# Patient Record
Sex: Male | Born: 1970 | Race: Black or African American | Hispanic: No | Smoking: Never smoker
Health system: Southern US, Community
[De-identification: ages and names within clinical notes are randomized; demographics above are authoritative.]

## PROBLEM LIST (undated history)

## (undated) DIAGNOSIS — R519 Headache, unspecified: Secondary | ICD-10-CM

## (undated) DIAGNOSIS — R569 Unspecified convulsions: Secondary | ICD-10-CM

## (undated) DIAGNOSIS — R51 Headache: Secondary | ICD-10-CM

## (undated) DIAGNOSIS — N529 Male erectile dysfunction, unspecified: Secondary | ICD-10-CM

## (undated) DIAGNOSIS — I10 Essential (primary) hypertension: Secondary | ICD-10-CM

## (undated) HISTORY — PX: NO PAST SURGERIES: SHX2092

---

## 1998-02-08 ENCOUNTER — Emergency Department (HOSPITAL_COMMUNITY): Admission: EM | Admit: 1998-02-08 | Discharge: 1998-02-08 | Payer: Self-pay | Admitting: Emergency Medicine

## 1998-03-31 ENCOUNTER — Emergency Department (HOSPITAL_COMMUNITY): Admission: EM | Admit: 1998-03-31 | Discharge: 1998-03-31 | Payer: Self-pay

## 1998-04-19 ENCOUNTER — Inpatient Hospital Stay (HOSPITAL_COMMUNITY): Admission: EM | Admit: 1998-04-19 | Discharge: 1998-04-20 | Payer: Self-pay | Admitting: Internal Medicine

## 1998-04-24 ENCOUNTER — Encounter: Admission: RE | Admit: 1998-04-24 | Discharge: 1998-07-23 | Payer: Self-pay | Admitting: Internal Medicine

## 1998-06-17 ENCOUNTER — Emergency Department (HOSPITAL_COMMUNITY): Admission: EM | Admit: 1998-06-17 | Discharge: 1998-06-17 | Payer: Self-pay | Admitting: Emergency Medicine

## 1999-11-01 ENCOUNTER — Encounter: Payer: Self-pay | Admitting: Emergency Medicine

## 1999-11-01 ENCOUNTER — Inpatient Hospital Stay (HOSPITAL_COMMUNITY): Admission: EM | Admit: 1999-11-01 | Discharge: 1999-11-05 | Payer: Self-pay | Admitting: Emergency Medicine

## 1999-11-02 ENCOUNTER — Encounter: Payer: Self-pay | Admitting: Family Medicine

## 2000-02-08 ENCOUNTER — Emergency Department (HOSPITAL_COMMUNITY): Admission: EM | Admit: 2000-02-08 | Discharge: 2000-02-08 | Payer: Self-pay | Admitting: Emergency Medicine

## 2000-03-23 ENCOUNTER — Encounter: Admission: RE | Admit: 2000-03-23 | Discharge: 2000-05-13 | Payer: Self-pay | Admitting: Orthopedic Surgery

## 2001-01-05 ENCOUNTER — Encounter: Admission: RE | Admit: 2001-01-05 | Discharge: 2001-04-05 | Payer: Self-pay | Admitting: Family Medicine

## 2001-04-23 ENCOUNTER — Emergency Department (HOSPITAL_COMMUNITY): Admission: EM | Admit: 2001-04-23 | Discharge: 2001-04-23 | Payer: Self-pay | Admitting: Emergency Medicine

## 2001-04-24 ENCOUNTER — Encounter: Payer: Self-pay | Admitting: Emergency Medicine

## 2001-04-24 ENCOUNTER — Emergency Department (HOSPITAL_COMMUNITY): Admission: EM | Admit: 2001-04-24 | Discharge: 2001-04-24 | Payer: Self-pay | Admitting: Emergency Medicine

## 2001-04-29 ENCOUNTER — Emergency Department (HOSPITAL_COMMUNITY): Admission: EM | Admit: 2001-04-29 | Discharge: 2001-04-30 | Payer: Self-pay | Admitting: Emergency Medicine

## 2001-09-04 ENCOUNTER — Emergency Department (HOSPITAL_COMMUNITY): Admission: EM | Admit: 2001-09-04 | Discharge: 2001-09-04 | Payer: Self-pay | Admitting: Emergency Medicine

## 2001-11-24 ENCOUNTER — Emergency Department (HOSPITAL_COMMUNITY): Admission: EM | Admit: 2001-11-24 | Discharge: 2001-11-24 | Payer: Self-pay | Admitting: Emergency Medicine

## 2002-11-23 ENCOUNTER — Emergency Department (HOSPITAL_COMMUNITY): Admission: EM | Admit: 2002-11-23 | Discharge: 2002-11-23 | Payer: Self-pay | Admitting: Emergency Medicine

## 2002-11-23 ENCOUNTER — Encounter: Payer: Self-pay | Admitting: Emergency Medicine

## 2004-12-11 ENCOUNTER — Encounter: Admission: RE | Admit: 2004-12-11 | Discharge: 2004-12-11 | Payer: Self-pay | Admitting: Occupational Medicine

## 2004-12-13 ENCOUNTER — Ambulatory Visit: Payer: Self-pay | Admitting: Family Medicine

## 2004-12-17 ENCOUNTER — Ambulatory Visit: Payer: Self-pay | Admitting: Family Medicine

## 2004-12-20 ENCOUNTER — Ambulatory Visit: Payer: Self-pay | Admitting: Family Medicine

## 2004-12-27 ENCOUNTER — Ambulatory Visit: Payer: Self-pay | Admitting: Family Medicine

## 2005-01-24 ENCOUNTER — Ambulatory Visit: Payer: Self-pay | Admitting: *Deleted

## 2005-01-31 ENCOUNTER — Ambulatory Visit: Payer: Self-pay | Admitting: Internal Medicine

## 2005-02-28 ENCOUNTER — Ambulatory Visit: Payer: Self-pay | Admitting: Family Medicine

## 2005-04-03 ENCOUNTER — Emergency Department (HOSPITAL_COMMUNITY): Admission: EM | Admit: 2005-04-03 | Discharge: 2005-04-03 | Payer: Self-pay | Admitting: Emergency Medicine

## 2005-07-15 ENCOUNTER — Ambulatory Visit: Payer: Self-pay | Admitting: Family Medicine

## 2005-07-17 ENCOUNTER — Encounter: Admission: RE | Admit: 2005-07-17 | Discharge: 2005-07-17 | Payer: Self-pay | Admitting: Occupational Medicine

## 2005-09-08 ENCOUNTER — Emergency Department (HOSPITAL_COMMUNITY): Admission: EM | Admit: 2005-09-08 | Discharge: 2005-09-08 | Payer: Self-pay | Admitting: *Deleted

## 2005-12-14 ENCOUNTER — Emergency Department (HOSPITAL_COMMUNITY): Admission: EM | Admit: 2005-12-14 | Discharge: 2005-12-14 | Payer: Self-pay | Admitting: Emergency Medicine

## 2006-03-30 ENCOUNTER — Ambulatory Visit: Payer: Self-pay | Admitting: Family Medicine

## 2006-08-19 ENCOUNTER — Ambulatory Visit: Payer: Self-pay | Admitting: Family Medicine

## 2006-09-10 ENCOUNTER — Ambulatory Visit: Payer: Self-pay | Admitting: Family Medicine

## 2007-03-01 ENCOUNTER — Ambulatory Visit: Payer: Self-pay | Admitting: Family Medicine

## 2007-04-08 ENCOUNTER — Ambulatory Visit: Payer: Self-pay | Admitting: Family Medicine

## 2007-07-14 ENCOUNTER — Encounter (INDEPENDENT_AMBULATORY_CARE_PROVIDER_SITE_OTHER): Payer: Self-pay | Admitting: *Deleted

## 2007-09-06 DIAGNOSIS — E1165 Type 2 diabetes mellitus with hyperglycemia: Secondary | ICD-10-CM

## 2007-09-06 DIAGNOSIS — E669 Obesity, unspecified: Secondary | ICD-10-CM | POA: Insufficient documentation

## 2007-09-06 DIAGNOSIS — I10 Essential (primary) hypertension: Secondary | ICD-10-CM

## 2007-09-06 DIAGNOSIS — G40909 Epilepsy, unspecified, not intractable, without status epilepticus: Secondary | ICD-10-CM

## 2007-10-04 ENCOUNTER — Encounter (INDEPENDENT_AMBULATORY_CARE_PROVIDER_SITE_OTHER): Payer: Self-pay | Admitting: Family Medicine

## 2007-10-04 ENCOUNTER — Ambulatory Visit: Payer: Self-pay | Admitting: Internal Medicine

## 2007-10-04 LAB — CONVERTED CEMR LAB
ALT: 37 units/L (ref 0–53)
Albumin: 4.3 g/dL (ref 3.5–5.2)
Alkaline Phosphatase: 74 units/L (ref 39–117)
Basophils Relative: 0 % (ref 0–1)
CO2: 25 meq/L (ref 19–32)
Calcium: 9.4 mg/dL (ref 8.4–10.5)
Cholesterol: 199 mg/dL (ref 0–200)
Eosinophils Absolute: 0 10*3/uL — ABNORMAL LOW (ref 0.2–0.7)
Glucose, Bld: 235 mg/dL — ABNORMAL HIGH (ref 70–99)
Lymphocytes Relative: 50 % — ABNORMAL HIGH (ref 12–46)
Monocytes Absolute: 0.3 10*3/uL (ref 0.1–1.0)
Monocytes Relative: 7 % (ref 3–12)
Potassium: 4.3 meq/L (ref 3.5–5.3)
RBC: 4.74 M/uL (ref 4.22–5.81)
RDW: 12.6 % (ref 11.5–15.5)
Sodium: 141 meq/L (ref 135–145)
Total CHOL/HDL Ratio: 5.2
Triglycerides: 181 mg/dL — ABNORMAL HIGH (ref <150)
VLDL: 36 mg/dL (ref 0–40)

## 2007-11-19 ENCOUNTER — Ambulatory Visit: Payer: Self-pay | Admitting: Internal Medicine

## 2008-02-02 ENCOUNTER — Ambulatory Visit: Payer: Self-pay | Admitting: Family Medicine

## 2008-02-02 ENCOUNTER — Encounter (INDEPENDENT_AMBULATORY_CARE_PROVIDER_SITE_OTHER): Payer: Self-pay | Admitting: Family Medicine

## 2008-02-02 LAB — CONVERTED CEMR LAB
AST: 22 units/L (ref 0–37)
Alkaline Phosphatase: 53 units/L (ref 39–117)
BUN: 12 mg/dL (ref 6–23)
Basophils Absolute: 0 10*3/uL (ref 0.0–0.1)
Basophils Relative: 0 % (ref 0–1)
Creatinine, Ser: 0.78 mg/dL (ref 0.40–1.50)
Eosinophils Absolute: 0.1 10*3/uL (ref 0.0–0.7)
Eosinophils Relative: 2 % (ref 0–5)
Lymphocytes Relative: 48 % — ABNORMAL HIGH (ref 12–46)
Lymphs Abs: 1.7 10*3/uL (ref 0.7–4.0)
MCV: 93.2 fL (ref 78.0–100.0)
Potassium: 4.6 meq/L (ref 3.5–5.3)
RDW: 13.1 % (ref 11.5–15.5)
Sodium: 141 meq/L (ref 135–145)
Total Bilirubin: 0.8 mg/dL (ref 0.3–1.2)
Total Protein: 7.8 g/dL (ref 6.0–8.3)

## 2008-11-26 ENCOUNTER — Emergency Department (HOSPITAL_COMMUNITY): Admission: EM | Admit: 2008-11-26 | Discharge: 2008-11-26 | Payer: Self-pay | Admitting: Emergency Medicine

## 2008-12-13 ENCOUNTER — Encounter (INDEPENDENT_AMBULATORY_CARE_PROVIDER_SITE_OTHER): Payer: Self-pay | Admitting: Adult Health

## 2008-12-13 ENCOUNTER — Ambulatory Visit: Payer: Self-pay | Admitting: Internal Medicine

## 2008-12-13 LAB — CONVERTED CEMR LAB
AST: 29 units/L (ref 0–37)
Albumin: 4.5 g/dL (ref 3.5–5.2)
Alkaline Phosphatase: 54 units/L (ref 39–117)
BUN: 10 mg/dL (ref 6–23)
Basophils Relative: 0 % (ref 0–1)
Chloride: 103 meq/L (ref 96–112)
Cholesterol: 146 mg/dL (ref 0–200)
Creatinine, Ser: 0.75 mg/dL (ref 0.40–1.50)
HCT: 41.9 % (ref 39.0–52.0)
Hemoglobin: 14.3 g/dL (ref 13.0–17.0)
LDL Cholesterol: 80 mg/dL (ref 0–99)
Lymphocytes Relative: 43 % (ref 12–46)
Monocytes Absolute: 0.4 10*3/uL (ref 0.1–1.0)
Neutro Abs: 1.5 10*3/uL — ABNORMAL LOW (ref 1.7–7.7)
Neutrophils Relative %: 43 % (ref 43–77)
Platelets: 271 10*3/uL (ref 150–400)
Potassium: 3.8 meq/L (ref 3.5–5.3)
RDW: 13.9 % (ref 11.5–15.5)
Total Bilirubin: 0.7 mg/dL (ref 0.3–1.2)
Total CHOL/HDL Ratio: 3.2
Triglycerides: 99 mg/dL (ref <150)
VLDL: 20 mg/dL (ref 0–40)
WBC: 3.3 10*3/uL — ABNORMAL LOW (ref 4.0–10.5)

## 2008-12-22 ENCOUNTER — Emergency Department (HOSPITAL_COMMUNITY): Admission: EM | Admit: 2008-12-22 | Discharge: 2008-12-22 | Payer: Self-pay | Admitting: *Deleted

## 2008-12-26 ENCOUNTER — Ambulatory Visit: Payer: Self-pay | Admitting: Internal Medicine

## 2009-06-14 ENCOUNTER — Encounter (INDEPENDENT_AMBULATORY_CARE_PROVIDER_SITE_OTHER): Payer: Self-pay | Admitting: Adult Health

## 2009-06-14 ENCOUNTER — Ambulatory Visit: Payer: Self-pay | Admitting: Family Medicine

## 2009-06-14 LAB — CONVERTED CEMR LAB
AST: 17 units/L (ref 0–37)
Albumin: 4.2 g/dL (ref 3.5–5.2)
Alkaline Phosphatase: 50 units/L (ref 39–117)
BUN: 13 mg/dL (ref 6–23)
Chloride: 100 meq/L (ref 96–112)
Cholesterol: 131 mg/dL (ref 0–200)
Creatinine, Ser: 0.68 mg/dL (ref 0.40–1.50)
HCT: 39 % (ref 39.0–52.0)
LDL Cholesterol: 61 mg/dL (ref 0–99)
Lymphs Abs: 1.8 10*3/uL (ref 0.7–4.0)
MCHC: 34.4 g/dL (ref 30.0–36.0)
MCV: 91.5 fL (ref 78.0–100.0)
Microalb, Ur: 1.6 mg/dL (ref 0.00–1.89)
Neutro Abs: 1.7 10*3/uL (ref 1.7–7.7)
Neutrophils Relative %: 44 % (ref 43–77)
Platelets: 240 10*3/uL (ref 150–400)
Potassium: 3.6 meq/L (ref 3.5–5.3)
Total CHOL/HDL Ratio: 3.2
VLDL: 29 mg/dL (ref 0–40)
WBC: 3.9 10*3/uL — ABNORMAL LOW (ref 4.0–10.5)

## 2010-01-07 ENCOUNTER — Emergency Department (HOSPITAL_COMMUNITY): Admission: EM | Admit: 2010-01-07 | Discharge: 2010-01-07 | Payer: Self-pay | Admitting: Emergency Medicine

## 2010-01-11 ENCOUNTER — Emergency Department (HOSPITAL_COMMUNITY): Admission: EM | Admit: 2010-01-11 | Discharge: 2010-01-12 | Payer: Self-pay | Admitting: Emergency Medicine

## 2010-03-22 ENCOUNTER — Encounter (INDEPENDENT_AMBULATORY_CARE_PROVIDER_SITE_OTHER): Payer: Self-pay | Admitting: Adult Health

## 2010-03-22 ENCOUNTER — Ambulatory Visit: Payer: Self-pay | Admitting: Internal Medicine

## 2010-03-22 LAB — CONVERTED CEMR LAB
Albumin: 4.2 g/dL (ref 3.5–5.2)
Alkaline Phosphatase: 63 units/L (ref 39–117)
BUN: 9 mg/dL (ref 6–23)
Calcium: 9.8 mg/dL (ref 8.4–10.5)
Creatinine, Ser: 0.67 mg/dL (ref 0.40–1.50)
HDL: 33 mg/dL — ABNORMAL LOW (ref >39)
LDL Cholesterol: 44 mg/dL (ref 0–99)
Microalb, Ur: 0.5 mg/dL (ref 0.00–1.89)
Phenytoin Lvl: 3.6 ug/mL — ABNORMAL LOW (ref 10.0–20.0)
Total CHOL/HDL Ratio: 3.9
Total Protein: 7.1 g/dL (ref 6.0–8.3)
Triglycerides: 266 mg/dL — ABNORMAL HIGH (ref <150)
Vit D, 25-Hydroxy: 20 ng/mL — ABNORMAL LOW (ref 30–89)

## 2010-04-09 ENCOUNTER — Ambulatory Visit: Payer: Self-pay | Admitting: Internal Medicine

## 2010-05-22 ENCOUNTER — Ambulatory Visit: Payer: Self-pay | Admitting: Internal Medicine

## 2010-10-16 ENCOUNTER — Emergency Department (HOSPITAL_COMMUNITY)
Admission: EM | Admit: 2010-10-16 | Discharge: 2010-10-16 | Payer: Self-pay | Source: Home / Self Care | Admitting: Emergency Medicine

## 2011-01-20 LAB — CBC
MCV: 93.9 fL (ref 78.0–100.0)
Platelets: 253 10*3/uL (ref 150–400)
RBC: 4.36 MIL/uL (ref 4.22–5.81)
RDW: 12.4 % (ref 11.5–15.5)
WBC: 4 10*3/uL (ref 4.0–10.5)

## 2011-01-20 LAB — DIFFERENTIAL
Lymphs Abs: 1.8 10*3/uL (ref 0.7–4.0)
Monocytes Absolute: 0.3 10*3/uL (ref 0.1–1.0)
Monocytes Relative: 8 % (ref 3–12)
Neutro Abs: 1.8 10*3/uL (ref 1.7–7.7)
Neutrophils Relative %: 45 % (ref 43–77)

## 2011-01-20 LAB — BASIC METABOLIC PANEL
BUN: 14 mg/dL (ref 6–23)
Chloride: 96 mEq/L (ref 96–112)
Creatinine, Ser: 0.79 mg/dL (ref 0.4–1.5)
GFR calc Af Amer: >60 mL/min (ref >60)
GFR calc non Af Amer: >60 mL/min (ref >60)
Potassium: 4.6 mEq/L (ref 3.5–5.1)

## 2011-01-20 LAB — BLOOD GAS, VENOUS
Acid-Base Excess: 2.6 mmol/L — ABNORMAL HIGH (ref 0.0–2.0)
O2 Saturation: 7.4 %
Patient temperature: 98.6
TCO2: 27.4 mmol/L (ref 0–100)
pCO2, Ven: 59.8 mmHg — ABNORMAL HIGH (ref 45.0–50.0)
pH, Ven: 7.321 — ABNORMAL HIGH (ref 7.250–7.300)
pO2, Ven: 11.9 mmHg — CL (ref 30.0–45.0)

## 2011-01-20 LAB — URINE CULTURE: Colony Count: NO GROWTH

## 2011-01-20 LAB — GLUCOSE, CAPILLARY
Glucose-Capillary: 247 mg/dL — ABNORMAL HIGH (ref 70–99)
Glucose-Capillary: 292 mg/dL — ABNORMAL HIGH (ref 70–99)
Glucose-Capillary: 392 mg/dL — ABNORMAL HIGH (ref 70–99)
Glucose-Capillary: 559 mg/dL (ref 70–99)

## 2011-01-20 LAB — URINALYSIS, ROUTINE W REFLEX MICROSCOPIC
Bilirubin Urine: NEGATIVE
Glucose, UA: >1000 mg/dL — AB
Glucose, UA: >1000 mg/dL — AB
Hgb urine dipstick: NEGATIVE
Hgb urine dipstick: NEGATIVE
Ketones, ur: 40 mg/dL — AB
Nitrite: NEGATIVE
Protein, ur: NEGATIVE mg/dL
Protein, ur: NEGATIVE mg/dL
Specific Gravity, Urine: 1.041 — ABNORMAL HIGH (ref 1.005–1.030)
Specific Gravity, Urine: >1.046 — ABNORMAL HIGH (ref 1.005–1.030)
Urobilinogen, UA: 0.2 mg/dL (ref 0.0–1.0)
Urobilinogen, UA: 0.2 mg/dL (ref 0.0–1.0)
pH: 5 (ref 5.0–8.0)
pH: 5.5 (ref 5.0–8.0)

## 2011-01-20 LAB — URINE MICROSCOPIC-ADD ON: Urine-Other: NONE SEEN

## 2011-01-20 LAB — POCT I-STAT, CHEM 8: Hemoglobin: 15.3 g/dL (ref 13.0–17.0)

## 2011-02-10 LAB — GLUCOSE, CAPILLARY

## 2011-02-10 LAB — KOH PREP: KOH Prep: NONE SEEN

## 2011-02-11 LAB — POCT I-STAT, CHEM 8
Calcium, Ion: 1.08 mmol/L — ABNORMAL LOW (ref 1.12–1.32)
Creatinine, Ser: 0.8 mg/dL (ref 0.4–1.5)
Glucose, Bld: 380 mg/dL — ABNORMAL HIGH (ref 70–99)
HCT: 49 % (ref 39.0–52.0)
Hemoglobin: 16.7 g/dL (ref 13.0–17.0)
TCO2: 28 mmol/L (ref 0–100)

## 2011-02-11 LAB — CBC
HCT: 46 % (ref 39.0–52.0)
MCHC: 34.6 g/dL (ref 30.0–36.0)
Platelets: 258 10*3/uL (ref 150–400)
RDW: 12.3 % (ref 11.5–15.5)

## 2011-02-11 LAB — DIFFERENTIAL
Basophils Absolute: 0 10*3/uL (ref 0.0–0.1)
Basophils Relative: 1 % (ref 0–1)
Eosinophils Relative: 1 % (ref 0–5)
Lymphocytes Relative: 53 % — ABNORMAL HIGH (ref 12–46)

## 2011-02-11 LAB — URINALYSIS, ROUTINE W REFLEX MICROSCOPIC
Bilirubin Urine: NEGATIVE
Ketones, ur: NEGATIVE mg/dL
Leukocytes, UA: NEGATIVE
Nitrite: NEGATIVE
Protein, ur: NEGATIVE mg/dL
Urobilinogen, UA: 1 mg/dL (ref 0.0–1.0)
pH: 6.5 (ref 5.0–8.0)

## 2011-02-11 LAB — KETONES, QUALITATIVE: Acetone, Bld: NEGATIVE

## 2011-03-14 NOTE — Consult Note (Signed)
NAME:  Albert Garcia, Albert Garcia NO.:  [id number]   MEDICAL RECORD NO.:  [id number]          PATIENT TYPE:  EMS   LOCATION:  ED                           FACILITY:  [facility]   PHYSICIAN:  Lindaann Slough, M.D.  DATE OF BIRTH:  1971-09-16   DATE OF CONSULTATION:  04/03/2005  DATE OF DISCHARGE:                                   CONSULTATION   REASON FOR CONSULTATION:  Left testicular pain.   The patient is a 40 year old male who has been complaining of left  testicular pain on and off for the past 4 days. The pain went away and then  it recurred 2 days ago and it was painful this morning and he went to the  emergency room for evaluation. A testicular ultrasound showed no evidence of  torsion and normal testes and epididymides. He denies any voiding symptoms.  He has no frequency, urgency, hesitancy, or straining with urination. He has  no urethral discharge.   PAST MEDICAL HISTORY:  Positive for diabetes.   FAMILY HISTORY:  Positive for heart disease, diabetes.   SOCIAL HISTORY:  He does not smoke nor drink.   MEDICATIONS:  1.  Glucotrol 1000 mg twice a day.  2.  Carbamazepine 200 mg twice a day.   ALLERGIES:  No known drug allergies.   REVIEW OF SYSTEMS:  He complains of left testicular pain and everything else  is negative.   PHYSICAL EXAMINATION:  GENERAL:  This is a well-built 40 year old male who  is complaining of left testicular pain but he is in no acute distress.  VITAL SIGNS:  Blood pressure is 126/88, pulse 82, respirations 20,  temperature 97.4.  ABDOMEN:  Soft and nondistended, nontender. He has no CVA tenderness. The  kidneys are not palpable. He has no hepatosplenomegaly, no splenomegaly. The  bladder is not distended. He has no inguinal hernia.  GENITOURINARY:  The penis is circumcised, meatus is normal. Scrotum is  normal. He has no hydrocele, no testicular mass. There is no testicular  tenderness at this time. The epididymides both right and left are  normal and  nontender. Cords are normal. There is no evidence of inguinal hernia and  anal and perineal skin is normal.   Urinalysis shows no rbc's or wbc's, 0.2 mg of urobilinogen.   Testicular ultrasound showed no evidence of torsion and normal epididymis.   IMPRESSION:  Possible intermittent torsion.   PLAN:  Ibuprofen 800 mg three times a day and follow up in the office in 1  week.       MN/MEDQ  D:  04/03/2005  T:  04/03/2005  Job:  [id number]

## 2011-08-18 ENCOUNTER — Emergency Department (HOSPITAL_COMMUNITY)
Admission: EM | Admit: 2011-08-18 | Discharge: 2011-08-18 | Disposition: A | Discharge status: Home / Self Care | Payer: Worker's Compensation | Attending: Emergency Medicine | Admitting: Emergency Medicine

## 2011-08-18 ENCOUNTER — Emergency Department (HOSPITAL_COMMUNITY): Payer: Worker's Compensation

## 2011-08-18 DIAGNOSIS — E119 Type 2 diabetes mellitus without complications: Secondary | ICD-10-CM | POA: Insufficient documentation

## 2011-08-18 DIAGNOSIS — I10 Essential (primary) hypertension: Secondary | ICD-10-CM | POA: Insufficient documentation

## 2011-08-18 DIAGNOSIS — S20219A Contusion of unspecified front wall of thorax, initial encounter: Secondary | ICD-10-CM | POA: Insufficient documentation

## 2011-08-18 DIAGNOSIS — G40909 Epilepsy, unspecified, not intractable, without status epilepticus: Secondary | ICD-10-CM | POA: Insufficient documentation

## 2011-08-18 DIAGNOSIS — Y990 Civilian activity done for income or pay: Secondary | ICD-10-CM | POA: Insufficient documentation

## 2011-08-18 DIAGNOSIS — Y9269 Other specified industrial and construction area as the place of occurrence of the external cause: Secondary | ICD-10-CM | POA: Insufficient documentation

## 2011-08-18 DIAGNOSIS — W319XXA Contact with unspecified machinery, initial encounter: Secondary | ICD-10-CM | POA: Insufficient documentation

## 2011-08-18 DIAGNOSIS — Z794 Long term (current) use of insulin: Secondary | ICD-10-CM | POA: Insufficient documentation

## 2012-06-10 ENCOUNTER — Emergency Department (HOSPITAL_BASED_OUTPATIENT_CLINIC_OR_DEPARTMENT_OTHER)
Admission: EM | Admit: 2012-06-10 | Discharge: 2012-06-10 | Disposition: A | Discharge status: Home / Self Care | Payer: Self-pay | Attending: Emergency Medicine | Admitting: Emergency Medicine

## 2012-06-10 ENCOUNTER — Encounter (HOSPITAL_BASED_OUTPATIENT_CLINIC_OR_DEPARTMENT_OTHER): Payer: Self-pay | Admitting: Emergency Medicine

## 2012-06-10 DIAGNOSIS — IMO0001 Reserved for inherently not codable concepts without codable children: Secondary | ICD-10-CM

## 2012-06-10 DIAGNOSIS — Z794 Long term (current) use of insulin: Secondary | ICD-10-CM | POA: Insufficient documentation

## 2012-06-10 DIAGNOSIS — E119 Type 2 diabetes mellitus without complications: Secondary | ICD-10-CM | POA: Insufficient documentation

## 2012-06-10 DIAGNOSIS — L03019 Cellulitis of unspecified finger: Secondary | ICD-10-CM | POA: Insufficient documentation

## 2012-06-10 HISTORY — DX: Unspecified convulsions: R56.9

## 2012-06-10 MED ORDER — LIDOCAINE HCL 2 % IJ SOLN
10.0000 mL | Freq: Once | INTRAMUSCULAR | Status: AC
  Administered 2012-06-10: 200 mg
  Filled 2012-06-10: qty 1

## 2012-06-10 NOTE — ED Notes (Signed)
Pt soaking index finger in warm soapy water.

## 2012-06-10 NOTE — ED Provider Notes (Signed)
History     CSN: [id number]  Arrival date & time 06/10/12  0810   First MD Initiated Contact with Patient 06/10/12 [phone]      Chief Complaint  Patient presents with  . Hand Pain    (Consider location/radiation/quality/duration/timing/severity/associated sxs/prior treatment) HPI Patient is a 41 year old male with history of diabetes who presents today complaining of pain along the cuticle of his right index finger. Patient noted this 7 days ago. He's had increasing pain and swelling with this. Patient has no prior history of cellulitis or boils. He does not know his blood sugar as he has not checked it for about a week. Patient reports that he has been compliant with his medications. He denies any nausea, vomiting, or fevers. Pain is a 6/10. It is worse with palpation and flexion at the affected fingers DIPs. Patient is a former Secretary/administrator. There are no other associated or modifying factors. Past Medical History  Diagnosis Date  . Diabetes mellitus   . Seizures     History reviewed. No pertinent past surgical history.  History reviewed. No pertinent family history.  History  Substance Use Topics  . Smoking status: Never Smoker   . Smokeless tobacco: Not on file  . Alcohol Use: No      Review of Systems  Constitutional: Negative.   HENT: Negative.   Eyes: Negative.   Respiratory: Negative.   Cardiovascular: Negative.   Gastrointestinal: Negative.   Genitourinary: Negative.   Musculoskeletal:       Right index finger pain  Skin: Negative.   Neurological: Negative.   Hematological: Negative.   Psychiatric/Behavioral: Negative.   All other systems reviewed and are negative.    Allergies  Review of patient's allergies indicates no known allergies.  Home Medications   Current Outpatient Rx  Name Route Sig Dispense Refill  . INSULIN ASPART 100 UNIT/ML [place] SOLN Subcutaneous Inject into the skin 3 (three) times daily before meals.    . INSULIN GLARGINE 100 UNIT/ML [place]  SOLN Subcutaneous Inject 20 Units into the skin at bedtime.    Marland Kitchen METFORMIN HCL 1000 MG PO TABS Oral Take 1,000 mg by mouth 2 (two) times daily with a meal.    . PHENYTOIN SODIUM EXTENDED 100 MG PO CAPS Oral Take 100 mg by mouth 3 (three) times daily.      BP 125/80  Pulse 81  Temp 97.9 F (36.6 C) (Oral)  Resp 16  SpO2 100%  Physical Exam  Nursing note and vitals reviewed. GEN: Well-developed, well-nourished male in no distress HEENT: Atraumatic, normocephalic. Oropharynx clear without erythema EYES: PERRLA BL, no scleral icterus. Neuro: cranial nerves grossly 2-12 intact, no abnormalities of strength or sensation, A and O x 3 MSK: Patient moves all 4 extremities symmetrically, no deformity, edema, or injury noted Skin: Over the proximal nail fold of the right index finger patient has discoloration along the cuticle with obvious swelling consistent with paronychia. Patient has no tenderness over the fingertip pad or extension to suggest felon. No other rashes, petechia, or jaundice  Psych: no abnormality of mood   ED Course  Procedures (including critical care time)  INCISION AND DRAINAGE Performed by: Cyndra Numbers Consent: Verbal consent obtained. Risks and benefits: risks, benefits and alternatives were discussed Type: abscess  Body area: Paronychia of the proximal nail fold of the right index finger  Anesthesia: Digital nerve block  Local anesthetic: lidocaine 2% without epinephrine  Anesthetic total: 3 ml  Complexity: Simple Significant pressure over the entire wound to  break up loculations Drainage: purulent  Drainage amount: 1 cc   Packing material: None  Patient tolerance: Patient tolerated the procedure well with no immediate complications.patient cleansed with chlorhexidine and soaked in warm soapy water with direct pressure applied by nursing over the affected area for 10 minutes following procedure.     Labs Reviewed  GLUCOSE, CAPILLARY - Abnormal;  Notable for the following:    Glucose-Capillary 176 (*)     All other components within normal limits   No results found.   1. Paronychia of second finger, right       MDM  Patient presented with paronychia of the right index finger. He is diabetic and has not been checking his sugars. Fingerstick was 176 today. Patient was notified of finding. Patient had incision and drainage performed. Patient was advised to perform warm soapy water soaks twice a day for 10 minutes apiece until symptoms resolved. No MRI if indicated this patient had no associated cellulitis of this. Patient advised to use ice, elevate, and use over-the-counter medications to treat pain and swelling. Patient discharged in good condition.       Cyndra Numbers, MD 06/10/12 [phone]

## 2012-06-10 NOTE — ED Notes (Signed)
Pt c/o right index finger swelling. Pt states he stopped biting his finger nails a month ago and noticed this swelling starting a week ago. Pt denies fever, nausea, vomiting. Pt states pain when touched at tip of finger.

## 2012-06-10 NOTE — ED Notes (Signed)
Pt sent home with gauze dressing on right index finger, discharge instructions reviewed, and ice pack

## 2012-06-26 ENCOUNTER — Emergency Department (HOSPITAL_BASED_OUTPATIENT_CLINIC_OR_DEPARTMENT_OTHER)
Admission: EM | Admit: 2012-06-26 | Discharge: 2012-06-26 | Disposition: A | Discharge status: Home / Self Care | Payer: Self-pay | Attending: Emergency Medicine | Admitting: Emergency Medicine

## 2012-06-26 ENCOUNTER — Encounter (HOSPITAL_BASED_OUTPATIENT_CLINIC_OR_DEPARTMENT_OTHER): Payer: Self-pay | Admitting: *Deleted

## 2012-06-26 ENCOUNTER — Emergency Department (HOSPITAL_BASED_OUTPATIENT_CLINIC_OR_DEPARTMENT_OTHER): Payer: Self-pay

## 2012-06-26 DIAGNOSIS — Y990 Civilian activity done for income or pay: Secondary | ICD-10-CM | POA: Insufficient documentation

## 2012-06-26 DIAGNOSIS — E119 Type 2 diabetes mellitus without complications: Secondary | ICD-10-CM | POA: Insufficient documentation

## 2012-06-26 DIAGNOSIS — W208XXA Other cause of strike by thrown, projected or falling object, initial encounter: Secondary | ICD-10-CM | POA: Insufficient documentation

## 2012-06-26 DIAGNOSIS — S93602A Unspecified sprain of left foot, initial encounter: Secondary | ICD-10-CM

## 2012-06-26 DIAGNOSIS — S93609A Unspecified sprain of unspecified foot, initial encounter: Secondary | ICD-10-CM | POA: Insufficient documentation

## 2012-06-26 MED ORDER — NAPROXEN 250 MG PO TABS
500.0000 mg | ORAL_TABLET | Freq: Once | ORAL | Status: AC
  Administered 2012-06-26: 500 mg via ORAL
  Filled 2012-06-26: qty 2

## 2012-06-26 MED ORDER — NAPROXEN SODIUM 550 MG PO TABS: ORAL_TABLET | ORAL | Status: DC

## 2012-06-26 NOTE — ED Notes (Signed)
Pt questioned whether he needs a UDS for his workplace. Pt states he was not sent with any paperwork, and pt attempted to contact his employer while in triage. No answer from workplace. Pt aware to notify staff once he knows whether or not he needs a UDS performed.

## 2012-06-26 NOTE — ED Provider Notes (Signed)
History     CSN: [id number]  Arrival date & time 06/26/12  0201   First MD Initiated Contact with Patient 06/26/12 0216      Chief Complaint  Patient presents with  . Foot Injury    (Consider location/radiation/quality/duration/timing/severity/associated sxs/prior treatment) HPI This is a 41 year old black male who was at work about an hour and half ago and about shampoo fell from a rack onto his left foot. He is having moderate pain in the dorsal aspect of the left foot. There is no associated deformity or swelling. The pain is worse with ambulation or palpation. He denies other injury. He is able to ambulate.  Past Medical History  Diagnosis Date  . Diabetes mellitus   . Seizures     History reviewed. No pertinent past surgical history.  No family history on file.  History  Substance Use Topics  . Smoking status: Never Smoker   . Smokeless tobacco: Not on file  . Alcohol Use: No      Review of Systems  All other systems reviewed and are negative.    Allergies  Review of patient's allergies indicates no known allergies.  Home Medications   Current Outpatient Rx  Name Route Sig Dispense Refill  . INSULIN ASPART 100 UNIT/ML [place] SOLN Subcutaneous Inject into the skin 3 (three) times daily before meals.    . INSULIN GLARGINE 100 UNIT/ML [place] SOLN Subcutaneous Inject 20 Units into the skin at bedtime.    Marland Kitchen METFORMIN HCL 1000 MG PO TABS Oral Take 1,000 mg by mouth 2 (two) times daily with a meal.    . PHENYTOIN SODIUM EXTENDED 100 MG PO CAPS Oral Take 100 mg by mouth 3 (three) times daily.      BP 114/79  Pulse 90  Temp 98.4 F (36.9 C) (Oral)  Resp 18  Ht 5\' 8"  (1.727 m)  Wt 239 lb (108.41 kg)  BMI 36.34 kg/m2  SpO2 98%  Physical Exam General: Well-developed, well-nourished male in no acute distress; appearance consistent with age of record HENT: normocephalic, atraumatic Eyes: Normal appearance Neck: supple Heart: regular rate and rhythm Lungs: Normal  respiratory effort and excursion Abdomen: soft; nondistended Extremities: No deformity; tenderness over dorsal left foot without deformity, swelling or ecchymosis, pulses normal Neurologic: Awake, alert and oriented; motor function intact in all extremities and symmetric; no facial droop Skin: Warm and dry Psychiatric: Normal mood and affect    ED Course  Procedures (including critical care time)    MDM   Nursing notes and vitals signs, including pulse oximetry, reviewed.  Summary of this visit's results, reviewed by myself:  Imaging Studies: Dg Foot Complete Left  06/26/2012  *RADIOLOGY REPORT*  Clinical Data: Foot injury  LEFT FOOT - COMPLETE 3+ VIEW  Comparison: None  Findings: There is no evidence of fracture or dislocation.  There is no evidence of arthropathy or other focal bone abnormality. Soft tissues are unremarkable.  IMPRESSION: Negative exam.   Original Report Authenticated By: Rosealee Albee, M.D.             Hanley Seamen, MD 06/26/12 [phone]

## 2012-06-26 NOTE — ED Notes (Signed)
Pt states he still does not know if he needs a UDS, and he will f/u with his employee regarding.

## 2012-06-26 NOTE — ED Notes (Signed)
Pt was at work tonight when a box fell from a rack, landing on the pt's left foot.

## 2012-08-01 ENCOUNTER — Emergency Department (HOSPITAL_BASED_OUTPATIENT_CLINIC_OR_DEPARTMENT_OTHER)
Admission: EM | Admit: 2012-08-01 | Discharge: 2012-08-01 | Disposition: A | Discharge status: Home / Self Care | Payer: Self-pay | Attending: Emergency Medicine | Admitting: Emergency Medicine

## 2012-08-01 ENCOUNTER — Encounter (HOSPITAL_BASED_OUTPATIENT_CLINIC_OR_DEPARTMENT_OTHER): Payer: Self-pay | Admitting: *Deleted

## 2012-08-01 DIAGNOSIS — E119 Type 2 diabetes mellitus without complications: Secondary | ICD-10-CM | POA: Insufficient documentation

## 2012-08-01 DIAGNOSIS — G40909 Epilepsy, unspecified, not intractable, without status epilepticus: Secondary | ICD-10-CM | POA: Insufficient documentation

## 2012-08-01 DIAGNOSIS — L0211 Cutaneous abscess of neck: Secondary | ICD-10-CM | POA: Insufficient documentation

## 2012-08-01 DIAGNOSIS — L03818 Cellulitis of other sites: Secondary | ICD-10-CM | POA: Insufficient documentation

## 2012-08-01 DIAGNOSIS — Z794 Long term (current) use of insulin: Secondary | ICD-10-CM | POA: Insufficient documentation

## 2012-08-01 DIAGNOSIS — L02818 Cutaneous abscess of other sites: Secondary | ICD-10-CM | POA: Insufficient documentation

## 2012-08-01 MED ORDER — IBUPROFEN 600 MG PO TABS: 600.0000 mg | ORAL_TABLET | Freq: Four times a day (QID) | ORAL | Status: DC | PRN

## 2012-08-01 MED ORDER — TRAMADOL HCL 50 MG PO TABS: 50.0000 mg | ORAL_TABLET | Freq: Four times a day (QID) | ORAL | Status: DC | PRN

## 2012-08-01 MED ORDER — SULFAMETHOXAZOLE-TRIMETHOPRIM 800-160 MG PO TABS: 1.0000 | ORAL_TABLET | Freq: Two times a day (BID) | ORAL | Status: DC

## 2012-08-01 MED ORDER — CEPHALEXIN 500 MG PO CAPS: 500.0000 mg | ORAL_CAPSULE | Freq: Two times a day (BID) | ORAL | Status: DC

## 2012-08-01 MED ORDER — IBUPROFEN 800 MG PO TABS
800.0000 mg | ORAL_TABLET | Freq: Once | ORAL | Status: AC
  Administered 2012-08-01: 800 mg via ORAL
  Filled 2012-08-01: qty 1

## 2012-08-01 MED ORDER — IBUPROFEN 600 MG PO TABS
600.0000 mg | ORAL_TABLET | Freq: Four times a day (QID) | ORAL | Status: DC | PRN
Start: 2012-08-01 — End: 2012-08-01

## 2012-08-01 NOTE — ED Notes (Signed)
Patient c/o HA for the past three days, has been taking 650 mg tylenoll but no relief, last taken at 23:00 last night. No n/v.  Also c/o cyst on back of neck that came up X 3 days ago, tender to touch

## 2012-08-01 NOTE — ED Provider Notes (Addendum)
History     CSN: [id number]  Arrival date & time 08/01/12  4132   First MD Initiated Contact with Patient 08/01/12 [phone]      Chief Complaint  Patient presents with  . Headache    (Consider location/radiation/quality/duration/timing/severity/associated sxs/prior treatment) HPI Pt with abscess to R occipital region of scalp with purulent drainage x 3 days. Has had increasing pain around site to neck. No fever chills. No focal neuro deficits. Has been taking tylenol at home.  Past Medical History  Diagnosis Date  . Diabetes mellitus   . Seizures     History reviewed. No pertinent past surgical history.  History reviewed. No pertinent family history.  History  Substance Use Topics  . Smoking status: Never Smoker   . Smokeless tobacco: Not on file  . Alcohol Use: Yes      Review of Systems  Constitutional: Negative for fever and chills.  HENT: Positive for neck pain. Negative for neck stiffness.   Gastrointestinal: Negative for nausea and vomiting.  Skin: Positive for rash and wound.  Neurological: Positive for headaches. Negative for seizures, weakness, light-headedness and numbness.    Allergies  Review of patient's allergies indicates no known allergies.  Home Medications   Current Outpatient Rx  Name Route Sig Dispense Refill  . CEPHALEXIN 500 MG PO CAPS Oral Take 1 capsule (500 mg total) by mouth 2 (two) times daily. 20 capsule 0  . IBUPROFEN 600 MG PO TABS Oral Take 1 tablet (600 mg total) by mouth every 6 (six) hours as needed for pain. 30 tablet 0  . INSULIN ASPART 100 UNIT/ML [place] SOLN Subcutaneous Inject into the skin 3 (three) times daily before meals.    . INSULIN GLARGINE 100 UNIT/ML [place] SOLN Subcutaneous Inject 20 Units into the skin at bedtime.    Marland Kitchen METFORMIN HCL 1000 MG PO TABS Oral Take 1,000 mg by mouth 2 (two) times daily with a meal.    . NAPROXEN SODIUM 550 MG PO TABS  Take 1 twice a day as needed for pain. Best taken with a meal. 30 tablet 0  .  PHENYTOIN SODIUM EXTENDED 100 MG PO CAPS Oral Take 100 mg by mouth 3 (three) times daily.    . SULFAMETHOXAZOLE-TRIMETHOPRIM 800-160 MG PO TABS Oral Take 1 tablet by mouth 2 (two) times daily. 20 tablet 0  . TRAMADOL HCL 50 MG PO TABS Oral Take 1 tablet (50 mg total) by mouth every 6 (six) hours as needed for pain. 15 tablet 0    BP 123/71  Pulse 91  Temp 98.1 F (36.7 C) (Oral)  Resp 19  SpO2 96%  Physical Exam  Nursing note and vitals reviewed. Constitutional: He is oriented to person, place, and time. He appears well-developed and well-nourished. No distress.  HENT:  Head: Atraumatic.  Mouth/Throat: Oropharynx is clear and moist.       1 cm follicular abscess without drainage or fluctuance located on R occipitall region of the scalp. Mild surrounding warmth and induration.   Eyes: EOM are normal. Pupils are equal, round, and reactive to light.  Neck: Normal range of motion. Neck supple.       No meningismus   Cardiovascular: Normal rate and regular rhythm.   Pulmonary/Chest: Effort normal and breath sounds normal. No respiratory distress. He has no wheezes. He has no rales.  Abdominal: Soft. Bowel sounds are normal. There is no tenderness. There is no rebound.  Musculoskeletal: Normal range of motion. He exhibits no edema and no tenderness.  Neurological: He is alert and oriented to person, place, and time. Abnormal muscle tone: move all ext without weakness, normal sensation, ambulating without assistance.  Skin: Skin is warm and dry. Rash noted. There is erythema.       See HENT exam  Psychiatric: He has a normal mood and affect. His behavior is normal.    ED Course  Procedures (including critical care time)  Labs Reviewed  GLUCOSE, CAPILLARY - Abnormal; Notable for the following:    Glucose-Capillary 237 (*)     All other components within normal limits   No results found.   1. Cellulitis and abscess of neck       MDM  Abscess recently drained. No fluctuance.  Doe snot require I&D presently. Will treat with abx. If worsens, pt instructed to return immediately.         Loren Racer, MD 08/01/12 1610  Loren Racer, MD 08/01/12 [phone]

## 2012-12-26 ENCOUNTER — Emergency Department (HOSPITAL_BASED_OUTPATIENT_CLINIC_OR_DEPARTMENT_OTHER): Payer: Self-pay

## 2012-12-26 ENCOUNTER — Emergency Department (HOSPITAL_BASED_OUTPATIENT_CLINIC_OR_DEPARTMENT_OTHER)
Admission: EM | Admit: 2012-12-26 | Discharge: 2012-12-26 | Disposition: A | Discharge status: Home / Self Care | Payer: Self-pay | Attending: Emergency Medicine | Admitting: Emergency Medicine

## 2012-12-26 ENCOUNTER — Encounter (HOSPITAL_BASED_OUTPATIENT_CLINIC_OR_DEPARTMENT_OTHER): Payer: Self-pay

## 2012-12-26 DIAGNOSIS — R51 Headache: Secondary | ICD-10-CM | POA: Insufficient documentation

## 2012-12-26 DIAGNOSIS — Z8669 Personal history of other diseases of the nervous system and sense organs: Secondary | ICD-10-CM | POA: Insufficient documentation

## 2012-12-26 DIAGNOSIS — G4700 Insomnia, unspecified: Secondary | ICD-10-CM | POA: Insufficient documentation

## 2012-12-26 DIAGNOSIS — I10 Essential (primary) hypertension: Secondary | ICD-10-CM | POA: Insufficient documentation

## 2012-12-26 DIAGNOSIS — Z794 Long term (current) use of insulin: Secondary | ICD-10-CM | POA: Insufficient documentation

## 2012-12-26 DIAGNOSIS — E119 Type 2 diabetes mellitus without complications: Secondary | ICD-10-CM | POA: Insufficient documentation

## 2012-12-26 DIAGNOSIS — Z760 Encounter for issue of repeat prescription: Secondary | ICD-10-CM | POA: Insufficient documentation

## 2012-12-26 HISTORY — DX: Essential (primary) hypertension: I10

## 2012-12-26 LAB — CBC WITH DIFFERENTIAL/PLATELET
Basophils Absolute: 0 10*3/uL (ref 0.0–0.1)
Eosinophils Absolute: 0.1 10*3/uL (ref 0.0–0.7)
Lymphocytes Relative: 53 % — ABNORMAL HIGH (ref 12–46)
Lymphs Abs: 2.2 10*3/uL (ref 0.7–4.0)
MCH: 31.6 pg (ref 26.0–34.0)
Neutrophils Relative %: 33 % — ABNORMAL LOW (ref 43–77)
Platelets: 235 10*3/uL (ref 150–400)
RBC: 4.74 MIL/uL (ref 4.22–5.81)
RDW: 12 % (ref 11.5–15.5)
WBC: 4.2 10*3/uL (ref 4.0–10.5)

## 2012-12-26 LAB — BASIC METABOLIC PANEL
Calcium: 9.5 mg/dL (ref 8.4–10.5)
GFR calc Af Amer: >90 mL/min (ref >90)
GFR calc non Af Amer: >90 mL/min (ref >90)
Glucose, Bld: 276 mg/dL — ABNORMAL HIGH (ref 70–99)
Potassium: 3.7 mEq/L (ref 3.5–5.1)
Sodium: 137 mEq/L (ref 135–145)

## 2012-12-26 MED ORDER — METOCLOPRAMIDE HCL 5 MG/ML IJ SOLN
10.0000 mg | Freq: Once | INTRAMUSCULAR | Status: AC
  Administered 2012-12-26: 10 mg via INTRAMUSCULAR
  Filled 2012-12-26: qty 2

## 2012-12-26 MED ORDER — AMLODIPINE BESYLATE 10 MG PO TABS: 10.0000 mg | ORAL_TABLET | Freq: Every day | ORAL | Status: DC

## 2012-12-26 MED ORDER — PHENYTOIN SODIUM EXTENDED 100 MG PO CAPS: 100.0000 mg | ORAL_CAPSULE | Freq: Three times a day (TID) | ORAL | Status: DC

## 2012-12-26 MED ORDER — METFORMIN HCL 500 MG PO TABS: 500.0000 mg | ORAL_TABLET | Freq: Two times a day (BID) | ORAL | Status: DC

## 2012-12-26 MED ORDER — PHENYTOIN 50 MG PO CHEW
400.0000 mg | CHEWABLE_TABLET | Freq: Three times a day (TID) | ORAL | Status: DC
  Administered 2012-12-26: 400 mg via ORAL
  Filled 2012-12-26: qty 8

## 2012-12-26 MED ORDER — DIPHENHYDRAMINE HCL 50 MG/ML IJ SOLN
12.5000 mg | Freq: Once | INTRAMUSCULAR | Status: AC
  Administered 2012-12-26: 12.5 mg via INTRAMUSCULAR
  Filled 2012-12-26: qty 1

## 2012-12-26 MED ORDER — PHENYTOIN SODIUM EXTENDED 100 MG PO CAPS
ORAL_CAPSULE | ORAL | Status: AC
  Administered 2012-12-26: 400 mg
  Filled 2012-12-26: qty 4

## 2012-12-26 MED ORDER — AMLODIPINE BESYLATE 5 MG PO TABS
10.0000 mg | ORAL_TABLET | Freq: Once | ORAL | Status: AC
  Administered 2012-12-26: 10 mg via ORAL
  Filled 2012-12-26: qty 2

## 2012-12-26 NOTE — ED Notes (Signed)
Patient transported to CT 

## 2012-12-26 NOTE — ED Notes (Signed)
I took cbg and got 266 mg/dcltr.

## 2012-12-26 NOTE — ED Notes (Signed)
Patient reports that he awoke this am with generalized headache and feeling hot all over. Was a former [facility] patient and reports no meds since [facility] closed. On assessment alert and oriented, denies trauma

## 2012-12-26 NOTE — ED Notes (Signed)
Patient received total of 400mg  of dilantin

## 2012-12-26 NOTE — ED Provider Notes (Signed)
History     CSN: [id number]  Arrival date & time 12/26/12  5784   First MD Initiated Contact with Patient 12/26/12 [phone]      Chief Complaint  Patient presents with  . Headache    (Consider location/radiation/quality/duration/timing/severity/associated sxs/prior treatment) Patient is a 42 y.o. male presenting with headaches. The history is provided by the patient.  Headache Pain location:  Frontal Quality:  Dull Radiates to:  Does not radiate Severity currently:  6/10 Severity at highest:  6/10 Onset quality:  Gradual Duration:  5 hours Timing:  Constant Progression:  Worsening Chronicity:  Recurrent Context: not activity, not exposure to bright light, not caffeine, not coughing, not defecating, not eating, not stress, not exposure to cold air, not intercourse, not loud noise and not straining   Relieved by:  Nothing Worsened by:  Nothing tried Ineffective treatments:  Aspirin Associated symptoms: no abdominal pain, no cough, no diarrhea, no dizziness, no fever, no focal weakness, no loss of balance, no myalgias, no nausea, no neck pain, no neck stiffness, no numbness, no photophobia, no seizures, no syncope, no tingling, no URI, no visual change, no vomiting and no weakness   Risk factors: insomnia   Started at work and came home and laid down and it did not get better has been off his sz meds and HTN meds for more than 5 months.   Not sudden onset not the worst headache of life.  No neuro symptoms.  No f/c/r.  No rashes on the skin Past Medical History  Diagnosis Date  . Diabetes mellitus   . Seizures   . Hypertension     History reviewed. No pertinent past surgical history.  No family history on file.  History  Substance Use Topics  . Smoking status: Never Smoker   . Smokeless tobacco: Not on file  . Alcohol Use: Yes      Review of Systems  Constitutional: Negative for fever and chills.  HENT: Negative for neck pain and neck stiffness.   Eyes: Negative for  photophobia.  Respiratory: Negative for cough and shortness of breath.   Cardiovascular: Negative for chest pain and syncope.  Gastrointestinal: Negative for nausea, vomiting, abdominal pain and diarrhea.  Musculoskeletal: Negative for myalgias.  Neurological: Positive for headaches. Negative for dizziness, focal weakness, seizures, numbness and loss of balance.  All other systems reviewed and are negative.    Allergies  Review of patient's allergies indicates no known allergies.  Home Medications   Current Outpatient Rx  Name  Route  Sig  Dispense  Refill  . insulin aspart (NOVOLOG) 100 UNIT/ML injection   Subcutaneous   Inject into the skin 3 (three) times daily before meals.           BP 181/107  Pulse 85  Temp(Src) 97.4 F (36.3 C) (Oral)  SpO2 99%  Physical Exam  Constitutional: He is oriented to person, place, and time. He appears well-developed and well-nourished. No distress.  HENT:  Head: Normocephalic and atraumatic.  Mouth/Throat: Oropharynx is clear and moist.  No temporal pain  Eyes: Conjunctivae and EOM are normal. Pupils are equal, round, and reactive to light.  Neck: Normal range of motion. Neck supple. No JVD present. No tracheal deviation present.  No meningeal signs  Cardiovascular: Normal rate, regular rhythm and intact distal pulses.   Pulmonary/Chest: Effort normal and breath sounds normal. No stridor. He has no wheezes. He has no rales.  Abdominal: Soft. Bowel sounds are normal. There is no tenderness. There is  no rebound and no guarding.  Musculoskeletal: Normal range of motion. He exhibits no edema.  Lymphadenopathy:    He has no cervical adenopathy.  Neurological: He is alert and oriented to person, place, and time. He has normal reflexes. He displays normal reflexes. No cranial nerve deficit. Coordination normal.  Skin: Skin is warm and dry.  Psychiatric: He has a normal mood and affect.    ED Course  Procedures (including critical care  time)  Labs Reviewed  GLUCOSE, CAPILLARY - Abnormal; Notable for the following:    Glucose-Capillary 266 (*)    All other components within normal limits  CBC WITH DIFFERENTIAL - Abnormal; Notable for the following:    Neutrophils Relative 33 (*)    Neutro Abs 1.4 (*)    Lymphocytes Relative 53 (*)    All other components within normal limits  BASIC METABOLIC PANEL   No results found.   No diagnosis found.    MDM  Exam reassuring will PO load with dilatin and start on antihypertensive.  Did not check Dilatin level as patient has been out of medication since prior to [facility] closing and the level will be undetectable.   No indication for LP.  Anion gap is 10 and most recent hemoglobin A1c in paperwork patient has with him is 11.  P improved post medication and HA resolved.  Return for fevers, changes in vision, weakness numbness, intractable vomiting, changes in speech or any concern.  Will refill dilating and add norvasc as patient does not recall what anti hypertensive he was taking and Will add metformin to current regimen and [facility].          Jasmine Awe, MD 12/26/12 [phone]

## 2013-01-16 ENCOUNTER — Encounter (HOSPITAL_BASED_OUTPATIENT_CLINIC_OR_DEPARTMENT_OTHER): Payer: Self-pay

## 2013-01-16 ENCOUNTER — Emergency Department (HOSPITAL_BASED_OUTPATIENT_CLINIC_OR_DEPARTMENT_OTHER)
Admission: EM | Admit: 2013-01-16 | Discharge: 2013-01-16 | Disposition: A | Discharge status: Home / Self Care | Payer: Self-pay | Attending: Emergency Medicine | Admitting: Emergency Medicine

## 2013-01-16 DIAGNOSIS — G40909 Epilepsy, unspecified, not intractable, without status epilepticus: Secondary | ICD-10-CM | POA: Insufficient documentation

## 2013-01-16 DIAGNOSIS — Z794 Long term (current) use of insulin: Secondary | ICD-10-CM | POA: Insufficient documentation

## 2013-01-16 DIAGNOSIS — L03011 Cellulitis of right finger: Secondary | ICD-10-CM

## 2013-01-16 DIAGNOSIS — E119 Type 2 diabetes mellitus without complications: Secondary | ICD-10-CM | POA: Insufficient documentation

## 2013-01-16 DIAGNOSIS — Z79899 Other long term (current) drug therapy: Secondary | ICD-10-CM | POA: Insufficient documentation

## 2013-01-16 DIAGNOSIS — Z23 Encounter for immunization: Secondary | ICD-10-CM | POA: Insufficient documentation

## 2013-01-16 DIAGNOSIS — I10 Essential (primary) hypertension: Secondary | ICD-10-CM | POA: Insufficient documentation

## 2013-01-16 DIAGNOSIS — IMO0002 Reserved for concepts with insufficient information to code with codable children: Secondary | ICD-10-CM | POA: Insufficient documentation

## 2013-01-16 MED ORDER — LIDOCAINE HCL 2 % EX GEL
CUTANEOUS | Status: AC
  Administered 2013-01-16: 20
  Filled 2013-01-16: qty 20

## 2013-01-16 MED ORDER — TETANUS-DIPHTH-ACELL PERTUSSIS 5-2.5-18.5 LF-MCG/0.5 IM SUSP
0.5000 mL | Freq: Once | INTRAMUSCULAR | Status: AC
  Administered 2013-01-16: 0.5 mL via INTRAMUSCULAR
  Filled 2013-01-16: qty 0.5

## 2013-01-16 MED ORDER — CEPHALEXIN 500 MG PO CAPS: 500.0000 mg | ORAL_CAPSULE | Freq: Four times a day (QID) | ORAL | Status: DC

## 2013-01-16 NOTE — ED Provider Notes (Addendum)
History     CSN: [id number]  Arrival date & time 01/16/13  0241   First MD Initiated Contact with Patient 01/16/13 0320      Chief Complaint  Patient presents with  . finger swelling     (Consider location/radiation/quality/duration/timing/severity/associated sxs/prior treatment) Patient is a 42 y.o. male presenting with hand pain. The history is provided by the patient.  Hand Pain This is a new problem. The current episode started more than 2 days ago. The problem occurs constantly. The problem has been gradually worsening. Nothing aggravates the symptoms. Nothing relieves the symptoms. He has tried nothing for the symptoms. The treatment provided no relief.  Bites nails and developed swelling around cuticle of the right middle finger  Past Medical History  Diagnosis Date  . Diabetes mellitus   . Seizures   . Hypertension     History reviewed. No pertinent past surgical history.  No family history on file.  History  Substance Use Topics  . Smoking status: Never Smoker   . Smokeless tobacco: Not on file  . Alcohol Use: Yes      Review of Systems  Constitutional: Negative for fever.  Skin: Negative for wound.  All other systems reviewed and are negative.    Allergies  Review of patient's allergies indicates no known allergies.  Home Medications   Current Outpatient Rx  Name  Route  Sig  Dispense  Refill  . amLODipine (NORVASC) 10 MG tablet   Oral   Take 1 tablet (10 mg total) by mouth daily.   30 tablet   0   . insulin aspart (NOVOLOG) 100 UNIT/ML injection   Subcutaneous   Inject into the skin 3 (three) times daily before meals.         . metFORMIN (GLUCOPHAGE) 500 MG tablet   Oral   Take 1 tablet (500 mg total) by mouth 2 (two) times daily with a meal.   60 tablet   0   . phenytoin (DILANTIN) 100 MG ER capsule   Oral   Take 1 capsule (100 mg total) by mouth 3 (three) times daily.   90 capsule   0     BP 148/94  Pulse 100  Temp(Src) 98  F (36.7 C)  Resp 18  SpO2 97%  Physical Exam  Constitutional: He is oriented to person, place, and time. He appears well-developed and well-nourished. No distress.  HENT:  Head: Normocephalic and atraumatic.  Eyes: Conjunctivae and EOM are normal.  Neck: Normal range of motion. Neck supple.  Cardiovascular: Normal rate, regular rhythm and intact distal pulses.   Pulmonary/Chest: Effort normal and breath sounds normal.  Abdominal: Soft. Bowel sounds are normal. There is no tenderness.  Musculoskeletal: Normal range of motion.       Hands: Neurological: He is alert and oriented to person, place, and time.  Skin: Skin is warm and dry.  Psychiatric: He has a normal mood and affect.    ED Course  Procedures (including critical care time)  Labs Reviewed - No data to display No results found.   1. Paronychia, right       MDM  Return for fevers, return of swelling streaking up the finger or any concerns.  Follow up with hand surgery for ongoing care   INCISION AND DRAINAGE Performed by: Jasmine Awe Consent: Verbal consent obtained. Risks and benefits: risks, benefits and alternatives were discussed Type: abscess  Body area: right middle finger  Incision was made with a scalpel.  Complexity: complex  Blunt dissection to break up loculations  Drainage: purulent  Drainage amount: 2 cc  Patient tolerance: Patient tolerated the procedure well with no immediate complications.       Jasmine Awe, MD 01/16/13 0327  Karma Ansley K Aaryn Sermon-Rasch, MD 01/16/13 [phone]

## 2013-01-16 NOTE — ED Notes (Signed)
Patient here with right hand middle digit swelling x 2 days. Denies trauma but reports the swelling started after biting his nails, tenderness noted around nailbed

## 2013-03-14 ENCOUNTER — Ambulatory Visit: Payer: Self-pay

## 2013-03-18 ENCOUNTER — Ambulatory Visit: Payer: Self-pay

## 2013-04-01 ENCOUNTER — Ambulatory Visit: Payer: No Typology Code available for payment source | Attending: Family Medicine | Admitting: Family Medicine

## 2013-04-01 VITALS — BP 141/89 | HR 87 | Temp 98.2°F | Resp 17 | Wt 246.0 lb

## 2013-04-01 DIAGNOSIS — R7309 Other abnormal glucose: Secondary | ICD-10-CM

## 2013-04-01 DIAGNOSIS — G40909 Epilepsy, unspecified, not intractable, without status epilepticus: Secondary | ICD-10-CM | POA: Insufficient documentation

## 2013-04-01 DIAGNOSIS — IMO0001 Reserved for inherently not codable concepts without codable children: Secondary | ICD-10-CM | POA: Insufficient documentation

## 2013-04-01 DIAGNOSIS — R739 Hyperglycemia, unspecified: Secondary | ICD-10-CM

## 2013-04-01 DIAGNOSIS — R569 Unspecified convulsions: Secondary | ICD-10-CM

## 2013-04-01 DIAGNOSIS — I10 Essential (primary) hypertension: Secondary | ICD-10-CM | POA: Insufficient documentation

## 2013-04-01 DIAGNOSIS — Z9119 Patient's noncompliance with other medical treatment and regimen: Secondary | ICD-10-CM | POA: Insufficient documentation

## 2013-04-01 DIAGNOSIS — E119 Type 2 diabetes mellitus without complications: Secondary | ICD-10-CM

## 2013-04-01 LAB — COMPREHENSIVE METABOLIC PANEL
ALT: 39 U/L (ref 0–53)
AST: 27 U/L (ref 0–37)
Albumin: 4.4 g/dL (ref 3.5–5.2)
Alkaline Phosphatase: 72 U/L (ref 39–117)
Calcium: 9.7 mg/dL (ref 8.4–10.5)
Chloride: 98 mEq/L (ref 96–112)
Potassium: 4.1 mEq/L (ref 3.5–5.3)
Sodium: 136 mEq/L (ref 135–145)
Total Protein: 7.2 g/dL (ref 6.0–8.3)

## 2013-04-01 LAB — CBC
HCT: 44.4 % (ref 39.0–52.0)
MCHC: 35.1 g/dL (ref 30.0–36.0)
Platelets: 261 10*3/uL (ref 150–400)
RDW: 13 % (ref 11.5–15.5)
WBC: 3.3 10*3/uL — ABNORMAL LOW (ref 4.0–10.5)

## 2013-04-01 LAB — POCT GLYCOSYLATED HEMOGLOBIN (HGB A1C): Hemoglobin A1C: 14

## 2013-04-01 LAB — GLUCOSE, POCT (MANUAL RESULT ENTRY): POC Glucose: 542 mg/dl — AB (ref 70–99)

## 2013-04-01 MED ORDER — METFORMIN HCL ER 500 MG PO TB24: 1000.0000 mg | ORAL_TABLET | Freq: Two times a day (BID) | ORAL | Status: DC

## 2013-04-01 MED ORDER — INSULIN ASPART 100 UNIT/ML ~~LOC~~ SOLN
14.0000 [IU] | Freq: Once | SUBCUTANEOUS | Status: AC
  Administered 2013-04-01: 14 [IU] via SUBCUTANEOUS

## 2013-04-01 MED ORDER — PHENYTOIN SODIUM EXTENDED 100 MG PO CAPS: 100.0000 mg | ORAL_CAPSULE | Freq: Three times a day (TID) | ORAL | Status: DC

## 2013-04-01 MED ORDER — LISINOPRIL-HYDROCHLOROTHIAZIDE 20-12.5 MG PO TABS: 1.0000 | ORAL_TABLET | Freq: Every day | ORAL | Status: DC

## 2013-04-01 MED ORDER — ATORVASTATIN CALCIUM 40 MG PO TABS: 40.0000 mg | ORAL_TABLET | Freq: Every day | ORAL | Status: DC

## 2013-04-01 MED ORDER — INSULIN ASPART 100 UNIT/ML ~~LOC~~ SOLN: 14.0000 [IU] | Freq: Three times a day (TID) | SUBCUTANEOUS | Status: DC

## 2013-04-01 MED ORDER — AMLODIPINE BESYLATE 10 MG PO TABS: 10.0000 mg | ORAL_TABLET | Freq: Every day | ORAL | Status: DC

## 2013-04-01 MED ORDER — INSULIN GLARGINE 100 UNIT/ML ~~LOC~~ SOLN: 40.0000 [IU] | Freq: Every day | SUBCUTANEOUS | Status: DC

## 2013-04-01 NOTE — Progress Notes (Signed)
Patient ID: Albert Garcia, male   DOB: 05-Sep-1971, 42 y.o.   MRN: [id number]  CC: establish  HPI: Pt has not seen a provider in over a year.  He has not taken any of his insulin or any of his dilantin in over 6 months.  He has symptoms of erectile dysfunction, headaches and dry mouth and frequent urination.  He says that he is willing to take his medication if he got a prescription but he has no medical insurance.  He does have an [employer] discount card.   No Known Allergies Past Medical History  Diagnosis Date  . Diabetes mellitus   . Seizures   . Hypertension    No current outpatient prescriptions on file prior to visit.   No current facility-administered medications on file prior to visit.   History reviewed. No pertinent family history. History   Social History  . Marital Status: Single    Spouse Name: N/A    Number of Children: N/A  . Years of Education: N/A   Occupational History  . Not on file.   Social History Main Topics  . Smoking status: Never Smoker   . Smokeless tobacco: Not on file  . Alcohol Use: Yes  . Drug Use: No  . Sexually Active: No   Other Topics Concern  . Not on file   Social History Narrative  . No narrative on file    Review of Systems  Constitutional: Negative for fever, chills, diaphoresis, activity change, appetite change and fatigue.  HENT: Negative for ear pain, nosebleeds, congestion, facial swelling, rhinorrhea, neck pain, neck stiffness and ear discharge.   Eyes: Negative for pain, discharge, redness, itching and visual disturbance.  Respiratory: Negative for cough, choking, chest tightness, shortness of breath, wheezing and stridor.   Cardiovascular: Negative for chest pain, palpitations and leg swelling.  Gastrointestinal: Negative for abdominal distention.  Genitourinary: Negative for dysuria, urgency, frequency, hematuria, flank pain, decreased urine volume, difficulty urinating and dyspareunia.  Musculoskeletal: Negative for back  pain, joint swelling, arthralgias and gait problem.  Neurological: Negative for dizziness, tremors, seizures, syncope, facial asymmetry, speech difficulty, weakness, light-headedness, numbness and headaches.  Hematological: Negative for adenopathy. Does not bruise/bleed easily.  Psychiatric/Behavioral: Negative for hallucinations, behavioral problems, confusion, dysphoric mood, decreased concentration and agitation.    Objective:   Filed Vitals:   04/01/13 1401  BP: 141/89  Pulse: 87  Temp: 98.2 F (36.8 C)  Resp: 17    Physical Exam  Constitutional: Appears well-developed and well-nourished. No distress.  HENT: Normocephalic. External right and left ear normal. Oropharynx is clear and moist.  Eyes: Conjunctivae and EOM are normal. PERRLA, no scleral icterus.  Neck: Normal ROM. Neck supple. No JVD. No tracheal deviation. No thyromegaly.  CVS: RRR, S1/S2 +, no murmurs, no gallops, no carotid bruit.  Pulmonary: Effort and breath sounds normal, no stridor, rhonchi, wheezes, rales.  Abdominal: Soft. BS +,  no distension, tenderness, rebound or guarding.  Musculoskeletal: Normal range of motion. No edema and no tenderness.  Lymphadenopathy: No lymphadenopathy noted, cervical, inguinal. Neuro: Alert. Normal reflexes, muscle tone coordination. No cranial nerve deficit. Skin: Skin is warm and dry. No rash noted. Not diaphoretic. No erythema. No pallor.  Psychiatric: Normal mood and affect. Behavior, judgment, thought content normal.   Lab Results  Component Value Date   WBC 4.2 12/26/2012   HGB 15.0 12/26/2012   HCT 41.7 12/26/2012   MCV 88.0 12/26/2012   PLT 235 12/26/2012   Lab Results  Component Value  Date   CREATININE 0.60 12/26/2012   BUN 11 12/26/2012   NA 137 12/26/2012   K 3.7 12/26/2012   CL 100 12/26/2012   CO2 27 12/26/2012    Lab Results  Component Value Date   HGBA1C 14.0 % 04/01/2013   Lipid Panel     Component Value Date/Time   CHOL 130 03/22/2010 2009   TRIG 266* 03/22/2010  2009   HDL 33* 03/22/2010 2009   CHOLHDL 3.9 Ratio 03/22/2010 2009   VLDL 53* 03/22/2010 2009   LDLCALC 44 03/22/2010 2009       Assessment and plan:   Patient Active Problem List   Diagnosis Date Noted  . DIABETES MELLITUS, TYPE II 09/06/2007  . OBESITY 09/06/2007  . HYPERTENSION 09/06/2007  . SEIZURE DISORDER 09/06/2007   Uncontrolled type 2 diabetes mellitus Hyperglycemia Poor medical compliance Hypertension Seizure Disorder   14 units of novolog given in office  Rx lantus 40 units every evening Novolog 14 units TIDAC Glucophage XR 1000 mg po BID with meals Zestoretic 10/12.5 Lipitor 40  Mg Dilantin 100 mg TID Monitor potassium levels  Follow up in 5 days for recheck  The patient was given clear instructions to go to ER or return to medical center if symptoms don't improve, worsen or new problems develop.  The patient verbalized understanding.  The patient was told to call to get lab results if they haven't heard anything in the next week.    Rodney Langton, MD, CDE, FAAFP [facility] [place], [place]

## 2013-04-01 NOTE — Patient Instructions (Signed)
Hypoglycemia (Low Blood Sugar) Hypoglycemia is when the glucose (sugar) in your blood is too low. Hypoglycemia can happen for many reasons. It can happen to people with or without diabetes. Hypoglycemia can develop quickly and can be a medical emergency.  CAUSES  Having hypoglycemia does not mean that you will develop diabetes. Different causes include:  Missed or delayed meals or not enough carbohydrates eaten.  Medication overdose. This could be by accident or deliberate. If by accident, your medication may need to be adjusted or changed.  Exercise or increased activity without adjustments in carbohydrates or medications.  A nerve disorder that affects body functions like your heart rate, blood pressure and digestion (autonomic neuropathy).  A condition where the stomach muscles do not function properly (gastroparesis). Therefore, medications may not absorb properly.  The inability to recognize the signs of hypoglycemia (hypoglycemic unawareness).  Absorption of insulin  may be altered.  Alcohol consumption.  Pregnancy/menstrual cycles/postpartum. This may be due to hormones.  Certain kinds of tumors. This is very rare. SYMPTOMS   Sweating.  Hunger.  Dizziness.  Blurred vision.  Drowsiness.  Weakness.  Headache.  Rapid heart beat.  Shakiness.  Nervousness. DIAGNOSIS  Diagnosis is made by monitoring blood glucose in one or all of the following ways:  Fingerstick blood glucose monitoring.  Laboratory results. TREATMENT  If you think your blood glucose is low:  Check your blood glucose, if possible. If it is less than 70 mg/dl, take one of the following:  3-4 glucose tablets.   cup juice (prefer clear like apple).   cup "regular" soda pop.  1 cup milk.  -1 tube of glucose gel.  5-6 hard candies.  Do not over treat because your blood glucose (sugar) will only go too high.  Wait 15 minutes and recheck your blood glucose. If it is still less than  70 mg/dl (or below your target range), repeat treatment.  Eat a snack if it is more than one hour until your next meal. Sometimes, your blood glucose may go so low that you are unable to treat yourself. You may need someone to help you. You may even pass out or be unable to swallow. This may require you to get an injection of glucagon, which raises the blood glucose. HOME CARE INSTRUCTIONS  Check blood glucose as recommended by your caregiver.  Take medication as prescribed by your caregiver.  Follow your meal plan. Do not skip meals. Eat on time.  If you are going to drink alcohol, drink it only with meals.  Check your blood glucose before driving.  Check your blood glucose before and after exercise. If you exercise longer or different than usual, be sure to check blood glucose more frequently.  Always carry treatment with you. Glucose tablets are the easiest to carry.  Always wear medical alert jewelry or carry some form of identification that states that you have diabetes. This will alert people that you have diabetes. If you have hypoglycemia, they will have a better idea on what to do. SEEK MEDICAL CARE IF:   You are having problems keeping your blood sugar at target range.  You are having frequent episodes of hypoglycemia.  You feel you might be having side effects from your medicines.  You have symptoms of an illness that is not improving after 3-4 days.  You notice a change in vision or a new problem with your vision. SEEK IMMEDIATE MEDICAL CARE IF:   You are a family member or friend of a   person whose blood glucose goes below 70 mg/dl and is accompanied by:  Confusion.  A change in mental status.  The inability to swallow.  Passing out. Document Released: 10/13/2005 Document Revised: 01/05/2012 Document Reviewed: 02/09/2012 [facility] Patient Information 2014 [place], [place]. Blood Sugar Monitoring, Adult GLUCOSE METERS FOR SELF-MONITORING OF BLOOD GLUCOSE  It is  important to be able to correctly measure your blood sugar (glucose). You can use a blood glucose monitor (a small battery-operated device) to check your glucose level at any time. This allows you and your caregiver to monitor your diabetes and to determine how well your treatment plan is working. The process of monitoring your blood glucose with a glucose meter is called self-monitoring of blood glucose (SMBG). When people with diabetes control their blood sugar, they have better health. To test for glucose with a typical glucose meter, place the disposable strip in the meter. Then place a small sample of blood on the "test strip." The test strip is coated with chemicals that combine with glucose in blood. The meter measures how much glucose is present. The meter displays the glucose level as a number. Several new models can record and store a number of test results. Some models can connect to personal computers to store test results or print them out.  Newer meters are often easier to use than older models. Some meters allow you to get blood from places other than your fingertip. Some new models have automatic timing, error codes, signals, or barcode readers to help with proper adjustment (calibration). Some meters have a large display screen or spoken instructions for people with visual impairments.  INSTRUCTIONS FOR USING GLUCOSE METERS  Wash your hands with soap and warm water, or clean the area with alcohol. Dry your hands completely.  Prick the side of your fingertip with a lancet (a sharp-pointed tool used by hand).  Hold the hand down and gently milk the finger until a small drop of blood appears. Catch the blood with the test strip.  Follow the instructions for inserting the test strip and using the SMBG meter. Most meters require the meter to be turned on and the test strip to be inserted before applying the blood sample.  Record the test result.  Read the instructions carefully for both  the meter and the test strips that go with it. Meter instructions are found in the user manual. Keep this manual to help you solve any problems that may arise. Many meters use "error codes" when there is a problem with the meter, the test strip, or the blood sample on the strip. You will need the manual to understand these error codes and fix the problem.  New devices are available such as laser lancets and meters that can test blood taken from "alternative sites" of the body, other than fingertips. However, you should use standard fingertip testing if your glucose changes rapidly. Also, use standard testing if:  You have eaten, exercised, or taken insulin in the past 2 hours.  You think your glucose is low.  You tend to not feel symptoms of low blood glucose (hypoglycemia).  You are ill or under stress.  Clean the meter as directed by the manufacturer.  Test the meter for accuracy as directed by the manufacturer.  Take your meter with you to your caregiver's office. This way, you can test your glucose in front of your caregiver to make sure you are using the meter correctly. Your caregiver can also take a sample of blood  to test using a routine lab method. If values on the glucose meter are close to the lab results, you and your caregiver will see that your meter is working well and you are using good technique. Your caregiver will advise you about what to do if the results do not match. FREQUENCY OF TESTING  Your caregiver will tell you how often you should check your blood glucose. This will depend on your type of diabetes, your current level of diabetes control, and your types of medicines. The following are general guidelines, but your care plan may be different. Record all your readings and the time of day you took them for review with your caregiver.   Diabetes type 1.  When you are using insulin with good diabetic control (either multiple daily injections or via a pump), you should  check your glucose 4 times a day.  If your diabetes is not well controlled, you may need to monitor more frequently, including before meals and 2 hours after meals, at bedtime, and occasionally between 2 a.m. and 3 a.m.  You should always check your glucose before a dose of insulin or before changing the rate on your insulin pump.  Diabetes type 2.  Guidelines for SMBG in diabetes type 2 are not as well defined.  If you are on insulin, follow the guidelines above.  If you are on medicines, but not insulin, and your glucose is not well controlled, you should test at least twice daily.  If you are not on insulin, and your diabetes is controlled with medicines or diet alone, you should test at least once daily, usually before breakfast.  A weekly profile will help your caregiver advise you on your care plan. The week before your visit, check your glucose before a meal and 2 hours after a meal at least daily. You may want to test before and after a different meal each day so you and your caregiver can tell how well controlled your blood sugars are throughout the course of a 24 hour period.  Gestational diabetes (diabetes during pregnancy).  Frequent testing is often necessary. Accurate timing is important.  If you are not on insulin, check your glucose 4 times a day. Check it before breakfast and 1 hour after the start of each meal.  If you are on insulin, check your glucose 6 times a day. Check it before each meal and 1 hour after the first bite of each meal.  General guidelines.  More frequent testing is required at the start of insulin treatment. Your caregiver will instruct you.  Test your glucose any time you suspect you have low blood sugar (hypoglycemia).  You should test more often when you change medicines, when you have unusual stress or illness, or in other unusual circumstances. OTHER THINGS TO KNOW ABOUT GLUCOSE METERS  Measurement Range. Most glucose meters are able to  read glucose levels over a broad range of values from as low as 0 to as high as 600 mg/dL. If you get an extremely high or low reading from your meter, you should first confirm it with another reading. Report very high or very low readings to your caregiver.  Whole Blood Glucose versus Plasma Glucose. Some older home glucose meters measure glucose in your whole blood. In a lab or when using some newer home glucose meters, the glucose is measured in your plasma (one component of blood). The difference can be important. It is important for you and your caregiver to know whether  your meter gives its results as "whole blood equivalent" or "plasma equivalent."  Display of High and Low Glucose Values. Part of learning how to operate a meter is understanding what the meter results mean. Know how high and low glucose concentrations are displayed on your meter.  Factors that Affect Glucose Meter Performance. The accuracy of your test results depends on many factors and varies depending on the brand and type of meter. These factors include:  Low red blood cell count (anemia).  Substances in your blood (such as uric acid, vitamin C, and others).  Environmental factors (temperature, humidity, altitude).  Name-brand versus generic test strips.  Calibration. Make sure your meter is set up properly. It is a good idea to do a calibration test with a control solution recommended by the manufacturer of your meter whenever you begin using a fresh bottle of test strips. This will help verify the accuracy of your meter.  Improperly stored, expired, or defective test strips. Keep your strips in a dry place with the lid on.  Soiled meter.  Inadequate blood sample. NEW TECHNOLOGIES FOR GLUCOSE TESTING Alternative site testing Some glucose meters allow testing blood from alternative sites. These include the:  Upper arm.  Forearm.  Base of the thumb.  Thigh. Sampling blood from alternative sites may be  desirable. However, it may have some limitations. Blood in the fingertips show changes in glucose levels more quickly than blood in other parts of the body. This means that alternative site test results may be different from fingertip test results, not because of the meter's ability to test accurately, but because the actual glucose concentration can be different.  Continuous Glucose Monitoring Devices to measure your blood glucose continuously are available, and others are in development. These methods can be more expensive than self-monitoring with a glucose meter. However, it is uncertain how effective and reliable these devices are. Your caregiver will advise you if this approach makes sense for you. IF BLOOD SUGARS ARE CONTROLLED, PEOPLE WITH DIABETES REMAIN HEALTHIER.  SMBG is an important part of the treatment plan of patients with diabetes mellitus. Below are reasons for using SMBG:   It confirms that your glucose is at a specific, healthy level.  It detects hypoglycemia and severe hyperglycemia.  It allows you and your caregiver to make adjustments in response to changes in lifestyle for individuals requiring medicine.  It determines the need for starting insulin therapy in temporary diabetes that happens during pregnancy (gestational diabetes). Document Released: 10/16/2003 Document Revised: 01/05/2012 Document Reviewed: 02/06/2011 [facility] Patient Information 2014 [place], [place]. Epilepsy A seizure (convulsion) is a sudden change in brain function that causes a change in behavior, muscle activity, or ability to remain awake and alert. If a person has recurring seizures, this is called epilepsy. CAUSES  Epilepsy is a disorder with many possible causes. Anything that disturbs the normal pattern of brain cell activity can lead to seizures. Seizure can be caused from illness to brain damage to abnormal brain development. Epilepsy may develop because of:  An abnormality in brain  wiring.  An imbalance of nerve signaling chemicals (neurotransmitters).  Some combination of these factors. Scientists are learning an increasing amount about genetic causes of seizures. SYMPTOMS  The symptoms of a seizure can vary greatly from one person to another. These may include:  An aura, or warning that tells a person they are about to have a seizure.  Abnormal sensations, such as abnormal smell or seeing flashing lights.  Sudden, general body stiffness.  Rhythmic jerking of the face, arm, or leg  on one or both sides.  Sudden change in consciousness.  The person may appear to be awake but not responding.  They may appear to be asleep but cannot be awakened.  Grimacing, chewing, lip smacking, or drooling.  Often there is a period of sleepiness after a seizure. DIAGNOSIS  The description you give to your caregiver about what you experienced will help them understand your problems. Equally important is the description by any witnesses to your seizure. A physical exam, including a detailed neurological exam, is necessary. An EEG (electroencephalogram) is a painless test of your brain waves. In this test a diagram is created of your brain waves. These diagrams can be interpreted by a specialist. Pictures of your brain are usually taken with:  An MRI.  A CT scan. Lab tests may be done to look for:  Signs of infection.  Abnormal blood chemistry. PREVENTION  There is no way to prevent the development of epilepsy. If you have seizures that are typically triggered by an event (such as flashing lights), try to avoid the trigger. This can help you avoid a seizure.  PROGNOSIS  Most people with epilepsy lead outwardly normal lives. While epilepsy cannot currently be cured, for some people it does eventually go away. Most seizures do not cause brain damage. It is not uncommon for people with epilepsy, especially children, to develop behavioral and emotional problems. These problems  are sometimes the consequence of medicine for seizures or social stress. For some people with epilepsy, the risk of seizures restricts their independence and recreational activities. For example, some states refuse drivers licenses to people with epilepsy. Most women with epilepsy can become pregnant. They should discuss their epilepsy and the medicine they are taking with their caregivers. Women with epilepsy have a 90 percent or better chance of having a normal, healthy baby. RISKS AND COMPLICATIONS  People with epilepsy are at increased risk of falls, accidents, and injuries. People with epilepsy are at special risk for two life-threatening conditions. These are status epilepticus and sudden unexplained death (extremely rare). Status epilepticus is a long lasting, continuous seizure that is a medical emergency. TREATMENT  Once epilepsy is diagnosed, it is important to begin treatment as soon as possible. For about 80 percent of those diagnosed with epilepsy, seizures can be controlled with modern medicines and surgical techniques. Some antiepileptic drugs can interfere with the effectiveness of oral contraceptives. In 1997, the FDA approved a pacemaker for the brain the (vagus nerve stimulator). This stimulator can be used for people with seizures that are not well-controlled by medicine. Studies have shown that in some cases, children may experience fewer seizures if they maintain a strict diet. The strict diet is called the ketogenic diet. This diet is rich in fats and low in carbohydrates. HOME CARE INSTRUCTIONS   Your caregiver will make recommendations about driving and safety in normal activities. Follow these carefully.  Take any medicine prescribed exactly as directed.  Do any blood tests requested to monitor the levels of your medicine.  The people you live and work with should know that you are prone to seizures. They should receive instructions on how to help you. In general, a witness to  a seizure should:  Cushion your head and body.  Turn you on your side.  Avoid unnecessarily restraining you.  Not place anything inside your mouth.  Call for local emergency medical help if there is any question about what has occurred.  Keep a seizure diary. Record what you recall about any seizure, especially any possible trigger.  If your caregiver has given you a follow-up appointment, it is very important to keep that appointment. Not keeping the appointment could result in permanent injury and disability. If there is any problem keeping the appointment, you must call back to this facility for assistance. SEEK MEDICAL CARE IF:   You develop signs of infection or other illness. This might increase the risk of a seizure.  You seem to be having more frequent seizures.  Your seizure pattern is changing. SEEK IMMEDIATE MEDICAL CARE IF:   A seizure does not stop after a few moments.  A seizure causes any difficulty in breathing.  A seizure results in a very severe headache.  A seizure leaves you with the inability to speak or use a part of your body. MAKE SURE YOU:   Understand these instructions.  Will watch your condition.  Will get help right away if you are not doing well or get worse. Document Released: 10/13/2005 Document Revised: 01/05/2012 Document Reviewed: 05/19/2008 [facility] Patient Information 2014 [place], [place].

## 2013-04-01 NOTE — Progress Notes (Signed)
Patient here to establish care History of DM 

## 2013-04-04 NOTE — Progress Notes (Signed)
Quick Note:  Please inform patient that his labs came back OK except that his blood sugar was very high.   Rodney Langton, MD, CDE, FAAFP [facility] [place], [place]   ______

## 2013-04-06 ENCOUNTER — Ambulatory Visit: Payer: No Typology Code available for payment source | Attending: Family Medicine | Admitting: Internal Medicine

## 2013-04-06 VITALS — BP 111/76 | HR 97 | Temp 97.6°F | Resp 16

## 2013-04-06 DIAGNOSIS — R569 Unspecified convulsions: Secondary | ICD-10-CM

## 2013-04-06 DIAGNOSIS — Z9119 Patient's noncompliance with other medical treatment and regimen: Secondary | ICD-10-CM

## 2013-04-06 DIAGNOSIS — E785 Hyperlipidemia, unspecified: Secondary | ICD-10-CM | POA: Insufficient documentation

## 2013-04-06 DIAGNOSIS — G40909 Epilepsy, unspecified, not intractable, without status epilepticus: Secondary | ICD-10-CM | POA: Insufficient documentation

## 2013-04-06 DIAGNOSIS — I10 Essential (primary) hypertension: Secondary | ICD-10-CM

## 2013-04-06 DIAGNOSIS — Z91199 Patient's noncompliance with other medical treatment and regimen due to unspecified reason: Secondary | ICD-10-CM

## 2013-04-06 DIAGNOSIS — E119 Type 2 diabetes mellitus without complications: Secondary | ICD-10-CM

## 2013-04-06 DIAGNOSIS — IMO0001 Reserved for inherently not codable concepts without codable children: Secondary | ICD-10-CM

## 2013-04-06 MED ORDER — GLUCOSE BLOOD VI STRP: ORAL_STRIP | Status: DC

## 2013-04-06 NOTE — Progress Notes (Signed)
Patient ID: Albert Garcia, male   DOB: 08-Apr-1971, 42 y.o.   MRN: [id number] Patient Demographics  Albert Garcia, is a 42 y.o. male  [id number]  [id number]  DOB - 06-16-1971  Chief Complaint  Patient presents with  . Follow-up        Subjective:   Albert Garcia today is here for a follow up visit. He was seen last week in the clinic and told to followup today for [place] to review his Accu-Cheks at home. He was just started on 40 units of Lantus each bedtime, 20 units of NovoLog with meals, 1000 mg of metformin twice a day and 4 mg of Amaryl. He unfortunately did not check his sugars at home at all, his last sugar check is here in the clinic last week, he claims that he did not buy the insulin test strips. He was also restarted on Dilantin, however he just started taking them today.   Patient has No headache, No chest pain, No abdominal pain - No Nausea, No new weakness tingling or numbness, No Cough - SOB.  Objective:    Filed Vitals:   04/06/13 1725  BP: 111/76  Pulse: 97  Temp: 97.6 F (36.4 C)  Resp: 16  SpO2: 95%     ALLERGIES:  No Known Allergies  PAST MEDICAL HISTORY: Past Medical History  Diagnosis Date  . Diabetes mellitus   . Seizures   . Hypertension     MEDICATIONS AT HOME: Prior to Admission medications   Medication Sig Start Date End Date Taking? Authorizing Provider  amLODipine (NORVASC) 10 MG tablet Take 1 tablet (10 mg total) by mouth daily. 04/01/13   Clanford Cyndie Mull, MD  aspirin EC 325 MG tablet Take 325 mg by mouth daily.    Historical Provider, MD  atorvastatin (LIPITOR) 40 MG tablet Take 1 tablet (40 mg total) by mouth daily. 04/01/13   Clanford Cyndie Mull, MD  glimepiride (AMARYL) 4 MG tablet Take 4 mg by mouth daily before breakfast.    Historical Provider, MD  glucose blood (ACCU-CHEK ACTIVE STRIPS) test strip Use as instructed 04/06/13   Maretta Bees, MD  insulin aspart (NOVOLOG) 100 UNIT/ML injection Inject 14 Units into the skin 3  (three) times daily before meals. 04/01/13   Clanford Cyndie Mull, MD  insulin glargine (LANTUS) 100 UNIT/ML injection Inject 0.4 mLs (40 Units total) into the skin at bedtime. 04/01/13   Clanford Cyndie Mull, MD  lisinopril-hydrochlorothiazide (PRINZIDE,ZESTORETIC) 20-12.5 MG per tablet Take 1 tablet by mouth daily. 04/01/13   Clanford Cyndie Mull, MD  metFORMIN (GLUCOPHAGE XR) 500 MG 24 hr tablet Take 2 tablets (1,000 mg total) by mouth 2 (two) times daily with a meal. 04/01/13   Clanford Cyndie Mull, MD  phenytoin (DILANTIN) 100 MG ER capsule Take 1 capsule (100 mg total) by mouth 3 (three) times daily. 04/01/13   Clanford Cyndie Mull, MD  sildenafil (VIAGRA) 50 MG tablet Take 50 mg by mouth daily as needed for erectile dysfunction.    Historical Provider, MD     Exam  General appearance :Awake, alert, not in any distress. Speech Clear. Not toxic Looking HEENT: Atraumatic and Normocephalic, pupils equally reactive to light and accomodation Neck: supple, no JVD. No cervical lymphadenopathy.  Chest:Good air entry bilaterally, no added sounds  CVS: S1 S2 regular, no murmurs.  Abdomen: Bowel sounds present, Non tender and not distended with no gaurding, rigidity or rebound. Extremities: B/L Lower Ext shows no edema, both legs are warm to touch  Neurology: Awake alert, and oriented X 3, CN II-XII intact, Non focal Skin:No Rash Wounds:N/A    Data Review   CBC  Recent Labs Lab 04/01/13 1446  WBC 3.3*  HGB 15.6  HCT 44.4  PLT 261  MCV 90.4  MCH 31.8  MCHC 35.1  RDW 13.0    Chemistries    Recent Labs Lab 04/01/13 1446  NA 136  K 4.1  CL 98  CO2 25  GLUCOSE 489*  BUN 10  CREATININE 1.02  CALCIUM 9.7  AST 27  ALT 39  ALKPHOS 72  BILITOT 0.6   ------------------------------------------------------------------------------------------------------------------ No results found for this basename: HGBA1C,  in the last 72  hours ------------------------------------------------------------------------------------------------------------------ No results found for this basename: CHOL, HDL, LDLCALC, TRIG, CHOLHDL, LDLDIRECT,  in the last 72 hours ------------------------------------------------------------------------------------------------------------------ No results found for this basename: TSH, T4TOTAL, FREET3, T3FREE, THYROIDAB,  in the last 72 hours ------------------------------------------------------------------------------------------------------------------ No results found for this basename: VITAMINB12, FOLATE, FERRITIN, TIBC, IRON, RETICCTPCT,  in the last 72 hours  Coagulation profile  No results found for this basename: INR, PROTIME,  in the last 168 hours    Assessment & Plan   Uncontrolled diabetes - Check CBG - Have counseled patient extensively that we need to make sure that he starts taking his sugars so we can start adjusting his insulin regimen, I have explained to him the importance of monitoring her sugars to prevent life-threatening hypoglycemic episodes and to further optimize glycemic control so we can prevent him from having long-term complications of diabetes. - Will provide him with a prescription for accu strips - Will bring him back in one week for another repeat visit to see if we can start adjusting his insulin regimen. - I have asked him to keep a record of his CBG readings so that he can bring it with him for his next visit - In the meantime, continue with current insulin regimen along with metformin and Amaryl  Hypertension - Controlled to continue with current regimen  Dyslipidemia - Continue with statin  Seizure disorder - Continue with the Dilantin - He just started taking it today (6/11) - Will need to check a Dilantin level at some point.  Followup in one week- please check his CBG readings and adjust insulin dose accordingly.

## 2013-04-06 NOTE — Progress Notes (Signed)
Patient here for follow up DM 

## 2013-06-21 ENCOUNTER — Other Ambulatory Visit: Payer: Self-pay | Admitting: Internal Medicine

## 2013-06-21 MED ORDER — QUINAPRIL-HYDROCHLOROTHIAZIDE 20-12.5 MG PO TABS: 1.0000 | ORAL_TABLET | Freq: Every day | ORAL | Status: DC

## 2013-06-21 MED ORDER — ROSUVASTATIN CALCIUM 20 MG PO TABS: 20.0000 mg | ORAL_TABLET | Freq: Every day | ORAL | Status: DC

## 2013-07-07 ENCOUNTER — Ambulatory Visit: Payer: Self-pay

## 2013-08-30 ENCOUNTER — Ambulatory Visit: Payer: No Typology Code available for payment source | Attending: Internal Medicine | Admitting: Internal Medicine

## 2013-08-30 VITALS — BP 136/91 | HR 104 | Temp 98.6°F | Resp 16 | Ht 69.0 in | Wt 246.6 lb

## 2013-08-30 DIAGNOSIS — R739 Hyperglycemia, unspecified: Secondary | ICD-10-CM

## 2013-08-30 DIAGNOSIS — I10 Essential (primary) hypertension: Secondary | ICD-10-CM | POA: Insufficient documentation

## 2013-08-30 DIAGNOSIS — E785 Hyperlipidemia, unspecified: Secondary | ICD-10-CM | POA: Insufficient documentation

## 2013-08-30 DIAGNOSIS — IMO0001 Reserved for inherently not codable concepts without codable children: Secondary | ICD-10-CM | POA: Insufficient documentation

## 2013-08-30 DIAGNOSIS — R7309 Other abnormal glucose: Secondary | ICD-10-CM

## 2013-08-30 DIAGNOSIS — E1059 Type 1 diabetes mellitus with other circulatory complications: Secondary | ICD-10-CM

## 2013-08-30 DIAGNOSIS — Z9119 Patient's noncompliance with other medical treatment and regimen: Secondary | ICD-10-CM

## 2013-08-30 MED ORDER — QUINAPRIL-HYDROCHLOROTHIAZIDE 20-12.5 MG PO TABS: 1.0000 | ORAL_TABLET | Freq: Every day | ORAL | Status: DC

## 2013-08-30 MED ORDER — INSULIN GLARGINE 100 UNIT/ML SOLOSTAR PEN: 30.0000 [IU] | PEN_INJECTOR | Freq: Every day | SUBCUTANEOUS | Status: DC

## 2013-08-30 MED ORDER — INSULIN ASPART 100 UNIT/ML ~~LOC~~ SOLN: 15.0000 [IU] | Freq: Once | SUBCUTANEOUS | Status: DC

## 2013-08-30 MED ORDER — INSULIN ASPART 100 UNIT/ML ~~LOC~~ SOLN
15.0000 [IU] | Freq: Once | SUBCUTANEOUS | Status: AC
  Administered 2013-08-30: 15 [IU] via SUBCUTANEOUS

## 2013-08-30 MED ORDER — PHENYTOIN SODIUM EXTENDED 100 MG PO CAPS: 100.0000 mg | ORAL_CAPSULE | Freq: Three times a day (TID) | ORAL | Status: DC

## 2013-08-30 MED ORDER — GLIMEPIRIDE 4 MG PO TABS: 4.0000 mg | ORAL_TABLET | Freq: Every day | ORAL | Status: DC

## 2013-08-30 MED ORDER — GLUCOSE BLOOD VI STRP: ORAL_STRIP | Status: DC

## 2013-08-30 MED ORDER — METFORMIN HCL ER 500 MG PO TB24: 1000.0000 mg | ORAL_TABLET | Freq: Two times a day (BID) | ORAL | Status: DC

## 2013-08-30 MED ORDER — ASPIRIN EC 325 MG PO TBEC: 325.0000 mg | DELAYED_RELEASE_TABLET | Freq: Every day | ORAL | Status: DC

## 2013-08-30 MED ORDER — FREESTYLE SYSTEM KIT: 1.0000 | PACK | Status: DC | PRN

## 2013-08-30 MED ORDER — INSULIN ASPART 100 UNIT/ML ~~LOC~~ SOLN: 14.0000 [IU] | Freq: Three times a day (TID) | SUBCUTANEOUS | Status: DC

## 2013-08-30 MED ORDER — ROSUVASTATIN CALCIUM 20 MG PO TABS: 20.0000 mg | ORAL_TABLET | Freq: Every day | ORAL | Status: DC
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
## 2013-08-30 NOTE — Progress Notes (Signed)
Pt has a cc of a full physical, BP check and glucose reading; glucose reading of 529. Pt is Type II Diabetic and has hypertension. Has not been taking current medication prescribed for 3-4 weeks. Pt experiences high stress and requests diabetes education. Pt has no known allergies.

## 2013-08-30 NOTE — Patient Instructions (Signed)
Please check her sugars 3-4 times a day, make a record of it and bring it to your next followup appointment.

## 2013-08-30 NOTE — Addendum Note (Signed)
Addended by: Maretta Bees on: 08/30/2013 02:14 PM   Modules accepted: Orders

## 2013-09-26 ENCOUNTER — Ambulatory Visit: Payer: Self-pay | Admitting: *Deleted

## 2013-11-18 ENCOUNTER — Other Ambulatory Visit: Payer: Self-pay | Admitting: Emergency Medicine

## 2013-11-18 MED ORDER — INSULIN ASPART 100 UNIT/ML ~~LOC~~ SOLN: 14.0000 [IU] | Freq: Three times a day (TID) | SUBCUTANEOUS | Status: DC

## 2013-11-29 ENCOUNTER — Emergency Department (HOSPITAL_BASED_OUTPATIENT_CLINIC_OR_DEPARTMENT_OTHER)
Admission: EM | Admit: 2013-11-29 | Discharge: 2013-11-29 | Disposition: A | Discharge status: Home / Self Care | Payer: Self-pay | Attending: Emergency Medicine | Admitting: Emergency Medicine

## 2013-11-29 ENCOUNTER — Other Ambulatory Visit: Payer: Self-pay | Admitting: Internal Medicine

## 2013-11-29 ENCOUNTER — Encounter (HOSPITAL_BASED_OUTPATIENT_CLINIC_OR_DEPARTMENT_OTHER): Payer: Self-pay | Admitting: Emergency Medicine

## 2013-11-29 DIAGNOSIS — G5690 Unspecified mononeuropathy of unspecified upper limb: Secondary | ICD-10-CM | POA: Insufficient documentation

## 2013-11-29 DIAGNOSIS — Z7982 Long term (current) use of aspirin: Secondary | ICD-10-CM | POA: Insufficient documentation

## 2013-11-29 DIAGNOSIS — I10 Essential (primary) hypertension: Secondary | ICD-10-CM | POA: Insufficient documentation

## 2013-11-29 DIAGNOSIS — L03114 Cellulitis of left upper limb: Secondary | ICD-10-CM

## 2013-11-29 DIAGNOSIS — E119 Type 2 diabetes mellitus without complications: Secondary | ICD-10-CM | POA: Insufficient documentation

## 2013-11-29 DIAGNOSIS — IMO0002 Reserved for concepts with insufficient information to code with codable children: Secondary | ICD-10-CM | POA: Insufficient documentation

## 2013-11-29 DIAGNOSIS — G40909 Epilepsy, unspecified, not intractable, without status epilepticus: Secondary | ICD-10-CM | POA: Insufficient documentation

## 2013-11-29 DIAGNOSIS — Z794 Long term (current) use of insulin: Secondary | ICD-10-CM | POA: Insufficient documentation

## 2013-11-29 LAB — CBC WITH DIFFERENTIAL/PLATELET
Basophils Absolute: 0 10*3/uL (ref 0.0–0.1)
Basophils Relative: 0 % (ref 0–1)
Eosinophils Absolute: 0 10*3/uL (ref 0.0–0.7)
Eosinophils Relative: 1 % (ref 0–5)
HCT: 37.6 % — ABNORMAL LOW (ref 39.0–52.0)
Hemoglobin: 13.4 g/dL (ref 13.0–17.0)
Lymphocytes Relative: 34 % (ref 12–46)
Lymphs Abs: 2.1 10*3/uL (ref 0.7–4.0)
MCH: 32.8 pg (ref 26.0–34.0)
MCHC: 35.6 g/dL (ref 30.0–36.0)
MCV: 91.9 fL (ref 78.0–100.0)
Monocytes Absolute: 0.5 10*3/uL (ref 0.1–1.0)
Monocytes Relative: 8 % (ref 3–12)
Neutro Abs: 3.5 10*3/uL (ref 1.7–7.7)
Neutrophils Relative %: 57 % (ref 43–77)
Platelets: 238 10*3/uL (ref 150–400)
RBC: 4.09 MIL/uL — ABNORMAL LOW (ref 4.22–5.81)
RDW: 12.1 % (ref 11.5–15.5)
WBC: 6.1 10*3/uL (ref 4.0–10.5)

## 2013-11-29 LAB — COMPREHENSIVE METABOLIC PANEL WITH GFR
ALT: 38 U/L (ref 0–53)
AST: 27 U/L (ref 0–37)
Albumin: 3.9 g/dL (ref 3.5–5.2)
Alkaline Phosphatase: 61 U/L (ref 39–117)
BUN: 12 mg/dL (ref 6–23)
CO2: 27 meq/L (ref 19–32)
Calcium: 9.5 mg/dL (ref 8.4–10.5)
Chloride: 102 meq/L (ref 96–112)
Creatinine, Ser: 0.7 mg/dL (ref 0.50–1.35)
GFR calc Af Amer: >90 mL/min
GFR calc non Af Amer: >90 mL/min
Glucose, Bld: 256 mg/dL — ABNORMAL HIGH (ref 70–99)
Potassium: 4 meq/L (ref 3.7–5.3)
Sodium: 142 meq/L (ref 137–147)
Total Bilirubin: <0.2 mg/dL — ABNORMAL LOW (ref 0.3–1.2)
Total Protein: 7.5 g/dL (ref 6.0–8.3)

## 2013-11-29 MED ORDER — CEPHALEXIN 500 MG PO CAPS: 500.0000 mg | ORAL_CAPSULE | Freq: Four times a day (QID) | ORAL | Status: DC

## 2013-11-29 MED ORDER — CEFTRIAXONE SODIUM 1 G IJ SOLR
INTRAMUSCULAR | Status: AC
  Filled 2013-11-29: qty 10

## 2013-11-29 MED ORDER — HYDROCODONE-ACETAMINOPHEN 5-325 MG PO TABS: 2.0000 | ORAL_TABLET | ORAL | Status: DC | PRN

## 2013-11-29 MED ORDER — SULFAMETHOXAZOLE-TRIMETHOPRIM 800-160 MG PO TABS: 1.0000 | ORAL_TABLET | Freq: Two times a day (BID) | ORAL | Status: DC

## 2013-11-29 MED ORDER — DEXTROSE 5 % IV SOLN
1.0000 g | INTRAVENOUS | Status: DC
  Administered 2013-11-29: 1 g via INTRAVENOUS

## 2013-11-29 MED ORDER — SULFAMETHOXAZOLE-TMP DS 800-160 MG PO TABS
1.0000 | ORAL_TABLET | Freq: Once | ORAL | Status: AC
  Administered 2013-11-29: 1 via ORAL
  Filled 2013-11-29: qty 1

## 2013-11-29 NOTE — ED Provider Notes (Signed)
Medical screening examination/treatment/procedure(s) were performed by non-physician practitioner and as supervising physician I was immediately available for consultation/collaboration.  EKG Interpretation   None         William Tyreik Delahoussaye, MD 11/29/13 2316 

## 2013-11-29 NOTE — ED Notes (Signed)
PA at bedside.

## 2013-11-29 NOTE — ED Notes (Signed)
Pt in with c/o left lower arm swelling and "neuropathy" like tingling in hands and feet

## 2013-11-29 NOTE — Discharge Instructions (Signed)
Cellulitis Cellulitis is an infection of the skin and the tissue beneath it. The infected area is usually red and tender. Cellulitis occurs most often in the arms and lower legs.  CAUSES  Cellulitis is caused by bacteria that enter the skin through cracks or cuts in the skin. The most common types of bacteria that cause cellulitis are Staphylococcus and Streptococcus. SYMPTOMS   Redness and warmth.  Swelling.  Tenderness or pain.  Fever. DIAGNOSIS  Your caregiver can usually determine what is wrong based on a physical exam. Blood tests may also be done. TREATMENT  Treatment usually involves taking an antibiotic medicine. HOME CARE INSTRUCTIONS   Take your antibiotics as directed. Finish them even if you start to feel better.  Keep the infected arm or leg elevated to reduce swelling.  Apply a warm cloth to the affected area up to 4 times per day to relieve pain.  Only take over-the-counter or prescription medicines for pain, discomfort, or fever as directed by your caregiver.  Keep all follow-up appointments as directed by your caregiver. SEEK MEDICAL CARE IF:   You notice red streaks coming from the infected area.  Your red area gets larger or turns dark in color.  Your bone or joint underneath the infected area becomes painful after the skin has healed.  Your infection returns in the same area or another area.  You notice a swollen bump in the infected area.  You develop new symptoms. SEEK IMMEDIATE MEDICAL CARE IF:   You have a fever.  You feel very sleepy.  You develop vomiting or diarrhea.  You have a general ill feeling (malaise) with muscle aches and pains. MAKE SURE YOU:   Understand these instructions.  Will watch your condition.  Will get help right away if you are not doing well or get worse. Document Released: 07/23/2005 Document Revised: 04/13/2012 Document Reviewed: 12/29/2011 [employer] Patient Information 2014 [employer].  

## 2013-11-29 NOTE — ED Provider Notes (Signed)
CSN: [id number]     Arrival date & time 11/29/13  2005 History   First MD Initiated Contact with Patient 11/29/13 2104     Chief Complaint  Patient presents with  . Arm Pain    left arm  . Peripheral Neuropathy    hands and feet   (Consider location/radiation/quality/duration/timing/severity/associated sxs/prior Treatment) Patient is a 43 y.o. male presenting with arm pain. The history is provided by the patient. No language interpreter was used.  Arm Pain This is a new problem. The current episode started today. The problem occurs constantly. The problem has been gradually worsening. Associated symptoms include myalgias. Nothing aggravates the symptoms. He has tried nothing for the symptoms. The treatment provided moderate relief.    Past Medical History  Diagnosis Date  . Diabetes mellitus   . Seizures   . Hypertension    History reviewed. No pertinent past surgical history. No family history on file. History  Substance Use Topics  . Smoking status: Never Smoker   . Smokeless tobacco: Not on file  . Alcohol Use: Yes    Review of Systems  Musculoskeletal: Positive for myalgias.  All other systems reviewed and are negative.    Allergies  Review of patient's allergies indicates no known allergies.  Home Medications   Current Outpatient Rx  Name  Route  Sig  Dispense  Refill  . aspirin EC 325 MG tablet   Oral   Take 1 tablet (325 mg total) by mouth daily.   30 tablet   3   . glimepiride (AMARYL) 4 MG tablet   Oral   Take 1 tablet (4 mg total) by mouth daily before breakfast.   30 tablet   3   . glucose blood (ACCU-CHEK ACTIVE STRIPS) test strip      Use as instructed   100 each   12   . glucose monitoring kit (FREESTYLE) monitoring kit   Does not apply   1 each by Does not apply route as needed for other.   1 each   0   . insulin aspart (NOVOLOG) 100 UNIT/ML injection   Subcutaneous   Inject 14 Units into the skin 3 (three) times daily before  meals.   1 vial   3   . Insulin Glargine (LANTUS SOLOSTAR) 100 UNIT/ML SOPN   Subcutaneous   Inject 30 Units into the skin at bedtime.   15 mL   3   . metFORMIN (GLUCOPHAGE XR) 500 MG 24 hr tablet   Oral   Take 2 tablets (1,000 mg total) by mouth 2 (two) times daily with a meal.   120 tablet   3   . phenytoin (DILANTIN) 100 MG ER capsule   Oral   Take 1 capsule (100 mg total) by mouth 3 (three) times daily.   90 capsule   3   . quinapril-hydrochlorothiazide (ACCURETIC) 20-12.5 MG per tablet   Oral   Take 1 tablet by mouth daily.   90 tablet   3    BP 143/100  Pulse 95  Temp(Src) 98.7 F (37.1 C) (Oral)  Resp 20  Wt 255 lb (115.667 kg)  SpO2 100% Physical Exam  Nursing note and vitals reviewed. Constitutional: He is oriented to person, place, and time. He appears well-developed and well-nourished.  HENT:  Head: Normocephalic.  Eyes: Pupils are equal, round, and reactive to light.  Neck: Normal range of motion.  Cardiovascular: Normal rate.   Pulmonary/Chest: Effort normal.  Musculoskeletal: He exhibits tenderness.  Left  arm erythema left elbow,  Tender to palpation.  nv and ns intact  Neurological: He is alert and oriented to person, place, and time. He has normal reflexes.  Skin: Skin is warm.  Psychiatric: He has a normal mood and affect.    ED Course  Procedures (including critical care time) Labs Review Labs Reviewed  CULTURE, BLOOD (ROUTINE X 2)  CULTURE, BLOOD (ROUTINE X 2)  CBC WITH DIFFERENTIAL  COMPREHENSIVE METABOLIC PANEL   Imaging Review No results found.  EKG Interpretation   None       MDM   1. Cellulitis of left arm    Pt given IV rocephin and bactrim here.   Pt advised to return here tomorrow at 12 noon for recheck.    Pt given rx for bactrim and hydrocodone.    Fransico Meadow, PA-C 11/29/13 2155  [place], PA-C 11/29/13 2156

## 2013-11-30 ENCOUNTER — Emergency Department (HOSPITAL_BASED_OUTPATIENT_CLINIC_OR_DEPARTMENT_OTHER)
Admission: EM | Admit: 2013-11-30 | Discharge: 2013-11-30 | Disposition: A | Discharge status: Home / Self Care | Payer: Self-pay | Attending: Emergency Medicine | Admitting: Emergency Medicine

## 2013-11-30 ENCOUNTER — Encounter (HOSPITAL_BASED_OUTPATIENT_CLINIC_OR_DEPARTMENT_OTHER): Payer: Self-pay | Admitting: Emergency Medicine

## 2013-11-30 DIAGNOSIS — Z79899 Other long term (current) drug therapy: Secondary | ICD-10-CM | POA: Insufficient documentation

## 2013-11-30 DIAGNOSIS — Z794 Long term (current) use of insulin: Secondary | ICD-10-CM | POA: Insufficient documentation

## 2013-11-30 DIAGNOSIS — Z7982 Long term (current) use of aspirin: Secondary | ICD-10-CM | POA: Insufficient documentation

## 2013-11-30 DIAGNOSIS — E119 Type 2 diabetes mellitus without complications: Secondary | ICD-10-CM | POA: Insufficient documentation

## 2013-11-30 DIAGNOSIS — IMO0002 Reserved for concepts with insufficient information to code with codable children: Secondary | ICD-10-CM | POA: Insufficient documentation

## 2013-11-30 DIAGNOSIS — G40909 Epilepsy, unspecified, not intractable, without status epilepticus: Secondary | ICD-10-CM | POA: Insufficient documentation

## 2013-11-30 DIAGNOSIS — I10 Essential (primary) hypertension: Secondary | ICD-10-CM | POA: Insufficient documentation

## 2013-11-30 DIAGNOSIS — L0390 Cellulitis, unspecified: Secondary | ICD-10-CM

## 2013-11-30 MED ORDER — CEPHALEXIN 500 MG PO CAPS: 500.0000 mg | ORAL_CAPSULE | Freq: Four times a day (QID) | ORAL | Status: DC

## 2013-11-30 MED ORDER — CEFTRIAXONE SODIUM 1 G IJ SOLR
1.0000 g | Freq: Once | INTRAMUSCULAR | Status: AC
  Administered 2013-11-30: 1 g via INTRAMUSCULAR
  Filled 2013-11-30: qty 10

## 2013-11-30 NOTE — ED Provider Notes (Signed)
CSN: [id number]     Arrival date & time 11/30/13  1030 History   First MD Initiated Contact with Patient 11/30/13 1047     Chief Complaint  Patient presents with  . Wound Check    HPI  Patient seen and evaluated yesterday and given IV Rocephin for left elbow/forearm cellulitis. He states it is slightly better today. No spread. Decreasing pain. No fever. He did not fill his prescription because he states he could not afford it.  Past Medical History  Diagnosis Date  . Diabetes mellitus   . Seizures   . Hypertension    History reviewed. No pertinent past surgical history. No family history on file. History  Substance Use Topics  . Smoking status: Never Smoker   . Smokeless tobacco: Not on file  . Alcohol Use: Yes    Review of Systems  Constitutional: Negative for fever and fatigue.  Skin: Positive for color change and rash.       Left forearm cellulitis    Allergies  Review of patient's allergies indicates no known allergies.  Home Medications   Current Outpatient Rx  Name  Route  Sig  Dispense  Refill  . aspirin EC 325 MG tablet   Oral   Take 1 tablet (325 mg total) by mouth daily.   30 tablet   3   . cephALEXin (KEFLEX) 500 MG capsule   Oral   Take 1 capsule (500 mg total) by mouth 4 (four) times daily.   40 capsule   0   . cephALEXin (KEFLEX) 500 MG capsule   Oral   Take 1 capsule (500 mg total) by mouth 4 (four) times daily.   40 capsule   0   . glimepiride (AMARYL) 4 MG tablet   Oral   Take 1 tablet (4 mg total) by mouth daily before breakfast.   30 tablet   3   . glucose blood (ACCU-CHEK ACTIVE STRIPS) test strip      Use as instructed   100 each   12   . glucose monitoring kit (FREESTYLE) monitoring kit   Does not apply   1 each by Does not apply route as needed for other.   1 each   0   . HYDROcodone-acetaminophen (NORCO/VICODIN) 5-325 MG per tablet   Oral   Take 2 tablets by mouth every 4 (four) hours as needed.   20 tablet   0     . insulin aspart (NOVOLOG) 100 UNIT/ML injection   Subcutaneous   Inject 14 Units into the skin 3 (three) times daily before meals.   1 vial   3   . Insulin Glargine (LANTUS SOLOSTAR) 100 UNIT/ML SOPN   Subcutaneous   Inject 30 Units into the skin at bedtime.   15 mL   3   . metFORMIN (GLUCOPHAGE XR) 500 MG 24 hr tablet   Oral   Take 2 tablets (1,000 mg total) by mouth 2 (two) times daily with a meal.   120 tablet   3   . phenytoin (DILANTIN) 100 MG ER capsule   Oral   Take 1 capsule (100 mg total) by mouth 3 (three) times daily.   90 capsule   3   . quinapril-hydrochlorothiazide (ACCURETIC) 20-12.5 MG per tablet   Oral   Take 1 tablet by mouth daily.   90 tablet   3   . sulfamethoxazole-trimethoprim (SEPTRA DS) 800-160 MG per tablet   Oral   Take 1 tablet by mouth every  12 (twelve) hours.   20 tablet   0    BP 145/84  Pulse 94  Temp(Src) 97.8 F (36.6 C) (Oral)  Resp 18  SpO2 100% Physical Exam  Constitutional: He is oriented to person, place, and time. He appears well-developed and well-nourished. No distress.  HENT:  Head: Normocephalic.  Eyes: Conjunctivae are normal. Pupils are equal, round, and reactive to light. No scleral icterus.  Neck: Normal range of motion. Neck supple. No thyromegaly present.  Cardiovascular: Normal rate and regular rhythm.  Exam reveals no gallop and no friction rub.   No murmur heard. Pulmonary/Chest: Effort normal and breath sounds normal. No respiratory distress. He has no wheezes. He has no rales.  Abdominal: Soft. Bowel sounds are normal. He exhibits no distension. There is no tenderness. There is no rebound.  Musculoskeletal: Normal range of motion.       Arms: Neurological: He is alert and oriented to person, place, and time.  Skin: Skin is warm and dry. No rash noted.  Psychiatric: He has a normal mood and affect. His behavior is normal.    ED Course  Procedures (including critical care time) Labs Review Labs  Reviewed - No data to display Imaging Review No results found.  EKG Interpretation   None       MDM   1. Cellulitis    No proximal spread. Think is appropriate for continued outpatient treatment. Given IM Rocephin here. Given a prescription for Keflex. I discussed with him that this is available for $4 for the entire 10 day course at [facility] and other pharmacies.   Tanna Furry, MD 11/30/13 1101

## 2013-11-30 NOTE — Discharge Instructions (Signed)
[facility]   Cellulitis Cellulitis is an infection of the skin and the tissue under the skin. The infected area is usually red and tender. This happens most often in the arms and lower legs. HOME CARE   Take your antibiotic medicine as told. Finish the medicine even if you start to feel better.  Keep the infected arm or leg raised (elevated).  Put a warm cloth on the area up to 4 times per day.  Only take medicines as told by your doctor.  Keep all doctor visits as told. GET HELP RIGHT AWAY IF:   You have a fever.  You feel very sleepy.  You throw up (vomit) or have watery poop (diarrhea).  You feel sick and have muscle aches and pains.  You see red streaks on the skin coming from the infected area.  Your red area gets bigger or turns a dark color.  Your bone or joint under the infected area is painful after the skin heals.  Your infection comes back in the same area or different area.  You have a puffy (swollen) bump in the infected area.  You have new symptoms. MAKE SURE YOU:   Understand these instructions.  Will watch your condition.  Will get help right away if you are not doing well or get worse. Document Released: 03/31/2008 Document Revised: 04/13/2012 Document Reviewed: 12/29/2011 [facility] Patient Information 2014 [place].

## 2013-11-30 NOTE — ED Notes (Signed)
Pt here for f/u for cellulitis to left forearm. Pt sts area is not worse. Pain is 4/10. Pt sts he is having his prescribed meds filled at present and will pick them up following this visit.

## 2013-12-05 LAB — CULTURE, BLOOD (ROUTINE X 2)
CULTURE: NO GROWTH
CULTURE: NO GROWTH
SPECIAL REQUESTS: NORMAL
SPECIAL REQUESTS: NORMAL

## 2014-01-04 ENCOUNTER — Other Ambulatory Visit: Payer: Self-pay | Admitting: Internal Medicine

## 2014-02-01 ENCOUNTER — Other Ambulatory Visit: Payer: Self-pay | Admitting: Internal Medicine

## 2014-04-12 ENCOUNTER — Encounter: Payer: Self-pay | Admitting: Internal Medicine

## 2014-04-12 ENCOUNTER — Ambulatory Visit: Payer: Self-pay | Attending: Internal Medicine | Admitting: Internal Medicine

## 2014-04-12 VITALS — BP 123/88 | HR 94 | Temp 98.1°F | Resp 16 | Ht 68.0 in | Wt 245.0 lb

## 2014-04-12 DIAGNOSIS — Z9119 Patient's noncompliance with other medical treatment and regimen: Secondary | ICD-10-CM | POA: Insufficient documentation

## 2014-04-12 DIAGNOSIS — Z91199 Patient's noncompliance with other medical treatment and regimen due to unspecified reason: Secondary | ICD-10-CM | POA: Insufficient documentation

## 2014-04-12 DIAGNOSIS — Z7982 Long term (current) use of aspirin: Secondary | ICD-10-CM | POA: Insufficient documentation

## 2014-04-12 DIAGNOSIS — I10 Essential (primary) hypertension: Secondary | ICD-10-CM | POA: Insufficient documentation

## 2014-04-12 DIAGNOSIS — IMO0001 Reserved for inherently not codable concepts without codable children: Secondary | ICD-10-CM | POA: Insufficient documentation

## 2014-04-12 DIAGNOSIS — E669 Obesity, unspecified: Secondary | ICD-10-CM | POA: Insufficient documentation

## 2014-04-12 DIAGNOSIS — E1165 Type 2 diabetes mellitus with hyperglycemia: Principal | ICD-10-CM

## 2014-04-12 DIAGNOSIS — E785 Hyperlipidemia, unspecified: Secondary | ICD-10-CM | POA: Insufficient documentation

## 2014-04-12 DIAGNOSIS — O24919 Unspecified diabetes mellitus in pregnancy, unspecified trimester: Secondary | ICD-10-CM

## 2014-04-12 DIAGNOSIS — Z794 Long term (current) use of insulin: Secondary | ICD-10-CM | POA: Insufficient documentation

## 2014-04-12 DIAGNOSIS — Z79899 Other long term (current) drug therapy: Secondary | ICD-10-CM | POA: Insufficient documentation

## 2014-04-12 LAB — LIPID PANEL
Cholesterol: 105 mg/dL (ref 0–200)
HDL: 29 mg/dL — AB (ref >39)
LDL CALC: 43 mg/dL (ref 0–99)
Total CHOL/HDL Ratio: 3.6 Ratio
Triglycerides: 167 mg/dL — ABNORMAL HIGH (ref <150)
VLDL: 33 mg/dL (ref 0–40)

## 2014-04-12 LAB — CBC WITH DIFFERENTIAL/PLATELET
Basophils Absolute: 0.1 10*3/uL (ref 0.0–0.1)
Basophils Relative: 1 % (ref 0–1)
Eosinophils Absolute: 0.1 10*3/uL (ref 0.0–0.7)
Eosinophils Relative: 1 % (ref 0–5)
HCT: 43.5 % (ref 39.0–52.0)
Hemoglobin: 15.8 g/dL (ref 13.0–17.0)
Lymphocytes Relative: 48 % — ABNORMAL HIGH (ref 12–46)
Lymphs Abs: 2.4 10*3/uL (ref 0.7–4.0)
MCH: 32.4 pg (ref 26.0–34.0)
MCHC: 36.3 g/dL — ABNORMAL HIGH (ref 30.0–36.0)
MCV: 89.1 fL (ref 78.0–100.0)
Monocytes Absolute: 0.5 10*3/uL (ref 0.1–1.0)
Monocytes Relative: 9 % (ref 3–12)
Neutro Abs: 2.1 10*3/uL (ref 1.7–7.7)
Neutrophils Relative %: 41 % — ABNORMAL LOW (ref 43–77)
Platelets: 253 10*3/uL (ref 150–400)
RBC: 4.88 MIL/uL (ref 4.22–5.81)
RDW: 12.8 % (ref 11.5–15.5)
WBC: 5 10*3/uL (ref 4.0–10.5)

## 2014-04-12 LAB — COMPREHENSIVE METABOLIC PANEL
ALT: 34 U/L (ref 0–53)
AST: 26 U/L (ref 0–37)
Albumin: 4.7 g/dL (ref 3.5–5.2)
Alkaline Phosphatase: 56 U/L (ref 39–117)
BUN: 13 mg/dL (ref 6–23)
CO2: 30 mEq/L (ref 19–32)
Calcium: 9.9 mg/dL (ref 8.4–10.5)
Chloride: 90 mEq/L — ABNORMAL LOW (ref 96–112)
Creat: 0.9 mg/dL (ref 0.50–1.35)
Glucose, Bld: 374 mg/dL — ABNORMAL HIGH (ref 70–99)
Potassium: 4.3 mEq/L (ref 3.5–5.3)
Sodium: 129 mEq/L — ABNORMAL LOW (ref 135–145)
Total Bilirubin: 1.3 mg/dL — ABNORMAL HIGH (ref 0.2–1.2)
Total Protein: 8 g/dL (ref 6.0–8.3)

## 2014-04-12 LAB — HEMOGLOBIN A1C
Hgb A1c MFr Bld: 11.8 % — ABNORMAL HIGH (ref <5.7)
Mean Plasma Glucose: 292 mg/dL — ABNORMAL HIGH (ref <117)

## 2014-04-12 LAB — POCT GLYCOSYLATED HEMOGLOBIN (HGB A1C): Hemoglobin A1C: 11.4

## 2014-04-12 LAB — GLUCOSE, POCT (MANUAL RESULT ENTRY): POC Glucose: 367 mg/dl — AB (ref 70–99)

## 2014-04-12 MED ORDER — QUINAPRIL-HYDROCHLOROTHIAZIDE 20-12.5 MG PO TABS: 1.0000 | ORAL_TABLET | Freq: Every day | ORAL | Status: DC

## 2014-04-12 MED ORDER — PHENYTOIN SODIUM EXTENDED 100 MG PO CAPS: 100.0000 mg | ORAL_CAPSULE | Freq: Three times a day (TID) | ORAL | Status: DC

## 2014-04-12 MED ORDER — INSULIN ASPART 100 UNIT/ML ~~LOC~~ SOLN
20.0000 [IU] | Freq: Once | SUBCUTANEOUS | Status: AC
  Administered 2014-04-12: 20 [IU] via SUBCUTANEOUS

## 2014-04-12 MED ORDER — GLIMEPIRIDE 4 MG PO TABS: 4.0000 mg | ORAL_TABLET | Freq: Every day | ORAL | Status: DC

## 2014-04-12 MED ORDER — METFORMIN HCL ER 500 MG PO TB24: ORAL_TABLET | ORAL | Status: DC

## 2014-04-12 MED ORDER — INSULIN GLARGINE 100 UNIT/ML SOLOSTAR PEN: 30.0000 [IU] | PEN_INJECTOR | Freq: Every day | SUBCUTANEOUS | Status: DC

## 2014-04-12 MED ORDER — INSULIN ASPART 100 UNIT/ML ~~LOC~~ SOLN: 14.0000 [IU] | Freq: Three times a day (TID) | SUBCUTANEOUS | Status: DC

## 2014-04-12 NOTE — Progress Notes (Signed)
Pt is here following up on his HTN and diabetes. Pt needs his medication refilled.

## 2014-04-12 NOTE — Progress Notes (Signed)
Patient ID: Albert Garcia, male   DOB: 02-Mar-1971, 43 y.o.   MRN: [id number]   CC: Followup  HPI: Albert Garcia today is here for a follow up visit. Patient is a 43 year old African American male with a long-standing history of diabetes, hypertension and dyslipidemia-who presents today for routine follow up visit. Patient claims that for the past 3-4 weeks he has been noncompliant with medications and has ran out of the some medications. He denies chest pain or shortness of breath, he endorses chronic polydipsia and polyuria. He would like to have physical exam done today and really wants to try to work on his compliance so that he can feel better.  No Known Allergies Past Medical History  Diagnosis Date  . Diabetes mellitus   . Seizures   . Hypertension    Current Outpatient Prescriptions on File Prior to Visit  Medication Sig Dispense Refill  . aspirin EC 325 MG tablet Take 1 tablet (325 mg total) by mouth daily.  30 tablet  3  . glimepiride (AMARYL) 4 MG tablet Take 1 tablet (4 mg total) by mouth daily before breakfast.  30 tablet  3  . glucose blood (ACCU-CHEK ACTIVE STRIPS) test strip Use as instructed  100 each  12  . glucose monitoring kit (FREESTYLE) monitoring kit 1 each by Does not apply route as needed for other.  1 each  0  . insulin aspart (NOVOLOG) 100 UNIT/ML injection Inject 14 Units into the skin 3 (three) times daily before meals.  1 vial  3  . Insulin Glargine (LANTUS SOLOSTAR) 100 UNIT/ML SOPN Inject 30 Units into the skin at bedtime.  15 mL  3  . metFORMIN (GLUCOPHAGE-XR) 500 MG 24 hr tablet TAKE 2 TABLETS BY MOUTH TWICE DAILY WITH A MEAL  60 tablet  2  . phenytoin (DILANTIN) 100 MG ER capsule Take 1 capsule (100 mg total) by mouth 3 (three) times daily.  90 capsule  3  . quinapril-hydrochlorothiazide (ACCURETIC) 20-12.5 MG per tablet Take 1 tablet by mouth daily.  90 tablet  3  . sulfamethoxazole-trimethoprim (SEPTRA DS) 800-160 MG per tablet Take 1 tablet by mouth every  12 (twelve) hours.  20 tablet  0  . cephALEXin (KEFLEX) 500 MG capsule Take 1 capsule (500 mg total) by mouth 4 (four) times daily.  40 capsule  0  . cephALEXin (KEFLEX) 500 MG capsule Take 1 capsule (500 mg total) by mouth 4 (four) times daily.  40 capsule  0  . HYDROcodone-acetaminophen (NORCO/VICODIN) 5-325 MG per tablet Take 2 tablets by mouth every 4 (four) hours as needed.  20 tablet  0   No current facility-administered medications on file prior to visit.   No family history on file. History   Social History  . Marital Status: Single    Spouse Name: N/A    Number of Children: N/A  . Years of Education: N/A   Occupational History  . Not on file.   Social History Main Topics  . Smoking status: Never Smoker   . Smokeless tobacco: Not on file  . Alcohol Use: Yes  . Drug Use: No  . Sexual Activity: No   Other Topics Concern  . Not on file   Social History Narrative  . No narrative on file    Review of Systems  Constitutional: Negative for fever, chills, diaphoresis, activity change, appetite change and fatigue.  HENT: Negative for ear pain, nosebleeds, congestion, facial swelling, rhinorrhea, neck pain, neck stiffness and ear discharge.  Eyes: Negative for pain, discharge, redness, itching and visual disturbance.  Respiratory: Negative for cough, choking, chest tightness, shortness of breath, wheezing and stridor.   Cardiovascular: Negative for chest pain, palpitations and leg swelling.  Gastrointestinal: Negative for abdominal distention.  Genitourinary: Negative for dysuria, urgency, frequency Musculoskeletal: Negative for back pain, joint swelling, arthralgias and gait problem.  Neurological: Negative for dizziness, tremors, seizures, syncope, facial asymmetry, speech difficulty, weakness, light-headedness, numbness and headaches.  Hematological: Negative for adenopathy. Does not bruise/bleed easily.  Psychiatric/Behavioral: Negative for hallucinations, behavioral  problems, confusion, dysphoric mood, decreased concentration and agitation.    Objective:   Filed Vitals:   04/12/14 1126  BP: 123/88  Pulse: 94  Temp: 98.1 F (36.7 C)  Resp: 16    Physical Exam  Constitutional: Appears well-developed and well-nourished. No distress.  CVS: RRR, S1/S2 +, no murmurs, no gallops, no carotid bruit.  Pulmonary: Effort and breath sounds normal, no stridor, rhonchi, wheezes, rales.  Abdominal: Soft. BS +,  no distension, tenderness, rebound or guarding.  Musculoskeletal: Normal range of motion. +1 bilateral lower extremity edema    Lab Results  Component Value Date   WBC 6.1 11/29/2013   HGB 13.4 11/29/2013   HCT 37.6* 11/29/2013   MCV 91.9 11/29/2013   PLT 238 11/29/2013   Lab Results  Component Value Date   CREATININE 0.70 11/29/2013   BUN 12 11/29/2013   NA 142 11/29/2013   K 4.0 11/29/2013   CL 102 11/29/2013   CO2 27 11/29/2013    Lab Results  Component Value Date   HGBA1C 14.0 % 04/01/2013   Lipid Panel     Component Value Date/Time   CHOL 130 03/22/2010 2009   TRIG 266* 03/22/2010 2009   HDL 33* 03/22/2010 2009   CHOLHDL 3.9 Ratio 03/22/2010 2009   VLDL 53* 03/22/2010 2009   LDLCALC 44 03/22/2010 2009      Assessment and plan:   Patient Active Problem List   Diagnosis Date Noted  . Type II or unspecified type diabetes mellitus without mention of complication, uncontrolled - Last A1c 14  - I discussed with patient meaning of A1c and target range  - We have also spent significant amount of time discussing compliance and he verbalizes understanding  - I have provided samples from clinic and also advised patient to let us know if he is having difficulty in the future with affording medications  - I have advised meeting with our social worker to see if he is eligible for assistance  - We have also discussed importance of daily feet examination, yearly ophthalmology appointments  - Patient verbalizes understanding, referral to ophthalmology placed  09/06/2007  . OBESITY - Dietary recommendations provided, patient verbalizes understanding  09/06/2007  . HYPERTENSION - Patient advised to check blood pressure regularly and to call us back if the numbers are persistently higher than 140/90 so that we can readjust the medical regimen  09/06/2007

## 2014-05-10 ENCOUNTER — Ambulatory Visit: Payer: Self-pay

## 2014-05-16 ENCOUNTER — Ambulatory Visit: Payer: Self-pay | Admitting: Internal Medicine

## 2014-07-30 ENCOUNTER — Other Ambulatory Visit: Payer: Self-pay | Admitting: Internal Medicine

## 2014-08-04 ENCOUNTER — Other Ambulatory Visit: Payer: Self-pay | Admitting: Internal Medicine

## 2014-08-11 ENCOUNTER — Other Ambulatory Visit: Payer: Self-pay | Admitting: Emergency Medicine

## 2014-08-11 MED ORDER — METFORMIN HCL ER 500 MG PO TB24: ORAL_TABLET | ORAL | Status: DC

## 2015-01-08 ENCOUNTER — Other Ambulatory Visit: Payer: Self-pay

## 2015-01-08 MED ORDER — INSULIN ASPART 100 UNIT/ML ~~LOC~~ SOLN: 14.0000 [IU] | Freq: Three times a day (TID) | SUBCUTANEOUS | Status: DC

## 2015-01-25 ENCOUNTER — Emergency Department (HOSPITAL_COMMUNITY)
Admission: EM | Admit: 2015-01-25 | Discharge: 2015-01-25 | Disposition: A | Discharge status: Home / Self Care | Payer: Worker's Compensation | Attending: Emergency Medicine | Admitting: Emergency Medicine

## 2015-01-25 ENCOUNTER — Encounter (HOSPITAL_COMMUNITY): Payer: Self-pay | Admitting: Emergency Medicine

## 2015-01-25 DIAGNOSIS — Y9389 Activity, other specified: Secondary | ICD-10-CM | POA: Insufficient documentation

## 2015-01-25 DIAGNOSIS — Y9289 Other specified places as the place of occurrence of the external cause: Secondary | ICD-10-CM | POA: Diagnosis not present

## 2015-01-25 DIAGNOSIS — Z7982 Long term (current) use of aspirin: Secondary | ICD-10-CM | POA: Insufficient documentation

## 2015-01-25 DIAGNOSIS — I10 Essential (primary) hypertension: Secondary | ICD-10-CM | POA: Insufficient documentation

## 2015-01-25 DIAGNOSIS — R739 Hyperglycemia, unspecified: Secondary | ICD-10-CM

## 2015-01-25 DIAGNOSIS — E1165 Type 2 diabetes mellitus with hyperglycemia: Secondary | ICD-10-CM | POA: Insufficient documentation

## 2015-01-25 DIAGNOSIS — Z792 Long term (current) use of antibiotics: Secondary | ICD-10-CM | POA: Insufficient documentation

## 2015-01-25 DIAGNOSIS — Y998 Other external cause status: Secondary | ICD-10-CM | POA: Diagnosis not present

## 2015-01-25 DIAGNOSIS — M79602 Pain in left arm: Secondary | ICD-10-CM

## 2015-01-25 DIAGNOSIS — Z794 Long term (current) use of insulin: Secondary | ICD-10-CM | POA: Diagnosis not present

## 2015-01-25 DIAGNOSIS — T754XXA Electrocution, initial encounter: Secondary | ICD-10-CM | POA: Insufficient documentation

## 2015-01-25 DIAGNOSIS — G40909 Epilepsy, unspecified, not intractable, without status epilepticus: Secondary | ICD-10-CM | POA: Diagnosis not present

## 2015-01-25 DIAGNOSIS — W868XXA Exposure to other electric current, initial encounter: Secondary | ICD-10-CM | POA: Insufficient documentation

## 2015-01-25 LAB — I-STAT CHEM 8, ED
BUN: 21 mg/dL (ref 6–23)
Calcium, Ion: 1.2 mmol/L (ref 1.12–1.23)
Chloride: 98 mmol/L (ref 96–112)
Creatinine, Ser: 0.8 mg/dL (ref 0.50–1.35)
Glucose, Bld: 317 mg/dL — ABNORMAL HIGH (ref 70–99)
HCT: 45 % (ref 39.0–52.0)
Hemoglobin: 15.3 g/dL (ref 13.0–17.0)
POTASSIUM: 3.7 mmol/L (ref 3.5–5.1)
SODIUM: 137 mmol/L (ref 135–145)
TCO2: 24 mmol/L (ref 0–100)

## 2015-01-25 LAB — CK: Total CK: 375 U/L — ABNORMAL HIGH (ref 7–232)

## 2015-01-25 MED ORDER — OXYCODONE-ACETAMINOPHEN 5-325 MG PO TABS
1.0000 | ORAL_TABLET | Freq: Once | ORAL | Status: AC
  Administered 2015-01-25: 1 via ORAL
  Filled 2015-01-25: qty 1

## 2015-01-25 MED ORDER — HYDROCODONE-ACETAMINOPHEN 5-325 MG PO TABS: ORAL_TABLET | ORAL | Status: DC

## 2015-01-25 MED ORDER — ONDANSETRON 4 MG PO TBDP
4.0000 mg | ORAL_TABLET | Freq: Once | ORAL | Status: AC
  Administered 2015-01-25: 4 mg via ORAL
  Filled 2015-01-25: qty 1

## 2015-01-25 NOTE — ED Notes (Signed)
Pt states he was working on a piece of equipment and got shocked  Pt is c/o pain left arm  No marks noted to hand
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
## 2015-02-10 ENCOUNTER — Emergency Department (HOSPITAL_COMMUNITY)
Admission: EM | Admit: 2015-02-10 | Discharge: 2015-02-10 | Disposition: A | Discharge status: Home / Self Care | Payer: Worker's Compensation | Attending: Emergency Medicine | Admitting: Emergency Medicine

## 2015-02-10 ENCOUNTER — Emergency Department (HOSPITAL_COMMUNITY): Payer: Worker's Compensation

## 2015-02-10 ENCOUNTER — Encounter (HOSPITAL_COMMUNITY): Payer: Self-pay | Admitting: Emergency Medicine

## 2015-02-10 DIAGNOSIS — I10 Essential (primary) hypertension: Secondary | ICD-10-CM | POA: Diagnosis not present

## 2015-02-10 DIAGNOSIS — M791 Myalgia: Secondary | ICD-10-CM | POA: Insufficient documentation

## 2015-02-10 DIAGNOSIS — M79602 Pain in left arm: Secondary | ICD-10-CM | POA: Insufficient documentation

## 2015-02-10 DIAGNOSIS — Z79899 Other long term (current) drug therapy: Secondary | ICD-10-CM | POA: Insufficient documentation

## 2015-02-10 DIAGNOSIS — G40909 Epilepsy, unspecified, not intractable, without status epilepticus: Secondary | ICD-10-CM | POA: Diagnosis not present

## 2015-02-10 DIAGNOSIS — Z794 Long term (current) use of insulin: Secondary | ICD-10-CM | POA: Insufficient documentation

## 2015-02-10 DIAGNOSIS — R739 Hyperglycemia, unspecified: Secondary | ICD-10-CM

## 2015-02-10 DIAGNOSIS — M79603 Pain in arm, unspecified: Secondary | ICD-10-CM

## 2015-02-10 DIAGNOSIS — Z7982 Long term (current) use of aspirin: Secondary | ICD-10-CM | POA: Diagnosis not present

## 2015-02-10 DIAGNOSIS — E1165 Type 2 diabetes mellitus with hyperglycemia: Secondary | ICD-10-CM | POA: Diagnosis not present

## 2015-02-10 LAB — CBC WITH DIFFERENTIAL/PLATELET
BASOS ABS: 0 10*3/uL (ref 0.0–0.1)
BASOS PCT: 0 % (ref 0–1)
Eosinophils Absolute: 0.1 10*3/uL (ref 0.0–0.7)
Eosinophils Relative: 2 % (ref 0–5)
HCT: 40.3 % (ref 39.0–52.0)
Hemoglobin: 14.4 g/dL (ref 13.0–17.0)
Lymphocytes Relative: 60 % — ABNORMAL HIGH (ref 12–46)
Lymphs Abs: 2.6 10*3/uL (ref 0.7–4.0)
MCH: 32.5 pg (ref 26.0–34.0)
MCHC: 35.7 g/dL (ref 30.0–36.0)
MCV: 91 fL (ref 78.0–100.0)
MONOS PCT: 9 % (ref 3–12)
Monocytes Absolute: 0.4 10*3/uL (ref 0.1–1.0)
NEUTROS ABS: 1.2 10*3/uL — AB (ref 1.7–7.7)
Neutrophils Relative %: 29 % — ABNORMAL LOW (ref 43–77)
Platelets: 231 10*3/uL (ref 150–400)
RBC: 4.43 MIL/uL (ref 4.22–5.81)
RDW: 12 % (ref 11.5–15.5)
WBC: 4.3 10*3/uL (ref 4.0–10.5)

## 2015-02-10 LAB — BASIC METABOLIC PANEL
Anion gap: 8 (ref 5–15)
BUN: 17 mg/dL (ref 6–23)
CO2: 27 mmol/L (ref 19–32)
Calcium: 9.1 mg/dL (ref 8.4–10.5)
Chloride: 95 mmol/L — ABNORMAL LOW (ref 96–112)
Creatinine, Ser: 0.74 mg/dL (ref 0.50–1.35)
GFR calc Af Amer: >90 mL/min (ref >90)
GFR calc non Af Amer: >90 mL/min (ref >90)
Glucose, Bld: 280 mg/dL — ABNORMAL HIGH (ref 70–99)
Potassium: 3.7 mmol/L (ref 3.5–5.1)
Sodium: 130 mmol/L — ABNORMAL LOW (ref 135–145)

## 2015-02-10 LAB — TROPONIN I: Troponin I: <0.03 ng/mL (ref <0.031)

## 2015-02-10 LAB — CK: Total CK: 281 U/L — ABNORMAL HIGH (ref 7–232)

## 2015-02-10 MED ORDER — HYDROCODONE-ACETAMINOPHEN 5-325 MG PO TABS
2.0000 | ORAL_TABLET | Freq: Once | ORAL | Status: AC
  Administered 2015-02-10: 2 via ORAL
  Filled 2015-02-10: qty 2

## 2015-02-10 MED ORDER — NAPROXEN 500 MG PO TABS: 500.0000 mg | ORAL_TABLET | Freq: Two times a day (BID) | ORAL | Status: DC

## 2015-02-10 NOTE — Discharge Instructions (Signed)
Muscle Pain Keep yourself hydrated and monitor your blood sugars. Take the antiinflammatories as prescribed. Follow up with your doctor. Return tot the ED if you develop new or worsening symptoms. Muscle pain (myalgia) may be caused by many things, including:  Overuse or muscle strain, especially if you are not in shape. This is the most common cause of muscle pain.  Injury.  Bruises.  Viruses, such as the flu.  Infectious diseases.  Fibromyalgia, which is a chronic condition that causes muscle tenderness, fatigue, and headache.  Autoimmune diseases, including lupus.  Certain drugs, including ACE inhibitors and statins. Muscle pain may be mild or severe. In most cases, the pain lasts only a short time and goes away without treatment. To diagnose the cause of your muscle pain, your health care provider will take your medical history. This means he or she will ask you when your muscle pain began and what has been happening. If you have not had muscle pain for very long, your health care provider may want to wait before doing much testing. If your muscle pain has lasted a long time, your health care provider may want to run tests right away. If your health care provider thinks your muscle pain may be caused by illness, you may need to have additional tests to rule out certain conditions.  Treatment for muscle pain depends on the cause. Home care is often enough to relieve muscle pain. Your health care provider may also prescribe anti-inflammatory medicine. HOME CARE INSTRUCTIONS Watch your condition for any changes. The following actions may help to lessen any discomfort you are feeling:  Only take over-the-counter or prescription medicines as directed by your health care provider.  Apply ice to the sore muscle:  Put ice in a plastic bag.  Place a towel between your skin and the bag.  Leave the ice on for 15-20 minutes, 3-4 times a day.  You may alternate applying hot and cold packs to  the muscle as directed by your health care provider.  If overuse is causing your muscle pain, slow down your activities until the pain goes away.  Remember that it is normal to feel some muscle pain after starting a workout program. Muscles that have not been used often will be sore at first.  Do regular, gentle exercises if you are not usually active.  Warm up before exercising to lower your risk of muscle pain.  Do not continue working out if the pain is very bad. Bad pain could mean you have injured a muscle. SEEK MEDICAL CARE IF:  Your muscle pain gets worse, and medicines do not help.  You have muscle pain that lasts longer than 3 days.  You have a rash or fever along with muscle pain.  You have muscle pain after a tick bite.  You have muscle pain while working out, even though you are in good physical condition.  You have redness, soreness, or swelling along with muscle pain.  You have muscle pain after starting a new medicine or changing the dose of a medicine. SEEK IMMEDIATE MEDICAL CARE IF:  You have trouble breathing.  You have trouble swallowing.  You have muscle pain along with a stiff neck, fever, and vomiting.  You have severe muscle weakness or cannot move part of your body. MAKE SURE YOU:   Understand these instructions.  Will watch your condition.  Will get help right away if you are not doing well or get worse. Document Released: 09/04/2006 Document Revised: 10/18/2013 Document  Reviewed: 08/09/2013 Exit[employer] Patient Information 2015 [place], [place]. This information is not intended to replace advice given to you by your health care provider. Make sure you discuss any questions you have with your health care provider.

## 2015-02-10 NOTE — ED Notes (Signed)
Pt from home c/o left arm pain since 3/31 in which he was shocked while working with a piece of brass. He was seen here on 3/31 for same. He reports that this pain comes and goes and radiates to the shoulder.

## 2015-02-10 NOTE — ED Notes (Signed)
Patient transported to X-ray 

## 2015-02-10 NOTE — ED Provider Notes (Signed)
CSN: [id number]     Arrival date & time 02/10/15  0757 History   First MD Initiated Contact with Patient 02/10/15 [phone]     Chief Complaint  Patient presents with  . Arm Pain     (Consider location/radiation/quality/duration/timing/severity/associated sxs/prior Treatment) HPI Comments: Patient presents with intermittent left arm pain since March 31 when he was shocked by an Dealer source. The pain comes and goes lasting several hours at a time and his forearm and left shoulder. It is worse with lying on that side. It improves with taking pain medication. He sometimes has weakness in his hand from pain in his arm. Denies any history of problems with this arm prior to the shock. Denies any neck pain or chest pain. Denies any difficulty breathing. Denies any numbness or tingling. No chest pain or shortness of breath. He states he came here today because the pain was worse after he was done working.  The history is provided by the patient.    Past Medical History  Diagnosis Date  . Diabetes mellitus   . Seizures   . Hypertension    History reviewed. No pertinent past surgical history. Family History  Problem Relation Age of Onset  . Hypertension Mother   . Diabetes Mother   . Lupus Mother   . Kidney failure Mother   . Diabetes Father   . Hypertension Father   . Kidney failure Father    History  Substance Use Topics  . Smoking status: Never Smoker   . Smokeless tobacco: Not on file  . Alcohol Use: Yes    Review of Systems  Constitutional: Negative for fever, activity change, appetite change and fatigue.  HENT: Negative for congestion and rhinorrhea.   Respiratory: Negative for cough, chest tightness and shortness of breath.   Cardiovascular: Negative for chest pain.  Gastrointestinal: Negative for nausea, vomiting and abdominal pain.  Genitourinary: Negative for dysuria, urgency and hematuria.  Musculoskeletal: Positive for myalgias and arthralgias.  Skin: Negative for  rash.  Neurological: Negative for dizziness and headaches.  A complete 10 system review of systems was obtained and all systems are negative except as noted in the HPI and PMH.      Allergies  Review of patient's allergies indicates no known allergies.  Home Medications   Prior to Admission medications   Medication Sig Start Date End Date Taking? Authorizing Provider  Aspirin-Caffeine 500-32.5 MG TABS Take 1 tablet by mouth 2 (two) times daily.   Yes Historical Provider, MD  glucose blood (ACCU-CHEK ACTIVE STRIPS) test strip Use as instructed 08/30/13  Yes Shanker Kristeen Mans, MD  glucose monitoring kit (FREESTYLE) monitoring kit 1 each by Does not apply route as needed for other. 08/30/13  Yes Shanker Kristeen Mans, MD  HYDROcodone-acetaminophen (NORCO/VICODIN) 5-325 MG per tablet Take 1-2 tablets every 6 hours as needed for severe pain 01/25/15  Yes Carlisle Cater, PA-C  insulin aspart (NOVOLOG) 100 UNIT/ML injection Inject 14 Units into the skin 3 (three) times daily before meals. 01/08/15  Yes Lorayne Marek, MD  Insulin Glargine (LANTUS SOLOSTAR) 100 UNIT/ML Solostar Pen Inject 30 Units into the skin at bedtime. 04/12/14  Yes Theodis Blaze, MD  metFORMIN (GLUCOPHAGE-XR) 500 MG 24 hr tablet TAKE 2 TABLETS BY MOUTH TWICE DAILY WITH A MEAL Patient taking differently: Take 1,000 mg by mouth 2 (two) times daily.  08/11/14  Yes Lorayne Marek, MD  Multiple Vitamin (MULTIVITAMIN WITH MINERALS) TABS tablet Take 1 tablet by mouth daily.   Yes Historical Provider,  MD  phenytoin (DILANTIN) 100 MG ER capsule Take 1 capsule (100 mg total) by mouth 3 (three) times daily. 04/12/14  Yes Theodis Blaze, MD  quinapril-hydrochlorothiazide (ACCURETIC) 20-12.5 MG per tablet Take 1 tablet by mouth daily. 04/12/14  Yes Theodis Blaze, MD  naproxen (NAPROSYN) 500 MG tablet Take 1 tablet (500 mg total) by mouth 2 (two) times daily. 02/10/15   Ezequiel Essex, MD   BP 125/85 mmHg  Pulse 83  Temp(Src) 97.7 F (36.5 C) (Oral)   Resp 18  Ht 5' 8" (1.727 m)  Wt 255 lb (115.667 kg)  BMI 38.78 kg/m2  SpO2 98% Physical Exam  Constitutional: He is oriented to person, place, and time. He appears well-developed and well-nourished. No distress.  HENT:  Head: Normocephalic and atraumatic.  Mouth/Throat: Oropharynx is clear and moist. No oropharyngeal exudate.  Eyes: Conjunctivae and EOM are normal. Pupils are equal, round, and reactive to light.  Neck: Normal range of motion. Neck supple.  No meningismus.  Cardiovascular: Normal rate, regular rhythm, normal heart sounds and intact distal pulses.   No murmur heard. Pulmonary/Chest: Effort normal and breath sounds normal. No respiratory distress.  Abdominal: Soft. There is no tenderness. There is no rebound and no guarding.  Musculoskeletal: Normal range of motion. He exhibits tenderness. He exhibits no edema.  Pain with ROM L shoulder, L elbow, L wrist.  Intact radial pulse, cardinal hand movements intact.  Slightly decreased L grip strength which patient states is from pain.  Compartments soft. No skin changes. No cervical or upper back tenderness.  Neurological: He is alert and oriented to person, place, and time. No cranial nerve deficit. He exhibits normal muscle tone. Coordination normal.  No ataxia on finger to nose bilaterally. No pronator drift. 5/5 strength throughout. CN 2-12 intact. Negative Romberg. Equal grip strength. Sensation intact. Gait is normal.   Skin: Skin is warm.  Psychiatric: He has a normal mood and affect. His behavior is normal.  Nursing note and vitals reviewed.   ED Course  Procedures (including critical care time) Labs Review Labs Reviewed  BASIC METABOLIC PANEL - Abnormal; Notable for the following:    Sodium 130 (*)    Chloride 95 (*)    Glucose, Bld 280 (*)    All other components within normal limits  CK - Abnormal; Notable for the following:    Total CK 281 (*)    All other components within normal limits  CBC WITH  DIFFERENTIAL/PLATELET - Abnormal; Notable for the following:    Neutrophils Relative % 29 (*)    Neutro Abs 1.2 (*)    Lymphocytes Relative 60 (*)    All other components within normal limits  TROPONIN I    Imaging Review Dg Cervical Spine Complete  02/10/2015   CLINICAL DATA:  Recent electrocution with persistent shoulder and neck pain  EXAM: CERVICAL SPINE  4+ VIEWS  COMPARISON:  None.  FINDINGS: Seven cervical segments are well visualized. Vertebral body height is well maintained. Mild osteophytic changes are noted at C5-6 and C6-7. No significant neural foraminal narrowing is noted. No acute soft tissue abnormality is seen. No acute fracture or acute facet abnormality is noted.  IMPRESSION: Mild degenerative change without acute abnormality.   Electronically Signed   By: Inez Catalina M.D.   On: 02/10/2015 09:13   Dg Shoulder Left  02/10/2015   CLINICAL DATA:  Pt c/o continued left shoulder and neck pain s/p reported electrocution at his work on 01/25/15, pt states he  was holding a metal scraper in his left hand when electrical contact was made  EXAM: LEFT SHOULDER - 2+ VIEW  COMPARISON:  None.  FINDINGS: No fracture or dislocation.  There marginal spur from the inferior glenoid and inferior humeral head. Mild sclerosis is noted along the superior femoral which may be from subchondral cystic change. There are minor AC joint osteoarthritic changes.  Soft tissues are unremarkable  IMPRESSION: 1. No fracture or acute finding. 2. 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 osteoarthritis.   Electronically Signed   By: Lajean Manes M.D.   On: 02/10/2015 09:13     EKG Interpretation   Date/Time:  Saturday February 10 2015 08:07:56 EDT Ventricular Rate:  88 PR Interval:  190 QRS Duration: 100 QT Interval:  372 QTC Calculation: 450 R Axis:   81 Text Interpretation:  Sinus rhythm Baseline wander in lead(s) III aVL No  significant change was found Confirmed by Wyvonnia Dusky  MD, Raeleigh Guinn  ([id number]) on  02/10/2015 8:13:30 AM      MDM   Final diagnoses:  Arm pain  Hyperglycemia   intermittent left arm pain since electrical shock 17 days ago. Pain comes and goes lasting several hours at a time associated weakness in the arm due to pain. No chest pain or shortness of breath.  EKG is unchanged. Arm is neurovascularly intact with pain to range of motion of the shoulder and elbow.  X-rays negative for fracture or acute finding. Hyperglycemia without DKA. CK improved though mildly elevated. Not significantly elevated. Renal function is stable.  Workup is reassuring. Grip strength in the left arm is normal. Low suspicion for cervical radiculopathy. Encourage hydration at home, anti-inflammatories, follow-up with PCP. Return precautions discussed.  Ezequiel Essex, MD 02/10/15 1050

## 2015-02-20 ENCOUNTER — Encounter: Payer: Self-pay | Admitting: Internal Medicine

## 2015-02-20 ENCOUNTER — Ambulatory Visit: Payer: BLUE CROSS/BLUE SHIELD | Attending: Internal Medicine | Admitting: Internal Medicine

## 2015-02-20 VITALS — BP 131/90 | HR 98 | Temp 98.0°F | Resp 16 | Wt 249.6 lb

## 2015-02-20 DIAGNOSIS — I10 Essential (primary) hypertension: Secondary | ICD-10-CM | POA: Diagnosis not present

## 2015-02-20 DIAGNOSIS — E139 Other specified diabetes mellitus without complications: Secondary | ICD-10-CM

## 2015-02-20 DIAGNOSIS — E119 Type 2 diabetes mellitus without complications: Secondary | ICD-10-CM | POA: Diagnosis not present

## 2015-02-20 DIAGNOSIS — Z794 Long term (current) use of insulin: Secondary | ICD-10-CM | POA: Insufficient documentation

## 2015-02-20 DIAGNOSIS — G40909 Epilepsy, unspecified, not intractable, without status epilepticus: Secondary | ICD-10-CM | POA: Diagnosis not present

## 2015-02-20 LAB — COMPLETE METABOLIC PANEL WITH GFR
ALK PHOS: 60 U/L (ref 39–117)
ALT: 41 U/L (ref 0–53)
AST: 21 U/L (ref 0–37)
Albumin: 4 g/dL (ref 3.5–5.2)
BILIRUBIN TOTAL: 0.5 mg/dL (ref 0.2–1.2)
BUN: 14 mg/dL (ref 6–23)
CALCIUM: 9.2 mg/dL (ref 8.4–10.5)
CO2: 23 mEq/L (ref 19–32)
CREATININE: 0.68 mg/dL (ref 0.50–1.35)
Chloride: 99 mEq/L (ref 96–112)
GFR, Est African American: >89 mL/min
GLUCOSE: 261 mg/dL — AB (ref 70–99)
Potassium: 4 mEq/L (ref 3.5–5.3)
Sodium: 135 mEq/L (ref 135–145)
Total Protein: 7.4 g/dL (ref 6.0–8.3)

## 2015-02-20 LAB — LIPID PANEL
CHOL/HDL RATIO: 6 ratio
CHOLESTEROL: 221 mg/dL — AB (ref 0–200)
HDL: 37 mg/dL — ABNORMAL LOW (ref >=40)
TRIGLYCERIDES: 415 mg/dL — AB (ref <150)

## 2015-02-20 LAB — POCT GLYCOSYLATED HEMOGLOBIN (HGB A1C): HEMOGLOBIN A1C: 12.9

## 2015-02-20 LAB — GLUCOSE, POCT (MANUAL RESULT ENTRY): POC Glucose: 277 mg/dl — AB (ref 70–99)

## 2015-02-20 MED ORDER — QUINAPRIL-HYDROCHLOROTHIAZIDE 20-12.5 MG PO TABS
1.0000 | ORAL_TABLET | Freq: Every day | ORAL | Status: DC
Start: 2015-02-20 — End: 2017-12-07

## 2015-02-20 MED ORDER — METFORMIN HCL ER 500 MG PO TB24: ORAL_TABLET | ORAL | Status: DC

## 2015-02-20 MED ORDER — GLUCOSE BLOOD VI STRP: ORAL_STRIP | Status: DC

## 2015-02-20 NOTE — Patient Instructions (Signed)
Diabetes Mellitus and Food It is important for you to manage your blood sugar (glucose) level. Your blood glucose level can be greatly affected by what you eat. Eating healthier foods in the appropriate amounts throughout the day at about the same time each day will help you control your blood glucose level. It can also help slow or prevent worsening of your diabetes mellitus. Healthy eating may even help you improve the level of your blood pressure and reach or maintain a healthy weight.  HOW CAN FOOD AFFECT ME? Carbohydrates Carbohydrates affect your blood glucose level more than any other type of food. Your dietitian will help you determine how many carbohydrates to eat at each meal and teach you how to count carbohydrates. Counting carbohydrates is important to keep your blood glucose at a healthy level, especially if you are using insulin or taking certain medicines for diabetes mellitus. Alcohol Alcohol can cause sudden decreases in blood glucose (hypoglycemia), especially if you use insulin or take certain medicines for diabetes mellitus. Hypoglycemia can be a life-threatening condition. Symptoms of hypoglycemia (sleepiness, dizziness, and disorientation) are similar to symptoms of having too much alcohol.  If your health care provider has given you approval to drink alcohol, do so in moderation and use the following guidelines:  Women should not have more than one drink per day, and men should not have more than two drinks per day. One drink is equal to:  12 oz of beer.  5 oz of wine.  1 oz of hard liquor.  Do not drink on an empty stomach.  Keep yourself hydrated. Have water, diet soda, or unsweetened iced tea.  Regular soda, juice, and other mixers might contain a lot of carbohydrates and should be counted. WHAT FOODS ARE NOT RECOMMENDED? As you make food choices, it is important to remember that all foods are not the same. Some foods have fewer nutrients per serving than other  foods, even though they might have the same number of calories or carbohydrates. It is difficult to get your body what it needs when you eat foods with fewer nutrients. Examples of foods that you should avoid that are high in calories and carbohydrates but low in nutrients include:  Trans fats (most processed foods list trans fats on the Nutrition Facts label).  Regular soda.  Juice.  Candy.  Sweets, such as cake, pie, doughnuts, and cookies.  Fried foods. WHAT FOODS CAN I EAT? Have nutrient-rich foods, which will nourish your body and keep you healthy. The food you should eat also will depend on several factors, including:  The calories you need.  The medicines you take.  Your weight.  Your blood glucose level.  Your blood pressure level.  Your cholesterol level. You also should eat a variety of foods, including:  Protein, such as meat, poultry, fish, tofu, nuts, and seeds (lean animal proteins are best).  Fruits.  Vegetables.  Dairy products, such as milk, cheese, and yogurt (low fat is best).  Breads, grains, pasta, cereal, rice, and beans.  Fats such as olive oil, trans fat-free margarine, canola oil, avocado, and olives. DOES EVERYONE WITH DIABETES MELLITUS HAVE THE SAME MEAL PLAN? Because every person with diabetes mellitus is different, there is not one meal plan that works for everyone. It is very important that you meet with a dietitian who will help you create a meal plan that is just right for you. Document Released: 07/10/2005 Document Revised: 10/18/2013 Document Reviewed: 09/09/2013 [employer] Patient Information 2015 [employer]. This   information is not intended to replace advice given to you by your health care provider. Make sure you discuss any questions you have with your health care provider.  

## 2015-02-20 NOTE — Progress Notes (Signed)
MRN: [id number] Name: Albert Garcia  Sex: male Age: 45 y.o. DOB: 1970-11-20  Allergies: Review of patient's allergies indicates no known allergies.  Chief Complaint  Patient presents with  . Annual Exam    HPI: Patient is 44 y.o. male who has history of diabetes hypertension, seizure disorder, was seen in this office last year, comes today requesting refill on his medications, his hemoglobin A1c has trended up compared to last visit, as per patient he is taking his medications , currently denies any headache dizziness chest and shortness of breath, he denies any recent seizures, has lost follow up with his neurologist.also patient has not seen an ophthalmologist for several years. Currently denies any blurry vision.  Past Medical History  Diagnosis Date  . Diabetes mellitus   . Seizures   . Hypertension     History reviewed. No pertinent past surgical history.    Medication List       This list is accurate as of: 02/20/15  3:06 PM.  Always use your most recent med list.               Aspirin-Caffeine 500-32.5 MG Tabs  Take 1 tablet by mouth 2 (two) times daily.     glimepiride 4 MG tablet  Commonly known as:  AMARYL  Take 4 mg by mouth daily with breakfast.     glucose blood test strip  Commonly known as:  ACCU-CHEK ACTIVE STRIPS  Use as instructed     glucose monitoring kit monitoring kit  1 each by Does not apply route as needed for other.     HYDROcodone-acetaminophen 5-325 MG per tablet  Commonly known as:  NORCO/VICODIN  Take 1-2 tablets every 6 hours as needed for severe pain     insulin aspart 100 UNIT/ML injection  Commonly known as:  novoLOG  Inject 14 Units into the skin 3 (three) times daily before meals.     Insulin Glargine 100 UNIT/ML Solostar Pen  Commonly known as:  LANTUS SOLOSTAR  Inject 30 Units into the skin at bedtime.     metFORMIN 500 MG 24 hr tablet  Commonly known as:  GLUCOPHAGE-XR  TAKE 2 TABLETS BY MOUTH TWICE DAILY WITH A  MEAL     multivitamin with minerals Tabs tablet  Take 1 tablet by mouth daily.     naproxen 500 MG tablet  Commonly known as:  NAPROSYN  Take 1 tablet (500 mg total) by mouth 2 (two) times daily.     phenytoin 100 MG ER capsule  Commonly known as:  DILANTIN  Take 1 capsule (100 mg total) by mouth 3 (three) times daily.     quinapril-hydrochlorothiazide 20-12.5 MG per tablet  Commonly known as:  ACCURETIC  Take 1 tablet by mouth daily.        Meds ordered this encounter  Medications  . glimepiride (AMARYL) 4 MG tablet    Sig: Take 4 mg by mouth daily with breakfast.  . metFORMIN (GLUCOPHAGE-XR) 500 MG 24 hr tablet    Sig: TAKE 2 TABLETS BY MOUTH TWICE DAILY WITH A MEAL    Dispense:  180 tablet    Refill:  7    CYCLE FILL MEDICATION. Authorization is required for next refill.  . quinapril-hydrochlorothiazide (ACCURETIC) 20-12.5 MG per tablet    Sig: Take 1 tablet by mouth daily.    Dispense:  90 tablet    Refill:  7  . glucose blood (ACCU-CHEK ACTIVE STRIPS) test strip    Sig:  Use as instructed    Dispense:  100 each    Refill:  12    Immunization History  Administered Date(s) Administered  . Influenza Whole 08/19/2006  . Pneumococcal Polysaccharide-23 11/27/2004  . Tdap 01/16/2013    Family History  Problem Relation Age of Onset  . Hypertension Mother   . Diabetes Mother   . Lupus Mother   . Kidney failure Mother   . Diabetes Father   . Hypertension Father   . Kidney failure Father     History  Substance Use Topics  . Smoking status: Never Smoker   . Smokeless tobacco: Not on file  . Alcohol Use: Yes    Review of Systems  Constitutional: Negative for fatigue.  Genitourinary: Negative for dysuria.  Neurological: Negative for seizures, weakness, light-headedness and numbness.     As noted in HPI  Filed Vitals:   02/20/15 1128  BP: 131/90  Pulse: 98  Temp: 98 F (36.7 C)  Resp: 16    Physical Exam  Physical Exam  Constitutional: No  distress.  Eyes: EOM are normal. Pupils are equal, round, and reactive to light.  Neck: Neck supple.  Cardiovascular: Normal rate and regular rhythm.   Pulmonary/Chest: Breath sounds normal. No respiratory distress. He has no wheezes. He has no rales.  Abdominal: Soft. There is no tenderness. There is no rebound.    CBC    Component Value Date/Time   WBC 4.3 02/10/2015 0830   RBC 4.43 02/10/2015 0830   HGB 14.4 02/10/2015 0830   HCT 40.3 02/10/2015 0830   PLT 231 02/10/2015 0830   MCV 91.0 02/10/2015 0830   LYMPHSABS 2.6 02/10/2015 0830   MONOABS 0.4 02/10/2015 0830   EOSABS 0.1 02/10/2015 0830   BASOSABS 0.0 02/10/2015 0830    CMP     Component Value Date/Time   NA 130* 02/10/2015 0830   K 3.7 02/10/2015 0830   CL 95* 02/10/2015 0830   CO2 27 02/10/2015 0830   GLUCOSE 280* 02/10/2015 0830   BUN 17 02/10/2015 0830   CREATININE 0.74 02/10/2015 0830   CREATININE 0.90 04/12/2014 1231   CALCIUM 9.1 02/10/2015 0830   PROT 8.0 04/12/2014 1231   ALBUMIN 4.7 04/12/2014 1231   AST 26 04/12/2014 1231   ALT 34 04/12/2014 1231   ALKPHOS 56 04/12/2014 1231   BILITOT 1.3* 04/12/2014 1231   GFRNONAA >90 02/10/2015 0830   GFRAA >90 02/10/2015 0830    Lab Results  Component Value Date/Time   CHOL 105 04/12/2014 12:31 PM    Lab Results  Component Value Date/Time   HGBA1C 12.90 02/20/2015 11:31 AM   HGBA1C 11.8* 04/12/2014 12:31 PM    Lab Results  Component Value Date/Time   AST 26 04/12/2014 12:31 PM    Assessment and Plan  Other specified diabetes mellitus without complications - Plan:  Results for orders placed or performed in visit on 02/20/15  Glucose (CBG)  Result Value Ref Range   POC Glucose 277 (A) 70 - 99 mg/dl  HgB A1c  Result Value Ref Range   Hemoglobin A1C 12.90    Diabetes is uncontrolled, have advised patient for diabetes meal planning, will increase the dose of Lantus to 33 units as well as NovoLog to 15 units 3 times with meals continue with  metFORMIN (GLUCOPHAGE-XR) 500 MG 24 hr tablet, Lipid panel, Vit D  25 hydroxy (rtn osteoporosis monitoring), Ambulatory referral to Ophthalmology, glucose blood (ACCU-CHEK ACTIVE STRIPS) test strip  Essential hypertension - Plan:advised patient for  DASH diet, continue with  quinapril-hydrochlorothiazide (ACCURETIC) 20-12.5 MG per tablet, COMPLETE METABOLIC PANEL WITH GFR  Seizure disorder - Plan currently patient is on Dilantin, Ambulatory referral to Neurology   Return in about 3 months (around 05/22/2015) for diabetes.   This note has been created with Surveyor, quantity. Any transcriptional errors are unintentional.    Lorayne Marek, MD

## 2015-02-20 NOTE — Progress Notes (Signed)
Patient here for annual physical for his hypertension and diabetes Patient states the metformin goes right though him Needs some refills on his medications

## 2015-02-21 ENCOUNTER — Telehealth: Payer: Self-pay

## 2015-02-21 ENCOUNTER — Other Ambulatory Visit: Payer: Self-pay

## 2015-02-21 LAB — VITAMIN D 25 HYDROXY (VIT D DEFICIENCY, FRACTURES): Vit D, 25-Hydroxy: 18 ng/mL — ABNORMAL LOW (ref 30–100)

## 2015-02-21 MED ORDER — GEMFIBROZIL 600 MG PO TABS: 600.0000 mg | ORAL_TABLET | Freq: Two times a day (BID) | ORAL | Status: DC

## 2015-02-21 NOTE — Telephone Encounter (Signed)
-----   Message from Doris Cheadleeepak Advani, MD sent at 02/21/2015  9:48 AM EDT ----- Blood work reviewed, noticed low vitamin D, call patient advise to start ergocalciferol 50,000 units once a week for the duration of  12 weeks, then take OTC vitamin d 2000 units daily.  noticed elevated triglycerides, advise patient for low fat diet. And start taking lopid 600 mg twice a day, will recheck fasting lipid panel on the next visit.

## 2015-02-21 NOTE — Telephone Encounter (Signed)
Patient is aware of his lab results Prescriptions sent to [facility] per patient request

## 2015-02-28 ENCOUNTER — Telehealth: Payer: Self-pay | Admitting: *Deleted

## 2015-02-28 MED ORDER — VITAMIN D (ERGOCALCIFEROL) 1.25 MG (50000 UNIT) PO CAPS: 50000.0000 [IU] | ORAL_CAPSULE | ORAL | Status: DC

## 2015-02-28 NOTE — Telephone Encounter (Signed)
Patient called to say [facility] [place] [facility]  Resent Rx for patient.

## 2015-03-19 ENCOUNTER — Telehealth: Payer: Self-pay

## 2015-03-19 ENCOUNTER — Telehealth: Payer: Self-pay | Admitting: Internal Medicine

## 2015-03-19 MED ORDER — INSULIN ASPART 100 UNIT/ML ~~LOC~~ SOLN: 14.0000 [IU] | Freq: Three times a day (TID) | SUBCUTANEOUS | Status: DC

## 2015-03-19 MED ORDER — GEMFIBROZIL 600 MG PO TABS
600.0000 mg | ORAL_TABLET | Freq: Two times a day (BID) | ORAL | Status: DC
Start: 2015-03-19 — End: 2017-12-07

## 2015-03-19 MED ORDER — PHENYTOIN SODIUM EXTENDED 100 MG PO CAPS: 100.0000 mg | ORAL_CAPSULE | Freq: Three times a day (TID) | ORAL | Status: DC

## 2015-03-19 MED ORDER — INSULIN GLARGINE 100 UNIT/ML SOLOSTAR PEN: 30.0000 [IU] | PEN_INJECTOR | Freq: Every day | SUBCUTANEOUS | Status: DC

## 2015-03-19 NOTE — Telephone Encounter (Signed)
Attempted to call patient about his medication request Patient not available Message left on voice mail to return our call Medications sent to the health department

## 2015-03-19 NOTE — Telephone Encounter (Signed)
Pt came into office requesting medication refill on most of his medications.[facility]. Patient needs refill on insulin aspart (NOVOLOG) 100 UNIT/ML injection,Insulin Glargine (LANTUS SOLOSTAR) 100 UNIT/ML Solostar Pen,phenytoin (DILANTIN) 100 MG ER capsule, and CRESTOR. Please f/u with patient

## 2015-04-20 ENCOUNTER — Ambulatory Visit: Payer: Self-pay | Admitting: Neurology

## 2015-04-24 ENCOUNTER — Telehealth: Payer: Self-pay | Admitting: Internal Medicine

## 2015-04-24 NOTE — Telephone Encounter (Signed)
Patient says that he took his printed copy of his Vitamin D, Ergocalciferol, (DRISDOL) 50000 UNITS CAPS capsule to [facility] and they told him that they dont fill this Rx there. He says they never returned his prescription, hence he never got it filled. Please follow up with pt.

## 2015-04-24 NOTE — Telephone Encounter (Signed)
Patient is requesting a refill for Vitamin D, Ergocalciferol, (DRISDOL) 50000 UNITS CAPS capsule. Please follow up with pt.

## 2015-04-25 ENCOUNTER — Telehealth: Payer: Self-pay

## 2015-04-25 MED ORDER — VITAMIN D (ERGOCALCIFEROL) 1.25 MG (50000 UNIT) PO CAPS: 50000.0000 [IU] | ORAL_CAPSULE | ORAL | Status: AC

## 2015-04-25 NOTE — Telephone Encounter (Signed)
Patient states he had received a printed copy for vitamin D ant took the prescription to the [facility] and was told they can not fill it.  Patient did not get the prescription back and has yet to get  It filled.  [facility]

## 2015-05-07 ENCOUNTER — Ambulatory Visit: Payer: Self-pay | Admitting: Internal Medicine

## 2015-05-24 ENCOUNTER — Other Ambulatory Visit: Payer: Self-pay

## 2015-05-24 MED ORDER — INSULIN GLARGINE 100 UNIT/ML SOLOSTAR PEN: 30.0000 [IU] | PEN_INJECTOR | Freq: Every day | SUBCUTANEOUS | Status: DC

## 2015-06-08 ENCOUNTER — Ambulatory Visit: Payer: Self-pay | Admitting: Neurology

## 2015-06-14 ENCOUNTER — Encounter: Payer: Self-pay | Admitting: *Deleted

## 2015-06-14 NOTE — Progress Notes (Signed)
No show letter sent for missed appointment on 06/08/2015 

## 2015-06-17 ENCOUNTER — Encounter (HOSPITAL_COMMUNITY): Payer: Self-pay | Admitting: Emergency Medicine

## 2015-06-17 ENCOUNTER — Emergency Department (HOSPITAL_COMMUNITY)
Admission: EM | Admit: 2015-06-17 | Discharge: 2015-06-17 | Disposition: A | Discharge status: Home / Self Care | Payer: Worker's Compensation | Attending: Emergency Medicine | Admitting: Emergency Medicine

## 2015-06-17 DIAGNOSIS — Z79899 Other long term (current) drug therapy: Secondary | ICD-10-CM | POA: Insufficient documentation

## 2015-06-17 DIAGNOSIS — Z794 Long term (current) use of insulin: Secondary | ICD-10-CM | POA: Insufficient documentation

## 2015-06-17 DIAGNOSIS — M25512 Pain in left shoulder: Secondary | ICD-10-CM

## 2015-06-17 DIAGNOSIS — E119 Type 2 diabetes mellitus without complications: Secondary | ICD-10-CM | POA: Insufficient documentation

## 2015-06-17 DIAGNOSIS — Z791 Long term (current) use of non-steroidal anti-inflammatories (NSAID): Secondary | ICD-10-CM | POA: Diagnosis not present

## 2015-06-17 DIAGNOSIS — I10 Essential (primary) hypertension: Secondary | ICD-10-CM | POA: Insufficient documentation

## 2015-06-17 DIAGNOSIS — G40909 Epilepsy, unspecified, not intractable, without status epilepticus: Secondary | ICD-10-CM | POA: Insufficient documentation

## 2015-06-17 MED ORDER — MELOXICAM 7.5 MG PO TABS
7.5000 mg | ORAL_TABLET | ORAL | Status: AC
  Administered 2015-06-17: 7.5 mg via ORAL
  Filled 2015-06-17: qty 1

## 2015-06-17 MED ORDER — MELOXICAM 7.5 MG PO TABS: 7.5000 mg | ORAL_TABLET | Freq: Two times a day (BID) | ORAL | Status: DC
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
## 2015-06-17 NOTE — Discharge Instructions (Signed)
Arthralgia Arthralgia is joint pain. A joint is a place where two bones meet. Joint pain can happen for many reasons. The joint can be bruised, stiff, infected, or weak from aging. Pain usually goes away after resting and taking medicine for soreness.  HOME CARE  Rest the joint as told by your doctor.  Keep the sore joint raised (elevated) for the first 24 hours.  Put ice on the joint area.  Put ice in a plastic bag.  Place a towel between your skin and the bag.  Leave the ice on for 15-20 minutes, 03-04 times a day.  Wear your splint, casting, elastic bandage, or sling as told by your doctor.  Only take medicine as told by your doctor. Do not take aspirin.  Use crutches as told by your doctor. Do not put weight on the joint until told to by your doctor. GET HELP RIGHT AWAY IF:   You have bruising, puffiness (swelling), or more pain.  Your fingers or toes turn blue or start to lose feeling (numb).  Your medicine does not lessen the pain.  Your pain becomes severe.  You have a temperature by mouth above 102 F (38.9 C), not controlled by medicine.  You cannot move or use the joint. MAKE SURE YOU:   Understand these instructions.  Will watch your condition.  Will get help right away if you are not doing well or get worse. Document Released: 10/01/2009 Document Revised: 01/05/2012 Document Reviewed: 10/01/2009 [facility] Patient Information 2015 [place], [place]. This information is not intended to replace advice given to you by your health care provider. Make sure you discuss any questions you have with your health care provider. 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 Luiz Blare, orthopedic surgeon, please call and make an appointment tell them you referred through the emergency department.  They will work with you to get an expedient  appointment.  Also keep your appointment with your community wellness physician to schedule

## 2015-08-07 ENCOUNTER — Other Ambulatory Visit: Payer: Self-pay

## 2015-08-07 MED ORDER — PHENYTOIN SODIUM EXTENDED 100 MG PO CAPS: 100.0000 mg | ORAL_CAPSULE | Freq: Three times a day (TID) | ORAL | Status: DC

## 2015-08-23 ENCOUNTER — Encounter (HOSPITAL_BASED_OUTPATIENT_CLINIC_OR_DEPARTMENT_OTHER): Payer: Self-pay | Admitting: *Deleted

## 2015-08-23 ENCOUNTER — Emergency Department (HOSPITAL_BASED_OUTPATIENT_CLINIC_OR_DEPARTMENT_OTHER)
Admission: EM | Admit: 2015-08-23 | Discharge: 2015-08-23 | Disposition: A | Discharge status: Home / Self Care | Payer: Self-pay | Attending: Emergency Medicine | Admitting: Emergency Medicine

## 2015-08-23 DIAGNOSIS — G40909 Epilepsy, unspecified, not intractable, without status epilepticus: Secondary | ICD-10-CM | POA: Insufficient documentation

## 2015-08-23 DIAGNOSIS — R252 Cramp and spasm: Secondary | ICD-10-CM | POA: Insufficient documentation

## 2015-08-23 DIAGNOSIS — Z794 Long term (current) use of insulin: Secondary | ICD-10-CM | POA: Insufficient documentation

## 2015-08-23 DIAGNOSIS — E119 Type 2 diabetes mellitus without complications: Secondary | ICD-10-CM | POA: Insufficient documentation

## 2015-08-23 DIAGNOSIS — Z791 Long term (current) use of non-steroidal anti-inflammatories (NSAID): Secondary | ICD-10-CM | POA: Insufficient documentation

## 2015-08-23 DIAGNOSIS — Z79899 Other long term (current) drug therapy: Secondary | ICD-10-CM | POA: Insufficient documentation

## 2015-08-23 DIAGNOSIS — G4762 Sleep related leg cramps: Secondary | ICD-10-CM

## 2015-08-23 DIAGNOSIS — I10 Essential (primary) hypertension: Secondary | ICD-10-CM | POA: Insufficient documentation

## 2015-08-23 LAB — BASIC METABOLIC PANEL
Anion gap: 3 — ABNORMAL LOW (ref 5–15)
BUN: 13 mg/dL (ref 6–20)
CALCIUM: 8.8 mg/dL — AB (ref 8.9–10.3)
CHLORIDE: 104 mmol/L (ref 101–111)
CO2: 28 mmol/L (ref 22–32)
Creatinine, Ser: 0.68 mg/dL (ref 0.61–1.24)
GFR calc Af Amer: >60 mL/min (ref >60)
GFR calc non Af Amer: >60 mL/min (ref >60)
Glucose, Bld: 279 mg/dL — ABNORMAL HIGH (ref 65–99)
Potassium: 3.3 mmol/L — ABNORMAL LOW (ref 3.5–5.1)
SODIUM: 135 mmol/L (ref 135–145)

## 2015-08-23 MED ORDER — POTASSIUM CHLORIDE CRYS ER 20 MEQ PO TBCR: 20.0000 meq | EXTENDED_RELEASE_TABLET | Freq: Every day | ORAL | Status: DC

## 2015-08-23 MED ORDER — DIAZEPAM 10 MG PO TABS: ORAL_TABLET | ORAL | Status: DC

## 2015-08-23 MED ORDER — POTASSIUM CHLORIDE CRYS ER 20 MEQ PO TBCR
40.0000 meq | EXTENDED_RELEASE_TABLET | Freq: Once | ORAL | Status: AC
  Administered 2015-08-23: 40 meq via ORAL
  Filled 2015-08-23: qty 2

## 2015-08-23 NOTE — Discharge Instructions (Signed)
Leg Cramps Leg cramps occur when a muscle or muscles tighten and you have no control over this tightening (involuntary muscle contraction). Muscle cramps can develop in any muscle, but the most common place is in the calf muscles of the leg. Those cramps can occur during exercise or when you are at rest. Leg cramps are painful, and they may last for a few seconds to a few minutes. Cramps may return several times before they finally stop. Usually, leg cramps are not caused by a serious medical problem. In many cases, the cause is not known. Some common causes include:  Overexertion.  Overuse from repetitive motions, or doing the same thing over and over.  Remaining in a certain position for a long period of time.  Improper preparation, form, or technique while performing a sport or an activity.  Dehydration.  Injury.  Side effects of some medicines.  Abnormally low levels of the salts and ions in your blood (electrolytes), especially potassium and calcium. These levels could be low if you are taking water pills (diuretics) or if you are pregnant. HOME CARE INSTRUCTIONS Watch your condition for any changes. Taking the following actions may help to lessen any discomfort that you are feeling:  Stay well-hydrated. Drink enough fluid to keep your urine clear or pale yellow.  Try massaging, stretching, and relaxing the affected muscle. Do this for several minutes at a time.  For tight or tense muscles, use a warm towel, heating pad, or hot shower water directed to the affected area.  If you are sore or have pain after a cramp, applying ice to the affected area may relieve discomfort.  Put ice in a plastic bag.  Place a towel between your skin and the bag.  Leave the ice on for 20 minutes, 2-3 times per day.  Avoid strenuous exercise for several days if you have been having frequent leg cramps.  Make sure that your diet includes the essential minerals for your muscles to work  normally.  Take medicines only as directed by your health care provider. SEEK MEDICAL CARE IF:  Your leg cramps get more severe or more frequent, or they do not improve over time.  Your foot becomes cold, numb, or blue.   This information is not intended to replace advice given to you by your health care provider. Make sure you discuss any questions you have with your health care provider.   Document Released: 11/20/2004 Document Revised: 02/27/2015 Document Reviewed: 09/20/2014 Elsevier Interactive Patient Education 2016 [employer] Inc[employer]   

## 2015-08-23 NOTE — ED Notes (Signed)
C/o leg cramps off and on x 2 weeks,  Also ha to back of head off and on

## 2015-08-23 NOTE — ED Notes (Signed)
C/o bilateral leg cramps x 2-3 weeks,  Ha to back of head off and on w knots

## 2015-08-23 NOTE — ED Notes (Signed)
Pt verbalizes understanding of d/c instructions and denies any further needs at this time. 

## 2015-08-23 NOTE — ED Provider Notes (Signed)
CSN: [id number]     Arrival date & time 08/23/15  0502 History   First MD Initiated Contact with Patient 08/23/15 [phone]     Chief Complaint  Patient presents with  . Leg Pain     (Consider location/radiation/quality/duration/timing/severity/associated sxs/prior Treatment) HPI  This is a 44 year old male with a history of diabetes. He has had about a two-week history of cramping in his lower legs. These cramps occur at night. They occurred this morning about 2 AM and were severe. His significant other palpated his calves and felt a cramp in his right calf. She she massages this until the pain and spasm improved. He is pain-free at the present time. He has also taken mustard in an effort to relieve the pain.  He is also complaining of a painful rash to the back of his lower occipital scalp.  Past Medical History  Diagnosis Date  . Diabetes mellitus   . Seizures ([place])   . Hypertension    History reviewed. No pertinent past surgical history. Family History  Problem Relation Age of Onset  . Hypertension Mother   . Diabetes Mother   . Lupus Mother   . Kidney failure Mother   . Diabetes Father   . Hypertension Father   . Kidney failure Father    Social History  Substance Use Topics  . Smoking status: Never Smoker   . Smokeless tobacco: None  . Alcohol Use: Yes    Review of Systems  All other systems reviewed and are negative.   Allergies  Review of patient's allergies indicates no known allergies.  Home Medications   Prior to Admission medications   Medication Sig Start Date End Date Taking? Authorizing Provider  acetaminophen (ARTHRITIS PAIN RELIEF) 650 MG CR tablet Take 650 mg by mouth every 8 (eight) hours as needed for pain.    Historical Provider, MD  Aspirin-Caffeine 500-32.5 MG TABS Take 1 tablet by mouth 2 (two) times daily.    Historical Provider, MD  gemfibrozil (LOPID) 600 MG tablet Take 1 tablet (600 mg total) by mouth 2 (two) times daily before a  meal. Patient not taking: Reported on 06/17/2015 03/19/15   Lorayne Marek, MD  glimepiride (AMARYL) 4 MG tablet Take 4 mg by mouth daily with breakfast.    Historical Provider, MD  glucose blood (ACCU-CHEK ACTIVE STRIPS) test strip Use as instructed 02/20/15   Lorayne Marek, MD  glucose monitoring kit (FREESTYLE) monitoring kit 1 each by Does not apply route as needed for other. 08/30/13   Shanker Kristeen Mans, MD  HYDROcodone-acetaminophen (NORCO/VICODIN) 5-325 MG per tablet Take 1-2 tablets every 6 hours as needed for severe pain Patient not taking: Reported on 06/17/2015 01/25/15   Carlisle Cater, PA-C  insulin aspart (NOVOLOG) 100 UNIT/ML injection Inject 14 Units into the skin 3 (three) times daily before meals. Patient taking differently: Inject 15 Units into the skin 3 (three) times daily before meals.  03/19/15   Lorayne Marek, MD  Insulin Glargine (LANTUS SOLOSTAR) 100 UNIT/ML Solostar Pen Inject 30 Units into the skin at bedtime. Patient taking differently: Inject 35 Units into the skin at bedtime.  05/24/15   Lorayne Marek, MD  meloxicam (MOBIC) 7.5 MG tablet Take 1 tablet (7.5 mg total) by mouth 2 (two) times daily. 06/17/15   Junius Creamer, NP  metFORMIN (GLUCOPHAGE-XR) 500 MG 24 hr tablet TAKE 2 TABLETS BY MOUTH TWICE DAILY WITH A MEAL Patient taking differently: Take 1,000 mg by mouth 2 (two) times daily. TAKE 2 TABLETS BY  MOUTH TWICE DAILY WITH A MEAL 02/20/15   Lorayne Marek, MD  Multiple Vitamin (MULTIVITAMIN WITH MINERALS) TABS tablet Take 1 tablet by mouth daily.    Historical Provider, MD  naproxen (NAPROSYN) 500 MG tablet Take 1 tablet (500 mg total) by mouth 2 (two) times daily. Patient not taking: Reported on 06/17/2015 02/10/15   Ezequiel Essex, MD  phenytoin (DILANTIN) 100 MG ER capsule Take 1 capsule (100 mg total) by mouth 3 (three) times daily. 08/07/15   Josalyn Funches, MD  quinapril-hydrochlorothiazide (ACCURETIC) 20-12.5 MG per tablet Take 1 tablet by mouth daily. Patient not  taking: Reported on 06/17/2015 02/20/15   Lorayne Marek, MD   BP 160/94 mmHg  Pulse 89  Temp(Src) 98.8 F (37.1 C) (Oral)  Resp 17  Ht _0  (1.727 m)  Wt 250 lb (113.399 kg)  BMI 38.02 kg/m2  SpO2 98%   Physical Exam  General: Well-developed, well-nourished male in no acute distress; appearance consistent with age of record HENT: normocephalic; atraumatic Eyes: pupils equal, round and reactive to light; extraocular muscles intact Neck: supple Heart: regular rate and rhythm Lungs: clear to auscultation bilaterally Abdomen: soft; nondistended Extremities: No deformity; full range of motion; pulses normal; no calf tenderness or edema Neurologic: Awake, alert and oriented; motor function intact in all extremities and symmetric; no facial droop Skin: Warm and dry; maculopapular rash of the skin of the inferior occipital scalp Psychiatric: Normal mood and affect    ED Course  Procedures (including critical care time)   MDM  Nursing notes and vitals signs, including pulse oximetry, reviewed.  Summary of this visit's results, reviewed by myself:  Labs:  Results for orders placed or performed during the hospital encounter of 08/23/15 (from the past 24 hour(s))  Basic metabolic panel     Status: Abnormal   Collection Time: 08/23/15  5:45 AM  Result Value Ref Range   Sodium 135 135 - 145 mmol/L   Potassium 3.3 (L) 3.5 - 5.1 mmol/L   Chloride 104 101 - 111 mmol/L   CO2 28 22 - 32 mmol/L   Glucose, Bld 279 (H) 65 - 99 mg/dL   BUN 13 6 - 20 mg/dL   Creatinine, Ser 0.68 0.61 - 1.24 mg/dL   Calcium 8.8 (L) 8.9 - 10.3 mg/dL   GFR calc non Af Amer >60 >60 mL/min   GFR calc Af Amer >60 >60 mL/min   Anion gap 3 (L) 5 - 15   Patient advised of laboratory findings. We will give him some additional potassium in the ED and he was advised to start on calcium supplementation. We will provide Valium for treatment of muscle spasms when they occur.     Shanon Rosser, MD 08/23/15 [phone]

## 2015-08-28 ENCOUNTER — Encounter (HOSPITAL_BASED_OUTPATIENT_CLINIC_OR_DEPARTMENT_OTHER): Payer: Self-pay | Admitting: Emergency Medicine

## 2015-08-28 DIAGNOSIS — G40909 Epilepsy, unspecified, not intractable, without status epilepticus: Secondary | ICD-10-CM | POA: Insufficient documentation

## 2015-08-28 DIAGNOSIS — I10 Essential (primary) hypertension: Secondary | ICD-10-CM | POA: Insufficient documentation

## 2015-08-28 DIAGNOSIS — E119 Type 2 diabetes mellitus without complications: Secondary | ICD-10-CM | POA: Insufficient documentation

## 2015-08-28 DIAGNOSIS — Z794 Long term (current) use of insulin: Secondary | ICD-10-CM | POA: Insufficient documentation

## 2015-08-28 DIAGNOSIS — Z79899 Other long term (current) drug therapy: Secondary | ICD-10-CM | POA: Insufficient documentation

## 2015-08-28 DIAGNOSIS — L03012 Cellulitis of left finger: Secondary | ICD-10-CM | POA: Insufficient documentation

## 2015-08-28 NOTE — ED Notes (Signed)
Patient states that he has left middle infection starting today

## 2015-08-29 ENCOUNTER — Emergency Department (HOSPITAL_BASED_OUTPATIENT_CLINIC_OR_DEPARTMENT_OTHER)
Admission: EM | Admit: 2015-08-29 | Discharge: 2015-08-29 | Disposition: A | Discharge status: Home / Self Care | Payer: Self-pay | Attending: Emergency Medicine | Admitting: Emergency Medicine

## 2015-08-29 DIAGNOSIS — L03012 Cellulitis of left finger: Secondary | ICD-10-CM

## 2015-08-29 MED ORDER — LIDOCAINE HCL (PF) 1 % IJ SOLN
INTRAMUSCULAR | Status: AC
  Administered 2015-08-29: 5 mL
  Filled 2015-08-29: qty 5

## 2015-08-29 MED ORDER — LIDOCAINE HCL (PF) 1 % IJ SOLN
5.0000 mL | Freq: Once | INTRAMUSCULAR | Status: AC
  Administered 2015-08-29: 5 mL

## 2015-08-29 NOTE — Discharge Instructions (Signed)
Paronychia  °Paronychia is an infection of the skin. It happens near a fingernail or toenail. It may cause pain and swelling around the nail. Usually, it is not serious and it clears up with treatment. °HOME CARE °· Soak the fingers or toes in warm water as told by your doctor. You may be told to do this for 20 minutes, 2-3 times a day. °· Keep the area dry when you are not soaking it. °· Take medicines only as told by your doctor. °· If you were given an antibiotic medicine, finish all of it even if you start to feel better. °· Keep the affected area clean. °· Do not try to drain a fluid-filled bump yourself. °· Wear rubber gloves when putting your hands in water. °· Wear gloves if your hands might touch cleaners or chemicals. °· Follow your doctor's instructions about: °¨ Wound care. °¨ Bandage (dressing) changes and removal. °GET HELP IF: °· Your symptoms get worse or do not improve. °· You have a fever or chills. °· You have redness spreading from the affected area. °· You have more fluid, blood, or pus coming from the affected area. °· Your finger or knuckle is swollen or is hard to move. °  °This information is not intended to replace advice given to you by your health care provider. Make sure you discuss any questions you have with your health care provider. °  °Document Released: 10/01/2009 Document Revised: 02/27/2015 Document Reviewed: 09/20/2014 °Elsevier Interactive Patient Education ©2016 Elsevier Inc. ° °

## 2015-08-29 NOTE — ED Provider Notes (Signed)
CSN: [id number]     Arrival date & time 08/28/15  2317 History   First MD Initiated Contact with Patient 08/29/15 0026     Chief Complaint  Patient presents with  . Hand Pain     (Consider location/radiation/quality/duration/timing/severity/associated sxs/prior Treatment) Patient is a 44 y.o. male presenting with hand pain. The history is provided by the patient.  Hand Pain This is a new problem. The current episode started 12 to 24 hours ago. The problem occurs constantly. The problem has been gradually worsening. Associated symptoms comments: Left 3rd digit pain, swelling.  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 The symptoms are aggravated by bending. Nothing relieves the symptoms. He has tried nothing for the symptoms. The treatment provided no relief.    Past Medical History  Diagnosis Date  . Diabetes mellitus   . Seizures ([place])   . Hypertension    History reviewed. No pertinent past surgical history. Family History  Problem Relation Age of Onset  . Hypertension Mother   . Diabetes Mother   . Lupus Mother   . Kidney failure Mother   . Diabetes Father   . Hypertension Father   . Kidney failure Father    Social History  Substance Use Topics  . Smoking status: Never Smoker   . Smokeless tobacco: None  . Alcohol Use: Yes    Review of Systems  All other systems reviewed and are negative.     Allergies  Review of patient's allergies indicates no known allergies.  Home Medications   Prior to Admission medications   Medication Sig Start Date End Date Taking? Authorizing Provider  acetaminophen (ARTHRITIS PAIN RELIEF) 650 MG CR tablet Take 650 mg by mouth every 8 (eight) hours as needed for pain.    Historical Provider, MD  Aspirin-Caffeine 500-32.5 MG TABS Take 1 tablet by mouth 2 (two) times daily.    Historical Provider, MD  diazepam (VALIUM) 10 MG tablet Take 1 tablet nightly as needed for nocturnal leg cramps.  08/23/15   John Molpus, MD  glimepiride (AMARYL) 4 MG tablet Take 4 mg by mouth daily with breakfast.    Historical Provider, MD  glucose blood (ACCU-CHEK ACTIVE STRIPS) test strip Use as instructed 02/20/15   Lorayne Marek, MD  glucose monitoring kit (FREESTYLE) monitoring kit 1 each by Does not apply route as needed for other. 08/30/13   Shanker Kristeen Mans, MD  insulin aspart (NOVOLOG) 100 UNIT/ML injection Inject 14 Units into the skin 3 (three) times daily before meals. Patient taking differently: Inject 15 Units into the skin 3 (three) times daily before meals.  03/19/15   Lorayne Marek, MD  Insulin Glargine (LANTUS SOLOSTAR) 100 UNIT/ML Solostar Pen Inject 30 Units into the skin at bedtime. Patient taking differently: Inject 35 Units into the skin at bedtime.  05/24/15   Lorayne Marek, MD  metFORMIN (GLUCOPHAGE-XR) 500 MG 24 hr tablet TAKE 2 TABLETS BY MOUTH TWICE DAILY WITH A MEAL Patient taking differently: Take 1,000 mg by mouth 2 (two) times daily. TAKE 2 TABLETS BY MOUTH TWICE DAILY WITH A MEAL 02/20/15   Lorayne Marek, MD  Multiple Vitamin (MULTIVITAMIN WITH MINERALS) TABS tablet Take 1 tablet by mouth daily.    Historical Provider, MD  phenytoin (DILANTIN) 100 MG ER capsule Take 1 capsule (100 mg total) by mouth 3 (three) times daily. 08/07/15   Josalyn Funches, MD  potassium chloride SA (K-DUR,KLOR-CON) 20 MEQ tablet Take 1 tablet (  20 mEq total) by mouth daily. 08/23/15   John Molpus, MD   BP 125/83 mmHg  Pulse 108  Temp(Src) 98.5 F (36.9 C) (Oral)  Resp 16  Ht '5\' 8"'  (1.727 m)  Wt 250 lb (113.399 kg)  BMI 38.02 kg/m2  SpO2 100% Physical Exam  Constitutional: He is oriented to person, place, and time. He appears well-developed and well-nourished. No distress.  HENT:  Head: Normocephalic and atraumatic.  Eyes: EOM are normal. Pupils are equal, round, and reactive to light.  Cardiovascular: Normal rate.   Pulmonary/Chest: Effort normal.  Musculoskeletal:        Hands: Neurological: He is alert and oriented to person, place, and time.  Skin: Skin is warm and dry.  Psychiatric: He has a normal mood and affect. His behavior is normal.  Nursing note and vitals reviewed.   ED Course  Procedures (including critical care time) Labs Review Labs Reviewed - No data to display  Imaging Review No results found. I have personally reviewed and evaluated these images and lab results as part of my medical decision-making.  INCISION AND DRAINAGE Performed by: Blanchie Dessert Consent: Verbal consent obtained. Risks and benefits: risks, benefits and alternatives were discussed Type: abscess  Body area: left 3rd finger  Anesthesia:digital block  Incision was made with a scalpel.  Local anesthetic: lidocaine 2% without epinephrine  Anesthetic total: 3 ml  Complexity: complex   Drainage: purulent  Drainage amount: 17m  Packing material: none Patient tolerance: Patient tolerated the procedure well with no immediate complications.     MDM   Final diagnoses:  Paronychia of finger, left   patient with an uncomplicated paronychia of the left third digit that started today. He has a nail biter which is most likely the cause. Digital block performed an abscess drained with significant pus removed. Patient is continued to do warm soaks and return for any problems.    WBlanchie Dessert MD 08/29/15 [phone]

## 2015-09-10 ENCOUNTER — Emergency Department (HOSPITAL_BASED_OUTPATIENT_CLINIC_OR_DEPARTMENT_OTHER)
Admission: EM | Admit: 2015-09-10 | Discharge: 2015-09-10 | Disposition: A | Discharge status: Home / Self Care | Payer: Self-pay | Attending: Emergency Medicine | Admitting: Emergency Medicine

## 2015-09-10 ENCOUNTER — Encounter (HOSPITAL_BASED_OUTPATIENT_CLINIC_OR_DEPARTMENT_OTHER): Payer: Self-pay

## 2015-09-10 DIAGNOSIS — L03116 Cellulitis of left lower limb: Secondary | ICD-10-CM | POA: Insufficient documentation

## 2015-09-10 DIAGNOSIS — E119 Type 2 diabetes mellitus without complications: Secondary | ICD-10-CM | POA: Insufficient documentation

## 2015-09-10 DIAGNOSIS — Z79899 Other long term (current) drug therapy: Secondary | ICD-10-CM | POA: Insufficient documentation

## 2015-09-10 DIAGNOSIS — I10 Essential (primary) hypertension: Secondary | ICD-10-CM | POA: Insufficient documentation

## 2015-09-10 DIAGNOSIS — Z794 Long term (current) use of insulin: Secondary | ICD-10-CM | POA: Insufficient documentation

## 2015-09-10 DIAGNOSIS — G40909 Epilepsy, unspecified, not intractable, without status epilepticus: Secondary | ICD-10-CM | POA: Insufficient documentation

## 2015-09-10 MED ORDER — DOXYCYCLINE HYCLATE 100 MG PO CAPS: 100.0000 mg | ORAL_CAPSULE | Freq: Two times a day (BID) | ORAL | Status: DC

## 2015-09-10 NOTE — ED Provider Notes (Signed)
CSN: [id number]     Arrival date & time 09/10/15  0718 History   First MD Initiated Contact with Patient 09/10/15 0730     Chief Complaint  Patient presents with  . Leg Pain     (Consider location/radiation/quality/duration/timing/severity/associated sxs/prior Treatment) Patient is a 44 y.o. male presenting with leg pain. The history is provided by the patient.  Leg Pain Associated symptoms: no back pain and no fever    patient with the complaint of redness and swelling to the left leg. Patient not sure if he got bitten by anything. Noticed it mostly today but started yesterday. Patient is a diabetic. Patient has had trouble with infections in the past but not necessarily in this area.  Past Medical History  Diagnosis Date  . Diabetes mellitus   . Seizures ([place])   . Hypertension    History reviewed. No pertinent past surgical history. Family History  Problem Relation Age of Onset  . Hypertension Mother   . Diabetes Mother   . Lupus Mother   . Kidney failure Mother   . Diabetes Father   . Hypertension Father   . Kidney failure Father    Social History  Substance Use Topics  . Smoking status: Never Smoker   . Smokeless tobacco: None  . Alcohol Use: Yes    Review of Systems  Constitutional: Negative for fever.  HENT: Negative for congestion.   Eyes: Negative for visual disturbance.  Respiratory: Negative for shortness of breath.   Cardiovascular: Positive for leg swelling. Negative for chest pain.  Genitourinary: Negative for dysuria.  Musculoskeletal: Negative for back pain.  Skin: Negative for wound.  Neurological: Negative for headaches.  Hematological: Does not bruise/bleed easily.  Psychiatric/Behavioral: Negative for confusion.      Allergies  Review of patient's allergies indicates no known allergies.  Home Medications   Prior to Admission medications   Medication Sig Start Date End Date Taking? Authorizing Provider  acetaminophen (ARTHRITIS PAIN  RELIEF) 650 MG CR tablet Take 650 mg by mouth every 8 (eight) hours as needed for pain.   Yes Historical Provider, MD  Aspirin-Caffeine 500-32.5 MG TABS Take 1 tablet by mouth 2 (two) times daily.   Yes Historical Provider, MD  diazepam (VALIUM) 10 MG tablet Take 1 tablet nightly as needed for nocturnal leg cramps. 08/23/15  Yes John Molpus, MD  glimepiride (AMARYL) 4 MG tablet Take 4 mg by mouth daily with breakfast.   Yes Historical Provider, MD  glucose blood (ACCU-CHEK ACTIVE STRIPS) test strip Use as instructed 02/20/15  Yes Deepak Advani, MD  glucose monitoring kit (FREESTYLE) monitoring kit 1 each by Does not apply route as needed for other. 08/30/13  Yes Shanker Kristeen Mans, MD  insulin aspart (NOVOLOG) 100 UNIT/ML injection Inject 14 Units into the skin 3 (three) times daily before meals. Patient taking differently: Inject 15 Units into the skin 3 (three) times daily before meals.  03/19/15  Yes Lorayne Marek, MD  Insulin Glargine (LANTUS SOLOSTAR) 100 UNIT/ML Solostar Pen Inject 30 Units into the skin at bedtime. Patient taking differently: Inject 35 Units into the skin at bedtime.  05/24/15  Yes Deepak Advani, MD  metFORMIN (GLUCOPHAGE-XR) 500 MG 24 hr tablet TAKE 2 TABLETS BY MOUTH TWICE DAILY WITH A MEAL Patient taking differently: Take 1,000 mg by mouth 2 (two) times daily. TAKE 2 TABLETS BY MOUTH TWICE DAILY WITH A MEAL 02/20/15  Yes Lorayne Marek, MD  Multiple Vitamin (MULTIVITAMIN WITH MINERALS) TABS tablet Take 1 tablet by mouth  daily.   Yes Historical Provider, MD  phenytoin (DILANTIN) 100 MG ER capsule Take 1 capsule (100 mg total) by mouth 3 (three) times daily. 08/07/15  Yes Josalyn Funches, MD  potassium chloride SA (K-DUR,KLOR-CON) 20 MEQ tablet Take 1 tablet (20 mEq total) by mouth daily. 08/23/15  Yes John Molpus, MD  doxycycline (VIBRAMYCIN) 100 MG capsule Take 1 capsule (100 mg total) by mouth 2 (two) times daily. 09/10/15   Fredia Sorrow, MD   BP 122/105 mmHg  Pulse 109   Temp(Src) 98.4 F (36.9 C) (Oral)  Resp 20  Ht _0  (1.727 m)  Wt 250 lb (113.399 kg)  BMI 38.02 kg/m2  SpO2 97% Physical Exam  Constitutional: He is oriented to person, place, and time. He appears well-developed and well-nourished. No distress.  HENT:  Head: Normocephalic and atraumatic.  Mouth/Throat: Oropharynx is clear and moist.  Eyes: Conjunctivae and EOM are normal. Pupils are equal, round, and reactive to light.  Neck: Normal range of motion. Neck supple.  Cardiovascular: Normal rate, regular rhythm and normal heart sounds.   No murmur heard. Pulmonary/Chest: Effort normal. No respiratory distress.  Abdominal: Soft. Bowel sounds are normal. There is no tenderness.  Musculoskeletal: Normal range of motion. He exhibits edema and tenderness.  The left leg anterior shin with a 5 x 6 cm area of erythema about 4 x 4 centimeters area of induration no fluctuance. Associated with edema.  Neurological: He is alert and oriented to person, place, and time. No cranial nerve deficit. He exhibits normal muscle tone. Coordination normal.  Skin: Skin is warm.  Nursing note and vitals reviewed.   ED Course  Procedures (including critical care time) Labs Review Labs Reviewed - No data to display  Imaging Review No results found. I have personally reviewed and evaluated these images and lab results as part of my medical decision-making.   EKG Interpretation None      MDM   Final diagnoses:  Cellulitis of left lower extremity    Left leg with 5-6 cm area of erythema induration consistent with a cellulitis no evidence of any abscess cavity or fluctuance. Will treat with doxycycline. Patient will return if it does not respond to the antibiotic or if symptoms get worse. Patient nontoxic no acute distress.    Fredia Sorrow, MD 09/10/15 [phone]

## 2015-09-10 NOTE — Discharge Instructions (Signed)
Cellulitis Cellulitis is an infection of the skin and the tissue under the skin. The infected area is usually red and tender. This happens most often in the arms and lower legs. HOME CARE   Take your antibiotic medicine as told. Finish the medicine even if you start to feel better.  Keep the infected arm or leg raised (elevated).  Put a warm cloth on the area up to 4 times per day.  Only take medicines as told by your doctor.  Keep all doctor visits as told. GET HELP IF:  You see red streaks on the skin coming from the infected area.  Your red area gets bigger or turns a dark color.  Your bone or joint under the infected area is painful after the skin heals.  Your infection comes back in the same area or different area.  You have a puffy (swollen) bump in the infected area.  You have new symptoms.  You have a fever. GET HELP RIGHT AWAY IF:   You feel very sleepy.  You throw up (vomit) or have watery poop (diarrhea).  You feel sick and have muscle aches and pains.   This information is not intended to replace advice given to you by your health care provider. Make sure you discuss any questions you have with your health care provider.   Take antibiotic as directed. Return if not improving over the next few days or if symptoms get worse.   Document Released: 03/31/2008 Document Revised: 07/04/2015 Document Reviewed: 12/29/2011 Elsevier Interactive Patient Education [employer]2016 [employer] Inc.

## 2015-09-10 NOTE — ED Notes (Signed)
Complains of left leg pain with swelling.  Reports unknown if bitten by something. Has  Red swollen hot area noted to left leg. Leg is swollen from knee down.

## 2015-10-23 NOTE — Telephone Encounter (Signed)
Refill error

## 2015-10-31 ENCOUNTER — Other Ambulatory Visit: Payer: Self-pay

## 2015-10-31 ENCOUNTER — Other Ambulatory Visit: Payer: Self-pay | Admitting: Internal Medicine

## 2015-10-31 MED ORDER — INSULIN GLARGINE 100 UNIT/ML SOLOSTAR PEN: 30.0000 [IU] | PEN_INJECTOR | Freq: Every day | SUBCUTANEOUS | Status: DC

## 2015-10-31 MED ORDER — PHENYTOIN SODIUM EXTENDED 100 MG PO CAPS: 100.0000 mg | ORAL_CAPSULE | Freq: Three times a day (TID) | ORAL | Status: DC

## 2015-10-31 MED ORDER — INSULIN ASPART 100 UNIT/ML ~~LOC~~ SOLN: 14.0000 [IU] | Freq: Three times a day (TID) | SUBCUTANEOUS | Status: DC

## 2015-10-31 MED FILL — !LANTUS SOLOSTAR 100UNITS/M: 100 | 29 days supply | Qty: 9 | Fill #0

## 2015-10-31 MED FILL — PHENYTOIN SOD EXT 100 MG CA: 100 | 90 days supply | Qty: 90 | Fill #0

## 2015-10-31 MED FILL — !NOVOLOG 100UNITS/ML VIAL: 100/ML | 24 days supply | Qty: 10 | Fill #2

## 2015-11-06 ENCOUNTER — Other Ambulatory Visit: Payer: Self-pay | Admitting: Family Medicine

## 2015-11-06 ENCOUNTER — Other Ambulatory Visit: Payer: Self-pay | Admitting: *Deleted

## 2015-11-06 DIAGNOSIS — Z794 Long term (current) use of insulin: Principal | ICD-10-CM

## 2015-11-06 DIAGNOSIS — IMO0002 Reserved for concepts with insufficient information to code with codable children: Secondary | ICD-10-CM

## 2015-11-06 DIAGNOSIS — E118 Type 2 diabetes mellitus with unspecified complications: Principal | ICD-10-CM

## 2015-11-06 DIAGNOSIS — G40909 Epilepsy, unspecified, not intractable, without status epilepticus: Secondary | ICD-10-CM

## 2015-11-06 DIAGNOSIS — E1165 Type 2 diabetes mellitus with hyperglycemia: Secondary | ICD-10-CM

## 2015-11-06 MED ORDER — PHENYTOIN SODIUM EXTENDED 100 MG PO CAPS: 100.0000 mg | ORAL_CAPSULE | Freq: Three times a day (TID) | ORAL | Status: DC

## 2015-11-06 MED ORDER — INSULIN GLARGINE 100 UNIT/ML SOLOSTAR PEN: 30.0000 [IU] | PEN_INJECTOR | Freq: Every day | SUBCUTANEOUS | Status: DC

## 2015-11-06 MED ORDER — INSULIN ASPART 100 UNIT/ML ~~LOC~~ SOLN: 14.0000 [IU] | Freq: Three times a day (TID) | SUBCUTANEOUS | Status: DC

## 2015-11-13 ENCOUNTER — Encounter: Payer: Self-pay | Admitting: Family Medicine

## 2015-11-13 ENCOUNTER — Ambulatory Visit: Payer: Self-pay | Attending: Family Medicine | Admitting: Family Medicine

## 2015-11-13 VITALS — BP 120/76 | HR 94 | Temp 97.4°F | Resp 16 | Ht 68.0 in | Wt 243.0 lb

## 2015-11-13 DIAGNOSIS — E1129 Type 2 diabetes mellitus with other diabetic kidney complication: Secondary | ICD-10-CM

## 2015-11-13 DIAGNOSIS — N521 Erectile dysfunction due to diseases classified elsewhere: Secondary | ICD-10-CM

## 2015-11-13 DIAGNOSIS — Z794 Long term (current) use of insulin: Secondary | ICD-10-CM | POA: Insufficient documentation

## 2015-11-13 DIAGNOSIS — Z7982 Long term (current) use of aspirin: Secondary | ICD-10-CM | POA: Insufficient documentation

## 2015-11-13 DIAGNOSIS — E1165 Type 2 diabetes mellitus with hyperglycemia: Secondary | ICD-10-CM | POA: Insufficient documentation

## 2015-11-13 DIAGNOSIS — E118 Type 2 diabetes mellitus with unspecified complications: Secondary | ICD-10-CM

## 2015-11-13 DIAGNOSIS — R569 Unspecified convulsions: Secondary | ICD-10-CM | POA: Insufficient documentation

## 2015-11-13 DIAGNOSIS — E785 Hyperlipidemia, unspecified: Secondary | ICD-10-CM

## 2015-11-13 DIAGNOSIS — Z7984 Long term (current) use of oral hypoglycemic drugs: Secondary | ICD-10-CM | POA: Insufficient documentation

## 2015-11-13 DIAGNOSIS — G40909 Epilepsy, unspecified, not intractable, without status epilepticus: Secondary | ICD-10-CM

## 2015-11-13 DIAGNOSIS — R809 Proteinuria, unspecified: Secondary | ICD-10-CM

## 2015-11-13 DIAGNOSIS — I10 Essential (primary) hypertension: Secondary | ICD-10-CM

## 2015-11-13 DIAGNOSIS — R252 Cramp and spasm: Secondary | ICD-10-CM | POA: Insufficient documentation

## 2015-11-13 DIAGNOSIS — N529 Male erectile dysfunction, unspecified: Secondary | ICD-10-CM | POA: Insufficient documentation

## 2015-11-13 DIAGNOSIS — IMO0002 Reserved for concepts with insufficient information to code with codable children: Secondary | ICD-10-CM

## 2015-11-13 DIAGNOSIS — E1169 Type 2 diabetes mellitus with other specified complication: Secondary | ICD-10-CM

## 2015-11-13 LAB — COMPLETE METABOLIC PANEL WITH GFR
ALBUMIN: 3.9 g/dL (ref 3.6–5.1)
ALT: 37 U/L (ref 9–46)
AST: 20 U/L (ref 10–40)
Alkaline Phosphatase: 70 U/L (ref 40–115)
BILIRUBIN TOTAL: 0.5 mg/dL (ref 0.2–1.2)
BUN: 11 mg/dL (ref 7–25)
CO2: 27 mmol/L (ref 20–31)
CREATININE: 0.7 mg/dL (ref 0.60–1.35)
Calcium: 9 mg/dL (ref 8.6–10.3)
Chloride: 99 mmol/L (ref 98–110)
GFR, Est Non African American: >89 mL/min (ref >=60)
Glucose, Bld: 137 mg/dL — ABNORMAL HIGH (ref 65–99)
Potassium: 3.8 mmol/L (ref 3.5–5.3)
Sodium: 136 mmol/L (ref 135–146)
TOTAL PROTEIN: 6.9 g/dL (ref 6.1–8.1)

## 2015-11-13 LAB — LIPID PANEL
CHOLESTEROL: 211 mg/dL — AB (ref 125–200)
HDL: 33 mg/dL — ABNORMAL LOW (ref >=40)
LDL CALC: 99 mg/dL (ref <130)
TRIGLYCERIDES: 393 mg/dL — AB (ref <150)
Total CHOL/HDL Ratio: 6.4 Ratio — ABNORMAL HIGH (ref <=5.0)
VLDL: 79 mg/dL — ABNORMAL HIGH (ref <30)

## 2015-11-13 LAB — GLUCOSE, POCT (MANUAL RESULT ENTRY): POC Glucose: 177 mg/dl — AB (ref 70–99)

## 2015-11-13 LAB — POCT GLYCOSYLATED HEMOGLOBIN (HGB A1C): Hemoglobin A1C: 12.9

## 2015-11-13 MED ORDER — GLIMEPIRIDE 4 MG PO TABS: 4.0000 mg | ORAL_TABLET | Freq: Every day | ORAL | Status: DC

## 2015-11-13 MED ORDER — PHENYTOIN SODIUM EXTENDED 100 MG PO CAPS: 100.0000 mg | ORAL_CAPSULE | Freq: Three times a day (TID) | ORAL | Status: DC

## 2015-11-13 MED ORDER — INSULIN ASPART 100 UNIT/ML FLEXPEN: 15.0000 [IU] | PEN_INJECTOR | Freq: Three times a day (TID) | SUBCUTANEOUS | Status: DC

## 2015-11-13 MED ORDER — GLUCOSE BLOOD VI STRP: 1.0000 | ORAL_STRIP | Freq: Three times a day (TID) | Status: DC

## 2015-11-13 MED ORDER — TRUE METRIX METER W/DEVICE KIT: 1.0000 | PACK | Status: DC | PRN

## 2015-11-13 MED ORDER — CYCLOBENZAPRINE HCL 10 MG PO TABS: 10.0000 mg | ORAL_TABLET | Freq: Three times a day (TID) | ORAL | Status: DC | PRN

## 2015-11-13 MED ORDER — INSULIN GLARGINE 100 UNIT/ML SOLOSTAR PEN: 45.0000 [IU] | PEN_INJECTOR | Freq: Every day | SUBCUTANEOUS | Status: DC

## 2015-11-13 MED ORDER — METFORMIN HCL 1000 MG PO TABS: 1000.0000 mg | ORAL_TABLET | Freq: Two times a day (BID) | ORAL | Status: DC

## 2015-11-13 MED ORDER — TRUEPLUS LANCETS 28G MISC: 1.0000 | Freq: Three times a day (TID) | Status: DC

## 2015-11-13 MED ORDER — INSULIN PEN NEEDLE 31G X 8 MM MISC: 1.0000 "application " | Freq: Every day | Status: DC

## 2015-11-13 MED FILL — !TRUE METRIX BLOOD GLUCOSE: 365 days supply | Qty: 1 | Fill #0

## 2015-11-13 MED FILL — ?CYCLOBENZAPRINE 10 MG TABL: 10 | 10 days supply | Qty: 30 | Fill #0

## 2015-11-13 MED FILL — TRUEplus LANCETS 28G MISC: 33 days supply | Qty: 100 | Fill #0

## 2015-11-13 MED FILL — ?METFORMIN HCL 1,000 MG TAB: 1000 | 30 days supply | Qty: 60 | Fill #0

## 2015-11-13 MED FILL — ?GLIMEPIRIDE 4 MG TABLET: 4 | 30 days supply | Qty: 30 | Fill #0

## 2015-11-13 MED FILL — TRUE METRIX TEST STRIP: 33 days supply | Qty: 100 | Fill #0

## 2015-11-13 NOTE — Progress Notes (Signed)
Patient ID: Albert Garcia, male   DOB: [birth year], 45 y.o.   MRN: [id number]   Subjective:  Patient ID: Albert Garcia, male    DOB: August 02, 1971  Age: 45 y.o. MRN: [id number]  CC: Diabetes and Hospitalization Follow-up   HPI MESSIYAH WATERSON presents for    1. CHRONIC DIABETES  Disease Monitoring  Blood Sugar Ranges: not checking   Polyuria: no   Visual problems: no   Medication Compliance: compliant with insulin, not compliant with metformin or amaryl.  Medication Side Effects  Hypoglycemia:  no  Preventitive Health Care  Eye Exam: due   Foot Exam: done today   Diet pattern: not eating low carb   Exercise: minimal   2. ED f/u cellulitis in legs: he was seen in ED in 08/2015. He has no residual redness or swelling.   3. Leg cramping: in thighs and calves. Cramping with activity and with rest. No leg swelling.   4. Seizures: he continues to take dilantin. He remains seizure free. He has not followed up with neurology in a few years.    Social History  Substance Use Topics  . Smoking status: Never Smoker   . Smokeless tobacco: Not on file  . Alcohol Use: Yes    Outpatient Prescriptions Prior to Visit  Medication Sig Dispense Refill  . insulin aspart (NOVOLOG) 100 UNIT/ML injection Inject 14 Units into the skin 3 (three) times daily before meals. 40 mL 3  . Insulin Glargine (LANTUS SOLOSTAR) 100 UNIT/ML Solostar Pen Inject 30 Units into the skin at bedtime. 30 mL 3  . acetaminophen (ARTHRITIS PAIN RELIEF) 650 MG CR tablet Take 650 mg by mouth every 8 (eight) hours as needed for pain. Reported on 11/13/2015    . Aspirin-Caffeine 500-32.5 MG TABS Take 1 tablet by mouth 2 (two) times daily. Reported on 11/13/2015    . diazepam (VALIUM) 10 MG tablet Take 1 tablet nightly as needed for nocturnal leg cramps. (Patient not taking: Reported on 11/13/2015) 15 tablet 0  . doxycycline (VIBRAMYCIN) 100 MG capsule Take 1 capsule (100 mg total) by mouth 2 (two) times daily. (Patient not  taking: Reported on 11/13/2015) 14 capsule 0  . glimepiride (AMARYL) 4 MG tablet Take 4 mg by mouth daily with breakfast. Reported on 11/13/2015    . glucose blood (ACCU-CHEK ACTIVE STRIPS) test strip Use as instructed (Patient not taking: Reported on 11/13/2015) 100 each 12  . glucose monitoring kit (FREESTYLE) monitoring kit 1 each by Does not apply route as needed for other. (Patient not taking: Reported on 11/13/2015) 1 each 0  . metFORMIN (GLUCOPHAGE-XR) 500 MG 24 hr tablet TAKE 2 TABLETS BY MOUTH TWICE DAILY WITH A MEAL (Patient not taking: Reported on 11/13/2015) 180 tablet 7  . Multiple Vitamin (MULTIVITAMIN WITH MINERALS) TABS tablet Take 1 tablet by mouth daily. Reported on 11/13/2015    . phenytoin (DILANTIN) 100 MG ER capsule Take 1 capsule (100 mg total) by mouth 3 (three) times daily. (Patient not taking: Reported on 11/13/2015) 270 capsule 3  . potassium chloride SA (K-DUR,KLOR-CON) 20 MEQ tablet Take 1 tablet (20 mEq total) by mouth daily. (Patient not taking: Reported on 11/13/2015) 7 tablet 0   No facility-administered medications prior to visit.    ROS Review of Systems  Constitutional: Negative for fever, chills, fatigue and unexpected weight change.  Eyes: Negative for visual disturbance.  Respiratory: Negative for cough and shortness of breath.   Cardiovascular: Negative for chest pain, palpitations and leg swelling.  Gastrointestinal: Negative for nausea, vomiting, abdominal pain, diarrhea, constipation and blood in stool.  Endocrine: Negative for polydipsia, polyphagia and polyuria.  Genitourinary:       Erectile dysfunction   Musculoskeletal: Positive for myalgias. Negative for back pain, arthralgias, gait problem and neck pain.  Skin: Negative for rash.  Allergic/Immunologic: Negative for immunocompromised state.  Neurological: Negative for seizures.  Hematological: Negative for adenopathy. Does not bruise/bleed easily.  Psychiatric/Behavioral: Negative for suicidal  ideas, sleep disturbance and dysphoric mood. The patient is not nervous/anxious.     Objective:  BP 120/76 mmHg  Pulse 94  Temp(Src) 97.4 F (36.3 C) (Oral)  Resp 16  Ht '5\' 8"'  (1.727 m)  Wt 243 lb (110.224 kg)  BMI 36.96 kg/m2  SpO2 98%  BP/Weight 11/13/2015 09/10/2015 97/0/2637  Systolic BP 858 850 -  Diastolic BP 76 277 -  Wt. (Lbs) 243 250 -  BMI 36.96 38.02 38.02   Physical Exam  Constitutional: He appears well-developed and well-nourished. No distress.  HENT:  Head: Normocephalic and atraumatic.  Neck: Normal range of motion. Neck supple.  Cardiovascular: Normal rate, regular rhythm, normal heart sounds and intact distal pulses.   Pulmonary/Chest: Effort normal and breath sounds normal.  Musculoskeletal: He exhibits no edema.  Neurological: He is alert.  Skin: Skin is warm and dry. No rash noted. No erythema.  Psychiatric: He has a normal mood and affect.   Lab Results  Component Value Date   HGBA1C 12.90 11/13/2015   CBG 177  Assessment & Plan:   Problem List Items Addressed This Visit    Seizure disorder (HCC) (Chronic)   Relevant Medications   phenytoin (DILANTIN) 100 MG ER capsule   Other Relevant Orders   Phenytoin level, total   Ambulatory referral to Neurology   Type 2 diabetes mellitus, uncontrolled (HCC) - Primary (Chronic)   Relevant Medications   Insulin Glargine (LANTUS SOLOSTAR) 100 UNIT/ML Solostar Pen   glimepiride (AMARYL) 4 MG tablet   Blood Glucose Monitoring Suppl (TRUE METRIX METER) w/Device KIT   TRUEPLUS LANCETS 28G MISC   glucose blood (TRUE METRIX BLOOD GLUCOSE TEST) test strip   Insulin Pen Needle (B-D ULTRAFINE III SHORT PEN) 31G X 8 MM MISC   insulin aspart (NOVOLOG FLEXPEN) 100 UNIT/ML FlexPen   metFORMIN (GLUCOPHAGE) 1000 MG tablet   Other Relevant Orders   POCT glycosylated hemoglobin (Hb A1C) (Completed)   POCT glucose (manual entry) (Completed)   Microalbumin/Creatinine Ratio, Urine   Ambulatory referral to Ophthalmology    Lipid Panel   COMPLETE METABOLIC PANEL WITH GFR   Ambulatory referral to Ophthalmology    Other Visit Diagnoses    Muscle cramps        Relevant Medications    cyclobenzaprine (FLEXERIL) 10 MG tablet       No orders of the defined types were placed in this encounter.    Follow-up: No Follow-up on file.   Boykin Nearing MD

## 2015-11-13 NOTE — Assessment & Plan Note (Signed)
In setting of uncontrolled diabetes BMP Flexeril prn cramps  CBG control

## 2015-11-13 NOTE — Assessment & Plan Note (Addendum)
Normotensive w/o treatment Urine microalbumin Will monitor BP q office visit

## 2015-11-13 NOTE — Patient Instructions (Addendum)
Albert Garcia was seen today for diabetes and hospitalization follow-up.  Diagnoses and all orders for this visit:  Uncontrolled type 2 diabetes mellitus with complication, with long-term current use of insulin (HCC) -     POCT glycosylated hemoglobin (Hb A1C) -     POCT glucose (manual entry) -     Microalbumin/Creatinine Ratio, Urine -     Insulin Glargine (LANTUS SOLOSTAR) 100 UNIT/ML Solostar Pen; Inject 45 Units into the skin at bedtime. -     glimepiride (AMARYL) 4 MG tablet; Take 1 tablet (4 mg total) by mouth daily with breakfast. Reported on 11/13/2015 -     Ambulatory referral to Ophthalmology -     Blood Glucose Monitoring Suppl (TRUE METRIX METER) w/Device KIT; 1 each by Does not apply route as needed. -     TRUEPLUS LANCETS 28G MISC; 1 each by Does not apply route 3 (three) times daily. -     glucose blood (TRUE METRIX BLOOD GLUCOSE TEST) test strip; 1 each by Other route 3 (three) times daily. -     Insulin Pen Needle (B-D ULTRAFINE III SHORT PEN) 31G X 8 MM MISC; 1 application by Does not apply route daily. -     insulin aspart (NOVOLOG FLEXPEN) 100 UNIT/ML FlexPen; Inject 15 Units into the skin 3 (three) times daily with meals. -     Lipid Panel -     COMPLETE METABOLIC PANEL WITH GFR -     Ambulatory referral to Ophthalmology -     metFORMIN (GLUCOPHAGE) 1000 MG tablet; Take 1 tablet (1,000 mg total) by mouth 2 (two) times daily with a meal.  Seizure disorder (HCC) -     phenytoin (DILANTIN) 100 MG ER capsule; Take 1 capsule (100 mg total) by mouth 3 (three) times daily. -     Phenytoin level, total -     Ambulatory referral to Neurology  Muscle cramps -     cyclobenzaprine (FLEXERIL) 10 MG tablet; Take 1 tablet (10 mg total) by mouth 3 (three) times daily as needed for muscle spasms.   Look in to ketogenic diet- check out this website  Http://www.ruled.me/  Diabetes blood sugar goals  Fasting (in AM before breakfast, 8 hrs of no eating or drinking (except water or  unsweetened coffee or tea): 90-110 2 hrs after meals: < 160,   No low sugars: nothing < 70   F/u in 4 weeks with pharmacy for CBG check F/u with me in 8 weeks for diabetes and ED

## 2015-11-13 NOTE — Assessment & Plan Note (Signed)
Diabetes remains uncontrolled  We discussed insulin resistance We discussed that DM2 is contributing to ED and myalgias  meds per  Orders CBG goals provided  Labs per orders Opthalmology referral

## 2015-11-13 NOTE — Progress Notes (Signed)
F/U DM  C/C Back pain and leg cramping no Hx injury  No medication x 3-4 month  Pain scale #3 No tobacco user  No suicidal thought in the past two weeks

## 2015-11-14 LAB — MICROALBUMIN / CREATININE URINE RATIO
Creatinine, Urine: 83 mg/dL (ref 20–370)
MICROALB UR: 7.4 mg/dL
Microalb Creat Ratio: 89 mcg/mg creat — ABNORMAL HIGH (ref <30)

## 2015-11-14 LAB — PHENYTOIN LEVEL, TOTAL: PHENYTOIN LVL: 3.2 ug/mL — AB (ref 10.0–20.0)

## 2015-11-15 DIAGNOSIS — E785 Hyperlipidemia, unspecified: Secondary | ICD-10-CM

## 2015-11-15 DIAGNOSIS — E1169 Type 2 diabetes mellitus with other specified complication: Secondary | ICD-10-CM | POA: Insufficient documentation

## 2015-11-15 DIAGNOSIS — E1121 Type 2 diabetes mellitus with diabetic nephropathy: Secondary | ICD-10-CM | POA: Insufficient documentation

## 2015-11-15 MED ORDER — ATORVASTATIN CALCIUM 40 MG PO TABS: 40.0000 mg | ORAL_TABLET | Freq: Every day | ORAL | Status: DC

## 2015-11-15 MED ORDER — LOSARTAN POTASSIUM 25 MG PO TABS: 25.0000 mg | ORAL_TABLET | Freq: Every day | ORAL | Status: DC

## 2015-11-15 MED FILL — ATORVASTATIN 40 MG TABLET: 40 | 30 days supply | Qty: 30 | Fill #0

## 2015-11-15 MED FILL — LOSARTAN POTASSIUM 25 MG TA: 25 | 30 days supply | Qty: 30 | Fill #0

## 2015-11-15 NOTE — Addendum Note (Signed)
Addended by: Dessa Phi on: 11/15/2015 01:12 PM   Modules accepted: Orders

## 2015-11-21 ENCOUNTER — Ambulatory Visit: Payer: Self-pay | Attending: Family Medicine

## 2015-11-21 MED FILL — !NOVOLOG FLEXPEN SYRINGE 1: 100/ML | 33 days supply | Qty: 15 | Fill #0

## 2015-11-23 ENCOUNTER — Telehealth: Payer: Self-pay | Admitting: Family Medicine

## 2015-11-23 DIAGNOSIS — IMO0002 Reserved for concepts with insufficient information to code with codable children: Secondary | ICD-10-CM

## 2015-11-23 DIAGNOSIS — E1165 Type 2 diabetes mellitus with hyperglycemia: Secondary | ICD-10-CM

## 2015-11-23 DIAGNOSIS — E118 Type 2 diabetes mellitus with unspecified complications: Principal | ICD-10-CM

## 2015-11-23 DIAGNOSIS — Z794 Long term (current) use of insulin: Principal | ICD-10-CM

## 2015-11-23 MED ORDER — ASPIRIN 81 MG PO TABS
81.0000 mg | ORAL_TABLET | Freq: Every day | ORAL | Status: DC
Start: 2015-11-23 — End: 2015-12-27

## 2015-11-23 NOTE — Telephone Encounter (Signed)
Called patient Verified name and DOB Gave lab results and care plan He has already been notified of neurology appt He agrees with plan No questions  Will pick up new meds

## 2015-11-23 NOTE — Telephone Encounter (Signed)
-----   Message from Dessa Phi, MD sent at 11/15/2015  1:09 PM EST ----- Phenytoin level is low. Patient has been seizure free. Keep same dose. Neurology referral placed to determine if treatment still needed.  Normal CMP Elevated urine microalbumin, although BP was normal I recommend and have ordered losartan 25 mg daily to prevent diabetic kidney disease  High cholesterol in diabetic. lipitor 40 mg daily ordered

## 2015-12-04 ENCOUNTER — Encounter (HOSPITAL_BASED_OUTPATIENT_CLINIC_OR_DEPARTMENT_OTHER): Payer: Self-pay | Admitting: Adult Health

## 2015-12-04 DIAGNOSIS — Z79899 Other long term (current) drug therapy: Secondary | ICD-10-CM | POA: Insufficient documentation

## 2015-12-04 DIAGNOSIS — Z7984 Long term (current) use of oral hypoglycemic drugs: Secondary | ICD-10-CM | POA: Insufficient documentation

## 2015-12-04 DIAGNOSIS — M7501 Adhesive capsulitis of right shoulder: Secondary | ICD-10-CM | POA: Insufficient documentation

## 2015-12-04 DIAGNOSIS — Z7982 Long term (current) use of aspirin: Secondary | ICD-10-CM | POA: Insufficient documentation

## 2015-12-04 DIAGNOSIS — I10 Essential (primary) hypertension: Secondary | ICD-10-CM | POA: Insufficient documentation

## 2015-12-04 DIAGNOSIS — Z794 Long term (current) use of insulin: Secondary | ICD-10-CM | POA: Insufficient documentation

## 2015-12-04 DIAGNOSIS — R51 Headache: Secondary | ICD-10-CM | POA: Insufficient documentation

## 2015-12-04 DIAGNOSIS — E119 Type 2 diabetes mellitus without complications: Secondary | ICD-10-CM | POA: Insufficient documentation

## 2015-12-04 DIAGNOSIS — R531 Weakness: Secondary | ICD-10-CM | POA: Insufficient documentation

## 2015-12-04 NOTE — ED Notes (Signed)
Presents with right arm pain, worse with movement began at 6 pm tonight, denies injury, pain is worse with abduction and lifting, pt also states arm is weaker due to pain. C/o sinus pressure for one day and headache. Alert, oriented, MAEX4.

## 2015-12-05 ENCOUNTER — Emergency Department (HOSPITAL_BASED_OUTPATIENT_CLINIC_OR_DEPARTMENT_OTHER): Payer: Self-pay

## 2015-12-05 ENCOUNTER — Emergency Department (HOSPITAL_BASED_OUTPATIENT_CLINIC_OR_DEPARTMENT_OTHER)
Admission: EM | Admit: 2015-12-05 | Discharge: 2015-12-05 | Disposition: A | Discharge status: Home / Self Care | Payer: Self-pay | Attending: Emergency Medicine | Admitting: Emergency Medicine

## 2015-12-05 DIAGNOSIS — M25511 Pain in right shoulder: Secondary | ICD-10-CM

## 2015-12-05 DIAGNOSIS — M7501 Adhesive capsulitis of right shoulder: Secondary | ICD-10-CM

## 2015-12-05 MED ORDER — OXYCODONE-ACETAMINOPHEN 5-325 MG PO TABS: 1.0000 | ORAL_TABLET | ORAL | Status: DC | PRN

## 2015-12-05 MED ORDER — NAPROXEN 500 MG PO TABS: 500.0000 mg | ORAL_TABLET | Freq: Two times a day (BID) | ORAL | Status: DC

## 2015-12-05 NOTE — ED Provider Notes (Signed)
CSN: [id number]     Arrival date & time 12/04/15  2309 History   First MD Initiated Contact with Patient 12/05/15 [phone]     Chief Complaint  Patient presents with  . Arm Pain  . Facial Pain     (Consider location/radiation/quality/duration/timing/severity/associated sxs/prior Treatment) Patient is a 45 y.o. male presenting with arm pain. The history is provided by the patient.  Arm Pain  He took a nap and when he woke up, he had pain in his right arm and difficulty moving his arm. He denies numbness certainly. There is weakness related to pain. He denies any leg weakness. Pain is rated at 8/10. He took ibuprofen without relief. He denies any trauma and states he had no difficulty with his arm before he went to sleep.  Past Medical History  Diagnosis Date  . Diabetes mellitus   . Seizures ([place])   . Hypertension    History reviewed. No pertinent past surgical history. Family History  Problem Relation Age of Onset  . Hypertension Mother   . Diabetes Mother   . Lupus Mother   . Kidney failure Mother   . Diabetes Father   . Hypertension Father   . Kidney failure Father    Social History  Substance Use Topics  . Smoking status: Never Smoker   . Smokeless tobacco: None  . Alcohol Use: Yes    Review of Systems  All other systems reviewed and are negative.     Allergies  Review of patient's allergies indicates no known allergies.  Home Medications   Prior to Admission medications   Medication Sig Start Date End Date Taking? Authorizing Provider  acetaminophen (ARTHRITIS PAIN RELIEF) 650 MG CR tablet Take 650 mg by mouth every 8 (eight) hours as needed for pain. Reported on 11/13/2015    Historical Provider, MD  aspirin 81 MG tablet Take 1 tablet (81 mg total) by mouth daily. 11/23/15   Josalyn Funches, MD  Aspirin-Caffeine 500-32.5 MG TABS Take 1 tablet by mouth 2 (two) times daily. Reported on 11/13/2015    Historical Provider, MD  atorvastatin (LIPITOR) 40 MG tablet Take 1  tablet (40 mg total) by mouth daily. 11/15/15   Josalyn Funches, MD  Blood Glucose Monitoring Suppl (TRUE METRIX METER) w/Device KIT 1 each by Does not apply route as needed. 11/13/15   Josalyn Funches, MD  cyclobenzaprine (FLEXERIL) 10 MG tablet Take 1 tablet (10 mg total) by mouth 3 (three) times daily as needed for muscle spasms. 11/13/15   Josalyn Funches, MD  glimepiride (AMARYL) 4 MG tablet Take 1 tablet (4 mg total) by mouth daily with breakfast. Reported on 11/13/2015 11/13/15   Adriana Mccallum Funches, MD  glucose blood (TRUE METRIX BLOOD GLUCOSE TEST) test strip 1 each by Other route 3 (three) times daily. 11/13/15   Josalyn Funches, MD  insulin aspart (NOVOLOG FLEXPEN) 100 UNIT/ML FlexPen Inject 15 Units into the skin 3 (three) times daily with meals. 11/13/15   Josalyn Funches, MD  Insulin Glargine (LANTUS SOLOSTAR) 100 UNIT/ML Solostar Pen Inject 45 Units into the skin at bedtime. 11/13/15   Josalyn Funches, MD  Insulin Pen Needle (B-D ULTRAFINE III SHORT PEN) 31G X 8 MM MISC 1 application by Does not apply route daily. 11/13/15   Josalyn Funches, MD  losartan (COZAAR) 25 MG tablet Take 1 tablet (25 mg total) by mouth daily. 11/15/15   Josalyn Funches, MD  metFORMIN (GLUCOPHAGE) 1000 MG tablet Take 1 tablet (1,000 mg total) by mouth 2 (two) times daily  with a meal. 11/13/15   Boykin Nearing, MD  Multiple Vitamin (MULTIVITAMIN WITH MINERALS) TABS tablet Take 1 tablet by mouth daily. Reported on 11/13/2015    Historical Provider, MD  phenytoin (DILANTIN) 100 MG ER capsule Take 1 capsule (100 mg total) by mouth 3 (three) times daily. 11/13/15   Josalyn Funches, MD  TRUEPLUS LANCETS 28G MISC 1 each by Does not apply route 3 (three) times daily. 11/13/15   Josalyn Funches, MD   BP 126/81 mmHg  Pulse 108  Temp(Src) 98.4 F (36.9 C) (Oral)  Resp 20  Ht '5\' 8"'  (1.727 m)  Wt 245 lb (111.131 kg)  BMI 37.26 kg/m2  SpO2 99% Physical Exam  Nursing note and vitals reviewed.  45 year old male, resting comfortably  and in no acute distress. Vital signs are significant for mild tachycardia. Oxygen saturation is 99%, which is normal. Head is normocephalic and atraumatic. PERRLA, EOMI. Oropharynx is clear. Neck is nontender and supple without adenopathy or JVD. Back is nontender and there is no CVA tenderness. Lungs are clear without rales, wheezes, or rhonchi. Chest is nontender. Heart has regular rate and rhythm without murmur. Abdomen is soft, flat, nontender without masses or hepatosplenomegaly and peristalsis is normoactive. Extremities: There is tenderness to palpation in the soft tissues of the right shoulder without point tenderness. With virtually any range of motion. Neurovascular exam is intact with strong pulses, prompt capillary refill, normal sensation, and normal strength. Skin is warm and dry without rash. Neurologic: Mental status is normal, cranial nerves are intact, there are no motor or sensory deficits.  ED Course  Procedures (including critical care time)  Imaging Review Dg Shoulder Right  12/05/2015  CLINICAL DATA:  Diffuse right shoulder soreness, acute onset. Initial encounter. EXAM: RIGHT SHOULDER - 2+ VIEW COMPARISON:  None. FINDINGS: There is no evidence of fracture or dislocation. Degenerative change is noted about the glenoid, with osteophyte formation about the humeral head. Mild degenerative change is noted at the right acromioclavicular joint. No significant soft tissue abnormalities are seen. The visualized portions of the right lung are clear. IMPRESSION: 1. No evidence of fracture or dislocation. 2. Degenerative change about the glenoid, with osteophyte formation at the humeral head. Electronically Signed   By: Garald Balding M.D.   On: 12/05/2015 05:30   I have personally reviewed and evaluated these images as part of my medical decision-making.   MDM   Final diagnoses:  Pain in right shoulder  Adhesive capsulitis of shoulder, right   Right shoulder pain with the  clinical evidence of frozen shoulder. He is sent for x-ray for further evaluation. Old records reviewed and he has no relevant past visits.  X-rays shows some degenerative changes but no acute process. He is discharged with prescription for naproxen and oxycodone-acetaminophen and referred to orthopedics for follow-up.  Delora Fuel, MD 59/93/57 [id number]

## 2015-12-05 NOTE — Discharge Instructions (Signed)
Adhesive Capsulitis Adhesive capsulitis is inflammation of the tendons and ligaments that surround the shoulder joint (shoulder capsule). This condition causes the shoulder to become stiff and painful to move. Adhesive capsulitis is also called frozen shoulder. CAUSES This condition may be caused by:  An injury to the shoulder joint.  Straining the shoulder.  Not moving the shoulder for a period of time. This can happen if your arm was injured or in a sling.  Long-standing health problems, such as:  Diabetes.  Thyroid problems.  Heart disease.  Stroke.  Rheumatoid arthritis.  Lung disease. In some cases, the cause may not be known. RISK FACTORS This condition is more likely to develop in:  Women.  People who are older than 45 years of age. SYMPTOMS Symptoms of this condition include:  Pain in the shoulder when moving the arm. There may also be pain when parts of the shoulder are touched. The pain is worse at night or when at rest.  Soreness or aching in the shoulder.  Inability to move the shoulder normally.  Muscle spasms. DIAGNOSIS This condition is diagnosed with a physical exam and imaging tests, such as an X-ray or MRI. TREATMENT This condition may be treated with:  Treatment of the underlying cause or condition.  Physical therapy. This involves performing exercises to get the shoulder moving again.  Medicine. Medicine may be given to relieve pain, inflammation, or muscle spasms.  Steroid injections into the shoulder joint.  Shoulder manipulation. This is a procedure to move the shoulder into another position. It is done after you are given a medicine to make you fall asleep (general anesthetic). The joint may also be injected with salt water at high pressure to break down scarring.  Surgery. This may be done in severe cases when other treatments have failed. Although most people recover completely from adhesive capsulitis, some may not regain the full  movement of the shoulder. HOME CARE INSTRUCTIONS  Take over-the-counter and prescription medicines only as told by your health care provider.  If you are being treated with physical therapy, follow instructions from your physical therapist.  Avoid exercises that put a lot of demand on your shoulder, such as throwing. These exercises can make pain worse.  If directed, apply ice to the injured area:  Put ice in a plastic bag.  Place a towel between your skin and the bag.  Leave the ice on for 20 minutes, 2-3 times per day. SEEK MEDICAL CARE IF:  You develop new symptoms.  Your symptoms get worse.   This information is not intended to replace advice given to you by your health care provider. Make sure you discuss any questions you have with your health care provider.   Document Released: 08/10/2009 Document Revised: 07/04/2015 Document Reviewed: 02/05/2015 Elsevier Interactive Patient Education 2016 [employer]  Naproxen and naproxen sodium oral immediate-release tablets What is this medicine? NAPROXEN (na PROX en) is a non-steroidal anti-inflammatory drug (NSAID). It is used to reduce swelling and to treat pain. This medicine may be used for dental pain, headache, or painful monthly periods. It is also used for painful joint and muscular problems such as arthritis, tendinitis, bursitis, and gout. This medicine may be used for other purposes; ask your health care provider or pharmacist if you have questions. What should I tell my health care provider before I take this medicine? They need to know if you have any of these conditions: -asthma -cigarette smoker -drink more than 3 alcohol containing drinks a day -  heart disease or circulation problems such as heart failure or leg edema (fluid retention) -high blood pressure -kidney disease -liver disease -stomach bleeding or ulcers -an unusual or allergic reaction to naproxen, aspirin, other NSAIDs, other medicines, foods, dyes,  or preservatives -pregnant or trying to get pregnant -breast-feeding How should I use this medicine? Take this medicine by mouth with a glass of water. Follow the directions on the prescription label. Take it with food if your stomach gets upset. Try to not lie down for at least 10 minutes after you take it. Take your medicine at regular intervals. Do not take your medicine more often than directed. Long-term, continuous use may increase the risk of heart attack or stroke. A special MedGuide will be given to you by the pharmacist with each prescription and refill. Be sure to read this information carefully each time. Talk to your pediatrician regarding the use of this medicine in children. Special care may be needed. Overdosage: If you think you have taken too much of this medicine contact a poison control center or emergency room at once. NOTE: This medicine is only for you. Do not share this medicine with others. What if I miss a dose? If you miss a dose, take it as soon as you can. If it is almost time for your next dose, take only that dose. Do not take double or extra doses. What may interact with this medicine? -alcohol -aspirin -cidofovir -diuretics -lithium -methotrexate -other drugs for inflammation like ketorolac or prednisone -pemetrexed -probenecid -warfarin This list may not describe all possible interactions. Give your health care provider a list of all the medicines, herbs, non-prescription drugs, or dietary supplements you use. Also tell them if you smoke, drink alcohol, or use illegal drugs. Some items may interact with your medicine. What should I watch for while using this medicine? Tell your doctor or health care professional if your pain does not get better. Talk to your doctor before taking another medicine for pain. Do not treat yourself. This medicine does not prevent heart attack or stroke. In fact, this medicine may increase the chance of a heart attack or stroke.  The chance may increase with longer use of this medicine and in people who have heart disease. If you take aspirin to prevent heart attack or stroke, talk with your doctor or health care professional. Do not take other medicines that contain aspirin, ibuprofen, or naproxen with this medicine. Side effects such as stomach upset, nausea, or ulcers may be more likely to occur. Many medicines available without a prescription should not be taken with this medicine. This medicine can cause ulcers and bleeding in the stomach and intestines at any time during treatment. Do not smoke cigarettes or drink alcohol. These increase irritation to your stomach and can make it more susceptible to damage from this medicine. Ulcers and bleeding can happen without warning symptoms and can cause death. You may get drowsy or dizzy. Do not drive, use machinery, or do anything that needs mental alertness until you know how this medicine affects you. Do not stand or sit up quickly, especially if you are an older patient. This reduces the risk of dizzy or fainting spells. This medicine can cause you to bleed more easily. Try to avoid damage to your teeth and gums when you brush or floss your teeth. What side effects may I notice from receiving this medicine? Side effects that you should report to your doctor or health care professional as soon as possible: -black  or bloody stools, blood in the urine or vomit -blurred vision -chest pain -difficulty breathing or wheezing -nausea or vomiting -severe stomach pain -skin rash, skin redness, blistering or peeling skin, hives, or itching -slurred speech or weakness on one side of the body -swelling of eyelids, throat, lips -unexplained weight gain or swelling -unusually weak or tired -yellowing of eyes or skin Side effects that usually do not require medical attention (report to your doctor or health care professional if they continue or are  bothersome): -constipation -headache -heartburn This list may not describe all possible side effects. Call your doctor for medical advice about side effects. You may report side effects to FDA at 1-800-FDA-1088. Where should I keep my medicine? Keep out of the reach of children. Store at room temperature between 15 and 30 degrees C (59 and 86 degrees F). Keep container tightly closed. Throw away any unused medicine after the expiration date. NOTE: This sheet is a summary. It may not cover all possible information. If you have questions about this medicine, talk to your doctor, pharmacist, or health care provider.    2016, Elsevier/Gold Standard. (2009-10-15 20:10:16)  Acetaminophen; Oxycodone tablets What is this medicine? ACETAMINOPHEN; OXYCODONE (a set a MEE noe fen; ox i KOE done) is a pain reliever. It is used to treat moderate to severe pain. This medicine may be used for other purposes; ask your health care provider or pharmacist if you have questions. What should I tell my health care provider before I take this medicine? They need to know if you have any of these conditions: -brain tumor -Crohn's disease, inflammatory bowel disease, or ulcerative colitis -drug abuse or addiction -head injury -heart or circulation problems -if you often drink alcohol -kidney disease or problems going to the bathroom -liver disease -lung disease, asthma, or breathing problems -an unusual or allergic reaction to acetaminophen, oxycodone, other opioid analgesics, other medicines, foods, dyes, or preservatives -pregnant or trying to get pregnant -breast-feeding How should I use this medicine? Take this medicine by mouth with a full glass of water. Follow the directions on the prescription label. You can take it with or without food. If it upsets your stomach, take it with food. Take your medicine at regular intervals. Do not take it more often than directed. Talk to your pediatrician regarding the  use of this medicine in children. Special care may be needed. Patients over 5 years old may have a stronger reaction and need a smaller dose. Overdosage: If you think you have taken too much of this medicine contact a poison control center or emergency room at once. NOTE: This medicine is only for you. Do not share this medicine with others. What if I miss a dose? If you miss a dose, take it as soon as you can. If it is almost time for your next dose, take only that dose. Do not take double or extra doses. What may interact with this medicine? -alcohol -antihistamines -barbiturates like amobarbital, butalbital, butabarbital, methohexital, pentobarbital, phenobarbital, thiopental, and secobarbital -benztropine -drugs for bladder problems like solifenacin, trospium, oxybutynin, tolterodine, hyoscyamine, and methscopolamine -drugs for breathing problems like ipratropium and tiotropium -drugs for certain stomach or intestine problems like propantheline, homatropine methylbromide, glycopyrrolate, atropine, belladonna, and dicyclomine -general anesthetics like etomidate, ketamine, nitrous oxide, propofol, desflurane, enflurane, halothane, isoflurane, and sevoflurane -medicines for depression, anxiety, or psychotic disturbances -medicines for sleep -muscle relaxants -naltrexone -narcotic medicines (opiates) for pain -phenothiazines like perphenazine, thioridazine, chlorpromazine, mesoridazine, fluphenazine, prochlorperazine, promazine, and trifluoperazine -scopolamine -tramadol -trihexyphenidyl  This list may not describe all possible interactions. Give your health care provider a list of all the medicines, herbs, non-prescription drugs, or dietary supplements you use. Also tell them if you smoke, drink alcohol, or use illegal drugs. Some items may interact with your medicine. What should I watch for while using this medicine? Tell your doctor or health care professional if your pain does not go  away, if it gets worse, or if you have new or a different type of pain. You may develop tolerance to the medicine. Tolerance means that you will need a higher dose of the medication for pain relief. Tolerance is normal and is expected if you take this medicine for a long time. Do not suddenly stop taking your medicine because you may develop a severe reaction. Your body becomes used to the medicine. This does NOT mean you are addicted. Addiction is a behavior related to getting and using a drug for a non-medical reason. If you have pain, you have a medical reason to take pain medicine. Your doctor will tell you how much medicine to take. If your doctor wants you to stop the medicine, the dose will be slowly lowered over time to avoid any side effects. You may get drowsy or dizzy. Do not drive, use machinery, or do anything that needs mental alertness until you know how this medicine affects you. Do not stand or sit up quickly, especially if you are an older patient. This reduces the risk of dizzy or fainting spells. Alcohol may interfere with the effect of this medicine. Avoid alcoholic drinks. There are different types of narcotic medicines (opiates) for pain. If you take more than one type at the same time, you may have more side effects. Give your health care provider a list of all medicines you use. Your doctor will tell you how much medicine to take. Do not take more medicine than directed. Call emergency for help if you have problems breathing. The medicine will cause constipation. Try to have a bowel movement at least every 2 to 3 days. If you do not have a bowel movement for 3 days, call your doctor or health care professional. Do not take Tylenol (acetaminophen) or medicines that have acetaminophen with this medicine. Too much acetaminophen can be very dangerous. Many nonprescription medicines contain acetaminophen. Always read the labels carefully to avoid taking more acetaminophen. What side effects  may I notice from receiving this medicine? Side effects that you should report to your doctor or health care professional as soon as possible: -allergic reactions like skin rash, itching or hives, swelling of the face, lips, or tongue -breathing difficulties, wheezing -confusion -light headedness or fainting spells -severe stomach pain -unusually weak or tired -yellowing of the skin or the whites of the eyes Side effects that usually do not require medical attention (report to your doctor or health care professional if they continue or are bothersome): -dizziness -drowsiness -nausea -vomiting This list may not describe all possible side effects. Call your doctor for medical advice about side effects. You may report side effects to FDA at 1-800-FDA-1088. Where should I keep my medicine? Keep out of the reach of children. This medicine can be abused. Keep your medicine in a safe place to protect it from theft. Do not share this medicine with anyone. Selling or giving away this medicine is dangerous and against the law. This medicine may cause accidental overdose and death if it taken by other adults, children, or pets. Mix any unused medicine  with a substance like cat litter or coffee grounds. Then throw the medicine away in a sealed container like a sealed bag or a coffee can with a lid. Do not use the medicine after the expiration date. Store at room temperature between 20 and 25 degrees C (68 and 77 degrees F). NOTE: This sheet is a summary. It may not cover all possible information. If you have questions about this medicine, talk to your doctor, pharmacist, or health care provider.    2016, [employer]vier/[employer] Standard. (2014-09-13 15:18:46)

## 2015-12-19 MED FILL — LOSARTAN POTASSIUM 25 MG TA: 25 | 30 days supply | Qty: 30 | Fill #1

## 2015-12-19 MED FILL — GLIMEPIRIDE 4 MG TABLET: 4 | 30 days supply | Qty: 30 | Fill #1

## 2015-12-19 MED FILL — metFORMIN HCL 1000 MG TABS: 1000 | 30 days supply | Qty: 60 | Fill #1

## 2015-12-19 MED FILL — ATORVASTATIN 40 MG TABLET: 40 | 30 days supply | Qty: 30 | Fill #1

## 2015-12-27 ENCOUNTER — Ambulatory Visit (INDEPENDENT_AMBULATORY_CARE_PROVIDER_SITE_OTHER): Payer: Self-pay | Admitting: Neurology

## 2015-12-27 ENCOUNTER — Encounter: Payer: Self-pay | Admitting: Neurology

## 2015-12-27 VITALS — BP 150/100 | HR 91 | Ht 68.0 in | Wt 256.0 lb

## 2015-12-27 DIAGNOSIS — Z79899 Other long term (current) drug therapy: Secondary | ICD-10-CM

## 2015-12-27 DIAGNOSIS — G40309 Generalized idiopathic epilepsy and epileptic syndromes, not intractable, without status epilepticus: Secondary | ICD-10-CM | POA: Insufficient documentation

## 2015-12-27 DIAGNOSIS — G40909 Epilepsy, unspecified, not intractable, without status epilepticus: Secondary | ICD-10-CM

## 2015-12-27 MED ORDER — PHENYTOIN SODIUM EXTENDED 100 MG PO CAPS: ORAL_CAPSULE | ORAL | Status: DC
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
## 2015-12-27 NOTE — Patient Instructions (Signed)
1. Start taking Dilantin : 3 capsules once a day 2. After a week of taking medication once a day, have bloodwork for Dilantin level and vitamin D level 3. Follow-up in 1 year, call for any changes  Seizure Precautions: 1. If medication has been prescribed for you to prevent seizures, take it exactly as directed.  Do not stop taking the medicine without talking to your doctor first, even if you have not had a seizure in a long time.   2. Avoid activities in which a seizure would cause danger to yourself or to others.  Don't operate dangerous machinery, swim alone, or climb in high or dangerous places, such as on ladders, roofs, or girders.  Do not drive unless your doctor says you may.  3. If you have any warning that you may have a seizure, lay down in a safe place where you can't hurt yourself.    4.  No driving for 6 months from last seizure, as per [facility].   Please refer to the following link on the [employer]ile[employer] of [employer]'s website for more information: http://www.epilepsyfoundation.org/answerplace/Social/driving/drivingu.cfm   5.  Maintain good sleep hygiene. Avoid alcohol  6.  Contact your doctor if you have any problems that may be related to the medicine you are taking.  7.  Call 911 and bring the patient back to the ED if:        A.  The seizure lasts longer than 5 minutes.       B.  The patient doesn't awaken shortly after the seizure  C.  The patient has new problems such as difficulty seeing, speaking or moving  D.  The patient was injured during the seizure  E.  The patient has a temperature over 102 F (39C)  F.  The patient vomited and now is having trouble breathing

## 2015-12-31 ENCOUNTER — Encounter (HOSPITAL_BASED_OUTPATIENT_CLINIC_OR_DEPARTMENT_OTHER): Payer: Self-pay

## 2015-12-31 ENCOUNTER — Emergency Department (HOSPITAL_BASED_OUTPATIENT_CLINIC_OR_DEPARTMENT_OTHER)
Admission: EM | Admit: 2015-12-31 | Discharge: 2015-12-31 | Disposition: A | Discharge status: Home / Self Care | Payer: Self-pay | Attending: Emergency Medicine | Admitting: Emergency Medicine

## 2015-12-31 DIAGNOSIS — Z7982 Long term (current) use of aspirin: Secondary | ICD-10-CM | POA: Insufficient documentation

## 2015-12-31 DIAGNOSIS — Z794 Long term (current) use of insulin: Secondary | ICD-10-CM | POA: Insufficient documentation

## 2015-12-31 DIAGNOSIS — E119 Type 2 diabetes mellitus without complications: Secondary | ICD-10-CM | POA: Insufficient documentation

## 2015-12-31 DIAGNOSIS — Z7984 Long term (current) use of oral hypoglycemic drugs: Secondary | ICD-10-CM | POA: Insufficient documentation

## 2015-12-31 DIAGNOSIS — Z79899 Other long term (current) drug therapy: Secondary | ICD-10-CM | POA: Insufficient documentation

## 2015-12-31 DIAGNOSIS — I10 Essential (primary) hypertension: Secondary | ICD-10-CM | POA: Insufficient documentation

## 2015-12-31 DIAGNOSIS — M791 Myalgia: Secondary | ICD-10-CM | POA: Insufficient documentation

## 2015-12-31 DIAGNOSIS — Z791 Long term (current) use of non-steroidal anti-inflammatories (NSAID): Secondary | ICD-10-CM | POA: Insufficient documentation

## 2015-12-31 HISTORY — DX: Headache, unspecified: R51.9

## 2015-12-31 HISTORY — DX: Headache: R51

## 2015-12-31 LAB — CBC WITH DIFFERENTIAL/PLATELET
BASOS PCT: 1 %
Basophils Absolute: 0 10*3/uL (ref 0.0–0.1)
Eosinophils Absolute: 0.2 10*3/uL (ref 0.0–0.7)
Eosinophils Relative: 4 %
HEMATOCRIT: 40.1 % (ref 39.0–52.0)
HEMOGLOBIN: 14.1 g/dL (ref 13.0–17.0)
LYMPHS PCT: 53 %
Lymphs Abs: 2.3 10*3/uL (ref 0.7–4.0)
MCH: 32.3 pg (ref 26.0–34.0)
MCHC: 35.2 g/dL (ref 30.0–36.0)
MCV: 91.8 fL (ref 78.0–100.0)
MONOS PCT: 11 %
Monocytes Absolute: 0.5 10*3/uL (ref 0.1–1.0)
NEUTROS ABS: 1.4 10*3/uL — AB (ref 1.7–7.7)
NEUTROS PCT: 31 %
Platelets: 249 10*3/uL (ref 150–400)
RBC: 4.37 MIL/uL (ref 4.22–5.81)
RDW: 12.1 % (ref 11.5–15.5)
WBC: 4.3 10*3/uL (ref 4.0–10.5)

## 2015-12-31 LAB — BASIC METABOLIC PANEL
Anion gap: 8 (ref 5–15)
BUN: 11 mg/dL (ref 6–20)
CALCIUM: 8.9 mg/dL (ref 8.9–10.3)
CHLORIDE: 102 mmol/L (ref 101–111)
CO2: 28 mmol/L (ref 22–32)
CREATININE: 0.64 mg/dL (ref 0.61–1.24)
GLUCOSE: 220 mg/dL — AB (ref 65–99)
Potassium: 3.8 mmol/L (ref 3.5–5.1)
Sodium: 138 mmol/L (ref 135–145)

## 2015-12-31 LAB — CBG MONITORING, ED: Glucose-Capillary: 222 mg/dL — ABNORMAL HIGH (ref 65–99)

## 2015-12-31 MED ORDER — HYDROCHLOROTHIAZIDE 25 MG PO TABS: 25.0000 mg | ORAL_TABLET | Freq: Every day | ORAL | Status: DC

## 2015-12-31 NOTE — ED Notes (Signed)
Pt states he has had H/A and body aches x 2 days. Worried about his blood sugar as well. Has not checked it in two days, but has been taking meds.

## 2015-12-31 NOTE — ED Provider Notes (Signed)
CSN: [id number]     Arrival date & time 12/31/15  1756 History  By signing my name below, I, Albert Garcia, attest that this documentation has been prepared under the direction and in the presence of Albert Nayobert Reighlyn Elmes, MD. Electronically Signed: Budd PalmerVanessa Garcia, ED Scribe. 12/31/2015. 9:27 PM.      Chief Complaint  Patient presents with  . Headache   The history is provided by the patient. No language interpreter was used.   HPI Comments: Albert Garcia is a 45 y.o. male with a PMHx of headaches, HTN, DM, and seizures who presents to the Emergency Department complaining of headache onset 2 days ago. He reports associated generalized myalgias. Pt states he is worried the headache may be due to HTN or high blood sugars. He is unsure as to whether he was taken off of his blood pressure medication and which ones he is currently taking. He notes he gave his medications to his girlfriend, who will be bringing them to the ED. Pt denies fever.   Past Medical History  Diagnosis Date  . Diabetes mellitus   . Seizures (HCC)   . Hypertension   . Headache    Past Surgical History  Procedure Laterality Date  . No past surgeries     Family History  Problem Relation Age of Onset  . Hypertension Mother   . Diabetes Mother   . Lupus Mother   . Kidney failure Mother   . Diabetes Father   . Hypertension Father   . Kidney failure Father    Social History  Substance Use Topics  . Smoking status: Never Smoker   . Smokeless tobacco: Never Used  . Alcohol Use: No     Comment: Occasional    Review of Systems  Constitutional: Negative for fever.  Musculoskeletal: Positive for myalgias.  Neurological: Positive for headaches.  All other systems reviewed and are negative.   Allergies  Review of patient's allergies indicates no known allergies.  Home Medications   Prior to Admission medications   Medication Sig Start Date End Date Taking? Authorizing Provider  acetaminophen (ARTHRITIS PAIN RELIEF)  650 MG CR tablet Take 650 mg by mouth every 8 (eight) hours as needed for pain. Reported on 11/13/2015    Historical Provider, MD  aspirin 325 MG EC tablet Take 325 mg by mouth daily.    Historical Provider, MD  atorvastatin (LIPITOR) 40 MG tablet Take 1 tablet (40 mg total) by mouth daily. 11/15/15   Albert Funches, MD  cyclobenzaprine (FLEXERIL) 10 MG tablet Take 1 tablet (10 mg total) by mouth 3 (three) times daily as needed for muscle spasms. 11/13/15   Albert Funches, MD  glimepiride (AMARYL) 4 MG tablet Take 1 tablet (4 mg total) by mouth daily with breakfast. Reported on 11/13/2015 11/13/15   Albert PhiJosalyn Funches, MD  insulin aspart (NOVOLOG FLEXPEN) 100 UNIT/ML FlexPen Inject 15 Units into the skin 3 (three) times daily with meals. Patient taking differently: 14 Units 3 (three) times daily with meals.  11/13/15   Albert Funches, MD  Insulin Glargine (LANTUS SOLOSTAR) 100 UNIT/ML Solostar Pen Inject 45 Units into the skin at bedtime. Patient taking differently: Inject 35 Units into the skin at bedtime.  11/13/15   Albert Funches, MD  losartan (COZAAR) 25 MG tablet Take 1 tablet (25 mg total) by mouth daily. 11/15/15   Albert PhiJosalyn Funches, MD  metFORMIN (GLUCOPHAGE) 1000 MG tablet Take 1 tablet (1,000 mg total) by mouth 2 (two) times daily with a meal. 11/13/15  Albert Phi, MD  Multiple Vitamin (MULTIVITAMIN WITH MINERALS) TABS tablet Take 1 tablet by mouth daily. Reported on 11/13/2015    Historical Provider, MD  naproxen (NAPROSYN) 500 MG tablet Take 1 tablet (500 mg total) by mouth 2 (two) times daily. 12/05/15   Albert Booze, MD  oxyCODONE-acetaminophen (PERCOCET) 5-325 MG tablet Take 1 tablet by mouth every 4 (four) hours as needed for moderate pain. 12/05/15   Albert Booze, MD  phenytoin (DILANTIN) 100 MG ER capsule Take 3 capsules once a day 12/27/15   Albert Clines, MD   BP 144/98 mmHg  Pulse 90  Temp(Src) 98.7 F (37.1 C) (Oral)  Resp 18  Ht  (1.727 m)  Wt 250 lb (113.399 kg)  BMI 38.02  kg/m2  SpO2 98% Physical Exam  ED Course  Procedures  DIAGNOSTIC STUDIES: Oxygen Saturation is 97% on RA, adequate by my interpretation.    COORDINATION OF CARE: 8:43 PM - Discussed lab results and plans to wait for pt's girlfriend to bring his BP medications to determine which ones he is taking. Pt advised of plan for treatment and pt agrees.  9:22 PM - Checked back with pt who now has his medications with him. He is currently on Losartan. Discussed elevated BP today and plans to order further diagnostic studies. Pt advised of plan for treatment and pt agrees.   Labs Review Labs Reviewed  CBC WITH DIFFERENTIAL/PLATELET - Abnormal; Notable for the following:    Neutro Abs 1.4 (*)    All other components within normal limits  BASIC METABOLIC PANEL - Abnormal; Notable for the following:    Glucose, Bld 220 (*)    All other components within normal limits  CBG MONITORING, ED - Abnormal; Notable for the following:    Glucose-Capillary 222 (*)    All other components within normal limits        MDM   Final diagnoses:  Essential hypertension      Albert Nay, MD 12/31/15 2213

## 2015-12-31 NOTE — Discharge Instructions (Signed)
DASH Eating Plan °DASH stands for "Dietary Approaches to Stop Hypertension." The DASH eating plan is a healthy eating plan that has been shown to reduce high blood pressure (hypertension). Additional health benefits may include reducing the risk of type 2 diabetes mellitus, heart disease, and stroke. The DASH eating plan may also help with weight loss. °WHAT DO I NEED TO KNOW ABOUT THE DASH EATING PLAN? °For the DASH eating plan, you will follow these general guidelines: °· Choose foods with a percent daily value for sodium of less than 5% (as listed on the food label). °· Use salt-free seasonings or herbs instead of table salt or sea salt. °· Check with your health care provider or pharmacist before using salt substitutes. °· Eat lower-sodium products, often labeled as "lower sodium" or "no salt added." °· Eat fresh foods. °· Eat more vegetables, fruits, and low-fat dairy products. °· Choose whole grains. Look for the word "whole" as the first word in the ingredient list. °· Choose fish and skinless chicken or turkey more often than red meat. Limit fish, poultry, and meat to 6 oz (170 g) each day. °· Limit sweets, desserts, sugars, and sugary drinks. °· Choose heart-healthy fats. °· Limit cheese to 1 oz (28 g) per day. °· Eat more home-cooked food and less restaurant, buffet, and fast food. °· Limit fried foods. °· Cook foods using methods other than frying. °· Limit canned vegetables. If you do use them, rinse them well to decrease the sodium. °· When eating at a restaurant, ask that your food be prepared with less salt, or no salt if possible. °WHAT FOODS CAN I EAT? °Seek help from a dietitian for individual calorie needs. °Grains °Whole grain or whole wheat bread. Brown rice. Whole grain or whole wheat pasta. Quinoa, bulgur, and whole grain cereals. Low-sodium cereals. Corn or whole wheat flour tortillas. Whole grain cornbread. Whole grain crackers. Low-sodium crackers. °Vegetables °Fresh or frozen vegetables  (raw, steamed, roasted, or grilled). Low-sodium or reduced-sodium tomato and vegetable juices. Low-sodium or reduced-sodium tomato sauce and paste. Low-sodium or reduced-sodium canned vegetables.  °Fruits °All fresh, canned (in natural juice), or frozen fruits. °Meat and Other Protein Products °Ground beef (85% or leaner), grass-fed beef, or beef trimmed of fat. Skinless chicken or turkey. Ground chicken or turkey. Pork trimmed of fat. All fish and seafood. Eggs. Dried beans, peas, or lentils. Unsalted nuts and seeds. Unsalted canned beans. °Dairy °Low-fat dairy products, such as skim or 1% milk, 2% or reduced-fat cheeses, low-fat ricotta or cottage cheese, or plain low-fat yogurt. Low-sodium or reduced-sodium cheeses. °Fats and Oils °Tub margarines without trans fats. Light or reduced-fat mayonnaise and salad dressings (reduced sodium). Avocado. Safflower, olive, or canola oils. Natural peanut or almond butter. °Other °Unsalted popcorn and pretzels. °The items listed above may not be a complete list of recommended foods or beverages. Contact your dietitian for more options. °WHAT FOODS ARE NOT RECOMMENDED? °Grains °White bread. White pasta. White rice. Refined cornbread. Bagels and croissants. Crackers that contain trans fat. °Vegetables °Creamed or fried vegetables. Vegetables in a cheese sauce. Regular canned vegetables. Regular canned tomato sauce and paste. Regular tomato and vegetable juices. °Fruits °Dried fruits. Canned fruit in light or heavy syrup. Fruit juice. °Meat and Other Protein Products °Fatty cuts of meat. Ribs, chicken wings, bacon, sausage, bologna, salami, chitterlings, fatback, hot dogs, bratwurst, and packaged luncheon meats. Salted nuts and seeds. Canned beans with salt. °Dairy °Whole or 2% milk, cream, half-and-half, and cream cheese. Whole-fat or sweetened yogurt. Full-fat   cheeses or blue cheese. Nondairy creamers and whipped toppings. Processed cheese, cheese spreads, or cheese  curds. °Condiments °Onion and garlic salt, seasoned salt, table salt, and sea salt. Canned and packaged gravies. Worcestershire sauce. Tartar sauce. Barbecue sauce. Teriyaki sauce. Soy sauce, including reduced sodium. Steak sauce. Fish sauce. Oyster sauce. Cocktail sauce. Horseradish. Ketchup and mustard. Meat flavorings and tenderizers. Bouillon cubes. Hot sauce. Tabasco sauce. Marinades. Taco seasonings. Relishes. °Fats and Oils °Butter, stick margarine, lard, shortening, ghee, and bacon fat. Coconut, palm kernel, or palm oils. Regular salad dressings. °Other °Pickles and olives. Salted popcorn and pretzels. °The items listed above may not be a complete list of foods and beverages to avoid. Contact your dietitian for more information. °WHERE CAN I FIND MORE INFORMATION? °[employer]: www.nhlbi.nih.gov/health/health-topics/topics/dash/ °  °This information is not intended to replace advice given to you by your health care provider. Make sure you discuss any questions you have with your health care provider. °  °Document Released: 10/02/2011 Document Revised: 11/03/2014 Document Reviewed: 08/17/2013 °Elsevier Interactive Patient Education ©2016 [employer] ° °Hypertension °Hypertension, commonly called high blood pressure, is when the force of blood pumping through your arteries is too strong. Your arteries are the blood vessels that carry blood from your heart throughout your body. A blood pressure reading consists of a higher number over a lower number, such as 110/72. The higher number (systolic) is the pressure inside your arteries when your heart pumps. The lower number (diastolic) is the pressure inside your arteries when your heart relaxes. Ideally you want your blood pressure below 120/80. °Hypertension forces your heart to work harder to pump blood. Your arteries may become narrow or stiff. Having untreated or uncontrolled hypertension can cause heart attack, stroke, kidney  disease, and other problems. °RISK FACTORS °Some risk factors for high blood pressure are controllable. Others are not.  °Risk factors you cannot control include:  °· Race. You may be at higher risk if you are African American. °· Age. Risk increases with age. °· Gender. Men are at higher risk than women before age 45 years. After age 65, women are at higher risk than men. °Risk factors you can control include: °· Not getting enough exercise or physical activity. °· Being overweight. °· Getting too much fat, sugar, calories, or salt in your diet. °· Drinking too much alcohol. °SIGNS AND SYMPTOMS °Hypertension does not usually cause signs or symptoms. Extremely high blood pressure (hypertensive crisis) may cause headache, anxiety, shortness of breath, and nosebleed. °DIAGNOSIS °To check if you have hypertension, your health care provider will measure your blood pressure while you are seated, with your arm held at the level of your heart. It should be measured at least twice using the same arm. Certain conditions can cause a difference in blood pressure between your right and left arms. A blood pressure reading that is higher than normal on one occasion does not mean that you need treatment. If it is not clear whether you have high blood pressure, you may be asked to return on a different day to have your blood pressure checked again. Or, you may be asked to monitor your blood pressure at home for 1 or more weeks. °TREATMENT °Treating high blood pressure includes making lifestyle changes and possibly taking medicine. Living a healthy lifestyle can help lower high blood pressure. You may need to change some of your habits. °Lifestyle changes may include: °· Following the DASH diet. This diet is high in fruits, vegetables, and whole   grains. It is low in salt, red meat, and added sugars. °· Keep your sodium intake below 2,300 mg per day. °· Getting at least 30-45 minutes of aerobic exercise at least 4 times per  week. °· Losing weight if necessary. °· Not smoking. °· Limiting alcoholic beverages. °· Learning ways to reduce stress. °Your health care provider may prescribe medicine if lifestyle changes are not enough to get your blood pressure under control, and if one of the following is true: °· You are 18-59 years of age and your systolic blood pressure is above 140. °· You are 60 years of age or older, and your systolic blood pressure is above 150. °· Your diastolic blood pressure is above 90. °· You have diabetes, and your systolic blood pressure is over 140 or your diastolic blood pressure is over 90. °· You have kidney disease and your blood pressure is above 140/90. °· You have heart disease and your blood pressure is above 140/90. °Your personal target blood pressure may vary depending on your medical conditions, your age, and other factors. °HOME CARE INSTRUCTIONS °· Have your blood pressure rechecked as directed by your health care provider.   °· Take medicines only as directed by your health care provider. Follow the directions carefully. Blood pressure medicines must be taken as prescribed. The medicine does not work as well when you skip doses. Skipping doses also puts you at risk for problems. °· Do not smoke.   °· Monitor your blood pressure at home as directed by your health care provider.  °SEEK MEDICAL CARE IF:  °· You think you are having a reaction to medicines taken. °· You have recurrent headaches or feel dizzy. °· You have swelling in your ankles. °· You have trouble with your vision. °SEEK IMMEDIATE MEDICAL CARE IF: °· You develop a severe headache or confusion. °· You have unusual weakness, numbness, or feel faint. °· You have severe chest or abdominal pain. °· You vomit repeatedly. °· You have trouble breathing. °MAKE SURE YOU:  °· Understand these instructions. °· Will watch your condition. °· Will get help right away if you are not doing well or get worse. °  °This information is not intended to  replace advice given to you by your health care provider. Make sure you discuss any questions you have with your health care provider. °  °Document Released: 10/13/2005 Document Revised: 02/27/2015 Document Reviewed: 08/05/2013 °Elsevier Interactive Patient Education ©2016 [employer] ° °

## 2015-12-31 NOTE — ED Notes (Signed)
Pt reports intermittent ha, hx of same.

## 2016-01-04 ENCOUNTER — Other Ambulatory Visit (INDEPENDENT_AMBULATORY_CARE_PROVIDER_SITE_OTHER): Payer: Self-pay

## 2016-01-04 DIAGNOSIS — Z79899 Other long term (current) drug therapy: Secondary | ICD-10-CM

## 2016-01-04 DIAGNOSIS — G40309 Generalized idiopathic epilepsy and epileptic syndromes, not intractable, without status epilepticus: Secondary | ICD-10-CM

## 2016-01-04 DIAGNOSIS — G40909 Epilepsy, unspecified, not intractable, without status epilepticus: Secondary | ICD-10-CM

## 2016-01-04 LAB — VITAMIN D 25 HYDROXY (VIT D DEFICIENCY, FRACTURES): VITD: 19.12 ng/mL — ABNORMAL LOW (ref 30.00–100.00)

## 2016-01-05 LAB — PHENYTOIN LEVEL, TOTAL: PHENYTOIN LVL: 4.1 ug/mL — AB (ref 10.0–20.0)

## 2016-01-11 ENCOUNTER — Telehealth: Payer: Self-pay | Admitting: Family Medicine

## 2016-01-11 MED ORDER — VITAMIN D (ERGOCALCIFEROL) 1.25 MG (50000 UNIT) PO CAPS: ORAL_CAPSULE | ORAL | Status: DC

## 2016-01-11 NOTE — Telephone Encounter (Signed)
Patient notified of results & advisement. Will send Rx for Vitamin D to pharmacy for patient.

## 2016-01-11 NOTE — Telephone Encounter (Signed)
-----   Message from Van ClinesKaren M Aquino, MD sent at 01/11/2016  3:54 PM EDT ----- Pls let him know that his Dilantin level is still low, but if no seizures, would continue on same dose and make sure he does not miss any medications. His vitamin D level is low, start vitamin D 50,000 IU every week for 8 weeks, then after start a daily calcium with vitamin D supplement. Thanks

## 2016-01-16 MED FILL — ATORVASTATIN 40 MG TABLET: 40 | 30 days supply | Qty: 30 | Fill #2

## 2016-01-16 MED FILL — metFORMIN HCL 1000 MG TABS: 1000 | 30 days supply | Qty: 60 | Fill #2

## 2016-01-16 MED FILL — LOSARTAN POTASSIUM 25 MG TA: 25 | 30 days supply | Qty: 30 | Fill #2

## 2016-01-16 MED FILL — GLIMEPIRIDE 4 MG TABLET: 4 | 30 days supply | Qty: 30 | Fill #2

## 2016-01-17 MED FILL — !NOVOLOG 100UNITS/ML VIAL: 100/ML | 90 days supply | Qty: 40 | Fill #0

## 2016-01-17 MED FILL — $LANTUS 100 UNITS/ML VIAL: 100 | 30 days supply | Qty: 10 | Fill #0

## 2016-01-21 ENCOUNTER — Encounter: Payer: Self-pay | Admitting: Family Medicine

## 2016-01-21 ENCOUNTER — Ambulatory Visit: Payer: Self-pay | Attending: Family Medicine | Admitting: Family Medicine

## 2016-01-21 VITALS — BP 144/80 | HR 96 | Temp 98.1°F | Resp 18 | Ht 68.0 in | Wt 258.0 lb

## 2016-01-21 DIAGNOSIS — G40909 Epilepsy, unspecified, not intractable, without status epilepticus: Secondary | ICD-10-CM

## 2016-01-21 DIAGNOSIS — R809 Proteinuria, unspecified: Secondary | ICD-10-CM

## 2016-01-21 DIAGNOSIS — E785 Hyperlipidemia, unspecified: Secondary | ICD-10-CM

## 2016-01-21 DIAGNOSIS — E1165 Type 2 diabetes mellitus with hyperglycemia: Secondary | ICD-10-CM | POA: Insufficient documentation

## 2016-01-21 DIAGNOSIS — E1129 Type 2 diabetes mellitus with other diabetic kidney complication: Secondary | ICD-10-CM

## 2016-01-21 DIAGNOSIS — E669 Obesity, unspecified: Secondary | ICD-10-CM | POA: Insufficient documentation

## 2016-01-21 DIAGNOSIS — N521 Erectile dysfunction due to diseases classified elsewhere: Secondary | ICD-10-CM

## 2016-01-21 DIAGNOSIS — I10 Essential (primary) hypertension: Secondary | ICD-10-CM | POA: Insufficient documentation

## 2016-01-21 DIAGNOSIS — Z6839 Body mass index (BMI) 39.0-39.9, adult: Secondary | ICD-10-CM | POA: Insufficient documentation

## 2016-01-21 DIAGNOSIS — E1169 Type 2 diabetes mellitus with other specified complication: Secondary | ICD-10-CM

## 2016-01-21 DIAGNOSIS — Z7982 Long term (current) use of aspirin: Secondary | ICD-10-CM | POA: Insufficient documentation

## 2016-01-21 DIAGNOSIS — N529 Male erectile dysfunction, unspecified: Secondary | ICD-10-CM | POA: Insufficient documentation

## 2016-01-21 DIAGNOSIS — E118 Type 2 diabetes mellitus with unspecified complications: Secondary | ICD-10-CM

## 2016-01-21 DIAGNOSIS — Z794 Long term (current) use of insulin: Secondary | ICD-10-CM | POA: Insufficient documentation

## 2016-01-21 DIAGNOSIS — Z79899 Other long term (current) drug therapy: Secondary | ICD-10-CM | POA: Insufficient documentation

## 2016-01-21 DIAGNOSIS — IMO0002 Reserved for concepts with insufficient information to code with codable children: Secondary | ICD-10-CM

## 2016-01-21 LAB — GLUCOSE, POCT (MANUAL RESULT ENTRY): POC Glucose: 115 mg/dl — AB (ref 70–99)

## 2016-01-21 MED ORDER — TADALAFIL 20 MG PO TABS: 10.0000 mg | ORAL_TABLET | ORAL | Status: DC | PRN

## 2016-01-21 MED ORDER — ATORVASTATIN CALCIUM 40 MG PO TABS: 40.0000 mg | ORAL_TABLET | Freq: Every day | ORAL | Status: DC

## 2016-01-21 MED ORDER — LOSARTAN POTASSIUM 100 MG PO TABS: 100.0000 mg | ORAL_TABLET | Freq: Every day | ORAL | Status: DC

## 2016-01-21 MED ORDER — PHENYTOIN SODIUM EXTENDED 100 MG PO CAPS: 300.0000 mg | ORAL_CAPSULE | Freq: Every day | ORAL | Status: DC

## 2016-01-21 MED ORDER — EXENATIDE 5 MCG/0.02ML ~~LOC~~ SOPN: 5.0000 ug | PEN_INJECTOR | Freq: Two times a day (BID) | SUBCUTANEOUS | Status: DC

## 2016-01-21 MED ORDER — HYDROCHLOROTHIAZIDE 25 MG PO TABS: 25.0000 mg | ORAL_TABLET | Freq: Every day | ORAL | Status: DC

## 2016-01-21 MED FILL — PHENYTOIN SOD EXT 100 MG CA: 100 | 30 days supply | Qty: 90 | Fill #0

## 2016-01-21 MED FILL — !BYETTA 5 MCG DOSE PEN INJ: 5 | 15 days supply | Qty: 1 | Fill #0

## 2016-01-21 NOTE — Progress Notes (Signed)
Patient is here for Physical  Patient is requesting refills on HCTZ, Dilatin, Atorvastatin Patient denies pain at this time.  Patient has taken his medications and patient has eaten this morining.

## 2016-01-21 NOTE — Patient Instructions (Addendum)
Albert Garcia was seen today for annual exam.  Diagnoses and all orders for this visit:  Uncontrolled type 2 diabetes mellitus with complication, with long-term current use of insulin (HCC) -     Glucose (CBG) -     atorvastatin (LIPITOR) 40 MG tablet; Take 1 tablet (40 mg total) by mouth daily. -     exenatide (BYETTA 5 MCG PEN) 5 MCG/0.02ML SOPN injection; Inject 0.02 mLs (5 mcg total) into the skin 2 (two) times daily with a meal.  Hyperlipidemia associated with type 2 diabetes mellitus (HCC) -     atorvastatin (LIPITOR) 40 MG tablet; Take 1 tablet (40 mg total) by mouth daily.  Seizure disorder (HCC) -     phenytoin (DILANTIN) 100 MG ER capsule; Take 3 capsules (300 mg total) by mouth daily. Take 3 capsules once a day  Essential hypertension -     Discontinue: hydrochlorothiazide (HYDRODIURIL) 25 MG tablet; Take 1 tablet (25 mg total) by mouth daily. -     losartan (COZAAR) 100 MG tablet; Take 1 tablet (100 mg total) by mouth daily.  Type 2 diabetes mellitus with microalbuminuria, with long-term current use of insulin (HCC) -     losartan (COZAAR) 100 MG tablet; Take 1 tablet (100 mg total) by mouth daily.  Erectile dysfunction due to diseases classified elsewhere -     tadalafil (CIALIS) 20 MG tablet; Take 0.5-1 tablets (10-20 mg total) by mouth every other day as needed for erectile dysfunction.   Type 2 diabetes Weight loss Low sugar diet Careful use of insulin   STOP novolog  Add byetta 5 mcg twice daily with meals  Continue lantus   Diabetes blood sugar goals  Fasting (in AM before breakfast, 8 hrs of no eating or drinking (except water or unsweetened coffee or tea): 90-110 2 hrs after meals: < 160,   No low sugars: nothing < 70    F/u in 6 weeks for diabetes   Dr. Armen PickupFunches

## 2016-01-21 NOTE — Assessment & Plan Note (Signed)
ED with diabetes and HTN Add cialis

## 2016-01-21 NOTE — Assessment & Plan Note (Signed)
A: uncontrolled diabetes with truncal obesity consistent with insulin resistance  P:  Stop novolog Add byetta Low sugar diet-discussed low carb fruits and veggies Exercise

## 2016-01-21 NOTE — Progress Notes (Signed)
SUBJECTIVE:  Albert Garcia is a 45 y.o. male presenting for his annual checkup. He has diabetes, HTN, ED, obesity. He is working to lose weight.  He endorses tingling and burning in his feet.  He had an ED visit for HA and was found to be HTN. HCTZ 25 mg daily ordered.   Social History  Substance Use Topics  . Smoking status: Never Smoker   . Smokeless tobacco: Never Used  . Alcohol Use: No     Comment: Occasional    Current Outpatient Prescriptions  Medication Sig Dispense Refill  . acetaminophen (ARTHRITIS PAIN RELIEF) 650 MG CR tablet Take 650 mg by mouth every 8 (eight) hours as needed for pain. Reported on 11/13/2015    . aspirin 325 MG EC tablet Take 325 mg by mouth daily.    Marland Kitchen. atorvastatin (LIPITOR) 40 MG tablet Take 1 tablet (40 mg total) by mouth daily. 30 tablet 5  . cyclobenzaprine (FLEXERIL) 10 MG tablet Take 1 tablet (10 mg total) by mouth 3 (three) times daily as needed for muscle spasms. 30 tablet 0  . glimepiride (AMARYL) 4 MG tablet Take 1 tablet (4 mg total) by mouth daily with breakfast. Reported on 11/13/2015 30 tablet 5  . hydrochlorothiazide (HYDRODIURIL) 25 MG tablet Take 1 tablet (25 mg total) by mouth daily. 30 tablet 0  . insulin aspart (NOVOLOG FLEXPEN) 100 UNIT/ML FlexPen Inject 15 Units into the skin 3 (three) times daily with meals. (Patient taking differently: 14 Units 3 (three) times daily with meals. ) 15 mL 5  . Insulin Glargine (LANTUS SOLOSTAR) 100 UNIT/ML Solostar Pen Inject 45 Units into the skin at bedtime. (Patient taking differently: Inject 35 Units into the skin at bedtime. ) 15 mL 5  . losartan (COZAAR) 25 MG tablet Take 1 tablet (25 mg total) by mouth daily. 30 tablet 5  . metFORMIN (GLUCOPHAGE) 1000 MG tablet Take 1 tablet (1,000 mg total) by mouth 2 (two) times daily with a meal. 60 tablet 5  . Multiple Vitamin (MULTIVITAMIN WITH MINERALS) TABS tablet Take 1 tablet by mouth daily. Reported on 11/13/2015    . naproxen (NAPROSYN) 500 MG tablet Take  1 tablet (500 mg total) by mouth 2 (two) times daily. 30 tablet 0  . phenytoin (DILANTIN) 100 MG ER capsule Take 3 capsules once a day 90 capsule 5  . Vitamin D, Ergocalciferol, (DRISDOL) 50000 units CAPS capsule Take 1 capsule weekly for 8 weeks. 8 capsule 0  . oxyCODONE-acetaminophen (PERCOCET) 5-325 MG tablet Take 1 tablet by mouth every 4 (four) hours as needed for moderate pain. (Patient not taking: Reported on 01/21/2016) 10 tablet 0  . [DISCONTINUED] gemfibrozil (LOPID) 600 MG tablet Take 1 tablet (600 mg total) by mouth 2 (two) times daily before a meal. (Patient not taking: Reported on 06/17/2015) 60 tablet 2  . [DISCONTINUED] quinapril-hydrochlorothiazide (ACCURETIC) 20-12.5 MG per tablet Take 1 tablet by mouth daily. (Patient not taking: Reported on 06/17/2015) 90 tablet 7   No current facility-administered medications for this visit.   Allergies: Review of patient's allergies indicates no known allergies.   ROS:  Feeling well. No dyspnea or chest pain on exertion. No abdominal pain, change in bowel habits, black or bloody stools. No urinary tract or prostatic symptoms. No headache, leg swelling. Has tingling and burning in feet. Has trouble getting and maintaining erection.   OBJECTIVE:  The patient appears well, alert, oriented x 3, in no distress.  BP 144/80 mmHg  Pulse 96  Temp(Src) 98.1  F (36.7 C) (Oral)  Resp 18  Ht  (1.727 m)  Wt 258 lb (117.028 kg)  BMI 39.24 kg/m2  SpO2 100% ENT normal.  Neck supple. No adenopathy or thyromegaly. PERLA. Lungs are clear, good air entry, no wheezes, rhonchi or rales. S1 and S2 normal, no murmurs, regular rate and rhythm. Abdomen is soft without tenderness, guarding, mass or organomegaly. GU exam: no penile lesions or discharge, no testicular masses or tenderness, no hernias.  Rectal: normal prostate. Firm stool in rectal vault is heme negative.  Extremities show no edema, normal peripheral pulses. Neurological is normal without focal  findings.  Lab Results  Component Value Date   HGBA1C 12.90 11/13/2015   CBG 115  ASSESSMENT:  healthy adult male  PLAN:  begin progressive daily aerobic exercise program

## 2016-01-21 NOTE — Assessment & Plan Note (Signed)
A: HTN with labile BPs. ED. P: D.c HCTZ Increase losartan to 100 mg daily

## 2016-01-29 MED FILL — !CIALIS 20 MG TABLET: 20 | 30 days supply | Qty: 5 | Fill #0

## 2016-02-15 MED FILL — PHENYTOIN SOD EXT 100 MG CA: 100 | 30 days supply | Qty: 90 | Fill #1

## 2016-02-15 MED FILL — ATORVASTATIN 40 MG TABLET: 40 | 30 days supply | Qty: 30 | Fill #3

## 2016-02-15 MED FILL — GLIMEPIRIDE 4 MG TABLET: 4 | 30 days supply | Qty: 30 | Fill #3

## 2016-02-15 MED FILL — $LANTUS SOLOSTAR 100 UNITS/: 100 | 29 days supply | Qty: 9 | Fill #1

## 2016-02-15 MED FILL — LOSARTAN POTASSIUM 25 MG TA: 25 | 30 days supply | Qty: 30 | Fill #3

## 2016-02-20 ENCOUNTER — Other Ambulatory Visit: Payer: Self-pay | Admitting: *Deleted

## 2016-02-20 DIAGNOSIS — E1165 Type 2 diabetes mellitus with hyperglycemia: Secondary | ICD-10-CM

## 2016-02-20 DIAGNOSIS — E118 Type 2 diabetes mellitus with unspecified complications: Principal | ICD-10-CM

## 2016-02-20 DIAGNOSIS — Z794 Long term (current) use of insulin: Principal | ICD-10-CM

## 2016-02-20 DIAGNOSIS — IMO0002 Reserved for concepts with insufficient information to code with codable children: Secondary | ICD-10-CM

## 2016-02-20 DIAGNOSIS — N521 Erectile dysfunction due to diseases classified elsewhere: Secondary | ICD-10-CM

## 2016-02-20 MED ORDER — TADALAFIL 20 MG PO TABS: 10.0000 mg | ORAL_TABLET | ORAL | Status: DC | PRN

## 2016-02-20 MED ORDER — EXENATIDE 5 MCG/0.02ML ~~LOC~~ SOPN
5.0000 ug | PEN_INJECTOR | Freq: Two times a day (BID) | SUBCUTANEOUS | Status: DC
Start: 2016-02-20 — End: 2016-11-28

## 2016-04-21 MED FILL — ATORVASTATIN 40 MG TABLET: 40 | 30 days supply | Qty: 30 | Fill #4

## 2016-04-21 MED FILL — metFORMIN HCL 1000 MG TABS: 1000 | 30 days supply | Qty: 60 | Fill #3

## 2016-04-21 MED FILL — $LANTUS SOLOSTAR 100 UNITS/: 100 | 29 days supply | Qty: 9 | Fill #2

## 2016-04-21 MED FILL — BYETTA 5 MCG DOSE PEN INJ: 5 | 30 days supply | Qty: 1 | Fill #1

## 2016-04-21 MED FILL — PHENYTOIN SOD EXT 100 MG CA: 100 | 30 days supply | Qty: 90 | Fill #2

## 2016-04-22 MED FILL — GLIMEPIRIDE 4 MG TABLET: 4 | 30 days supply | Qty: 30 | Fill #4

## 2016-05-02 ENCOUNTER — Other Ambulatory Visit: Payer: Self-pay

## 2016-05-02 DIAGNOSIS — G40909 Epilepsy, unspecified, not intractable, without status epilepticus: Secondary | ICD-10-CM

## 2016-05-02 MED ORDER — PHENYTOIN SODIUM EXTENDED 100 MG PO CAPS: 300.0000 mg | ORAL_CAPSULE | Freq: Every day | ORAL | Status: DC

## 2016-05-05 ENCOUNTER — Ambulatory Visit: Payer: Self-pay | Admitting: Family Medicine

## 2016-06-16 MED FILL — ?METFORMIN HCL 1,000 MG TAB: 1000 | 30 days supply | Qty: 60 | Fill #4

## 2016-06-16 MED FILL — $LANTUS SOLOSTAR 100 UNITS/: 100 | 29 days supply | Qty: 9 | Fill #0

## 2016-06-16 MED FILL — ?ATORVASTATIN 40MG TABLET: 40 | 30 days supply | Qty: 30 | Fill #5

## 2016-06-16 MED FILL — ?GLIMEPIRIDE 4 MG TABLET: 4 | 30 days supply | Qty: 30 | Fill #5

## 2016-06-16 MED FILL — ?PHENYTOIN SOD EXT 100 MG C: 100 | 30 days supply | Qty: 90 | Fill #3

## 2016-06-16 MED FILL — LOSARTAN POTASSIUM 25 MG TA: 25 | 30 days supply | Qty: 30 | Fill #4

## 2016-07-03 ENCOUNTER — Ambulatory Visit: Payer: Self-pay | Admitting: Family Medicine

## 2016-07-14 ENCOUNTER — Ambulatory Visit: Payer: Self-pay | Admitting: Family Medicine

## 2016-08-12 ENCOUNTER — Other Ambulatory Visit: Payer: Self-pay | Admitting: Family Medicine

## 2016-08-12 DIAGNOSIS — E118 Type 2 diabetes mellitus with unspecified complications: Principal | ICD-10-CM

## 2016-08-12 DIAGNOSIS — Z794 Long term (current) use of insulin: Principal | ICD-10-CM

## 2016-08-12 DIAGNOSIS — IMO0002 Reserved for concepts with insufficient information to code with codable children: Secondary | ICD-10-CM

## 2016-08-12 DIAGNOSIS — E1165 Type 2 diabetes mellitus with hyperglycemia: Secondary | ICD-10-CM

## 2016-08-12 MED FILL — metFORMIN HCL 1000 MG TABS: 1000 | 30 days supply | Qty: 60 | Fill #5

## 2016-08-12 MED FILL — LOSARTAN POTASSIUM 25 MG TA: 25 | 30 days supply | Qty: 30 | Fill #5

## 2016-08-12 MED FILL — ATORVASTATIN 40 MG TABLET: 40 | 30 days supply | Qty: 30 | Fill #0

## 2016-08-12 MED FILL — $LANTUS SOLOSTAR 100 UNITS/: 100 | 29 days supply | Qty: 9 | Fill #1

## 2016-08-12 MED FILL — ?PHENYTOIN SOD EXT 100 MG C: 100 | 30 days supply | Qty: 90 | Fill #4

## 2016-08-13 MED FILL — GLIMEPIRIDE 4 MG TABLET: 4 | 30 days supply | Qty: 30 | Fill #0

## 2016-09-02 ENCOUNTER — Emergency Department (HOSPITAL_BASED_OUTPATIENT_CLINIC_OR_DEPARTMENT_OTHER)
Admission: EM | Admit: 2016-09-02 | Discharge: 2016-09-02 | Disposition: A | Discharge status: Home / Self Care | Payer: Self-pay | Attending: Emergency Medicine | Admitting: Emergency Medicine

## 2016-09-02 ENCOUNTER — Encounter (HOSPITAL_BASED_OUTPATIENT_CLINIC_OR_DEPARTMENT_OTHER): Payer: Self-pay | Admitting: *Deleted

## 2016-09-02 DIAGNOSIS — Z79899 Other long term (current) drug therapy: Secondary | ICD-10-CM | POA: Insufficient documentation

## 2016-09-02 DIAGNOSIS — E119 Type 2 diabetes mellitus without complications: Secondary | ICD-10-CM | POA: Insufficient documentation

## 2016-09-02 DIAGNOSIS — M10041 Idiopathic gout, right hand: Secondary | ICD-10-CM | POA: Insufficient documentation

## 2016-09-02 DIAGNOSIS — Z7982 Long term (current) use of aspirin: Secondary | ICD-10-CM | POA: Insufficient documentation

## 2016-09-02 DIAGNOSIS — M109 Gout, unspecified: Secondary | ICD-10-CM

## 2016-09-02 DIAGNOSIS — Z794 Long term (current) use of insulin: Secondary | ICD-10-CM | POA: Insufficient documentation

## 2016-09-02 DIAGNOSIS — I10 Essential (primary) hypertension: Secondary | ICD-10-CM | POA: Insufficient documentation

## 2016-09-02 HISTORY — DX: Male erectile dysfunction, unspecified: N52.9

## 2016-09-02 LAB — CBC WITH DIFFERENTIAL/PLATELET
Basophils Absolute: 0 10*3/uL (ref 0.0–0.1)
Basophils Relative: 0 %
EOS PCT: 2 %
Eosinophils Absolute: 0.1 10*3/uL (ref 0.0–0.7)
HCT: 40 % (ref 39.0–52.0)
Hemoglobin: 14.2 g/dL (ref 13.0–17.0)
LYMPHS ABS: 1.7 10*3/uL (ref 0.7–4.0)
LYMPHS PCT: 33 %
MCH: 32.3 pg (ref 26.0–34.0)
MCHC: 35.5 g/dL (ref 30.0–36.0)
MCV: 91.1 fL (ref 78.0–100.0)
MONO ABS: 0.6 10*3/uL (ref 0.1–1.0)
MONOS PCT: 12 %
Neutro Abs: 2.7 10*3/uL (ref 1.7–7.7)
Neutrophils Relative %: 53 %
PLATELETS: 241 10*3/uL (ref 150–400)
RBC: 4.39 MIL/uL (ref 4.22–5.81)
RDW: 12.2 % (ref 11.5–15.5)
WBC: 5.2 10*3/uL (ref 4.0–10.5)

## 2016-09-02 LAB — BASIC METABOLIC PANEL
Anion gap: 8 (ref 5–15)
BUN: 16 mg/dL (ref 6–20)
CALCIUM: 9.4 mg/dL (ref 8.9–10.3)
CO2: 28 mmol/L (ref 22–32)
Chloride: 99 mmol/L — ABNORMAL LOW (ref 101–111)
Creatinine, Ser: 0.9 mg/dL (ref 0.61–1.24)
GFR calc Af Amer: >60 mL/min (ref >60)
GLUCOSE: 451 mg/dL — AB (ref 65–99)
Potassium: 3.9 mmol/L (ref 3.5–5.1)
Sodium: 135 mmol/L (ref 135–145)

## 2016-09-02 LAB — CK: Total CK: 224 U/L (ref 49–397)

## 2016-09-02 LAB — SEDIMENTATION RATE: Sed Rate: 25 mm/hr — ABNORMAL HIGH (ref 0–16)

## 2016-09-02 LAB — C-REACTIVE PROTEIN: CRP: 2.3 mg/dL — ABNORMAL HIGH (ref <1.0)

## 2016-09-02 LAB — URIC ACID: URIC ACID, SERUM: 4 mg/dL — AB (ref 4.4–7.6)

## 2016-09-02 MED ORDER — METFORMIN HCL 500 MG PO TABS
1000.0000 mg | ORAL_TABLET | Freq: Two times a day (BID) | ORAL | Status: DC
  Administered 2016-09-02: 1000 mg via ORAL
  Filled 2016-09-02: qty 2

## 2016-09-02 MED ORDER — GLIMEPIRIDE 4 MG PO TABS
4.0000 mg | ORAL_TABLET | Freq: Every day | ORAL | Status: DC
  Filled 2016-09-02: qty 1

## 2016-09-02 MED ORDER — EXENATIDE 5 MCG/0.02ML ~~LOC~~ SOPN
5.0000 ug | PEN_INJECTOR | Freq: Two times a day (BID) | SUBCUTANEOUS | Status: DC
  Filled 2016-09-02: qty 1.2

## 2016-09-02 MED ORDER — COLCHICINE 0.6 MG PO TABS: 0.6000 mg | ORAL_TABLET | Freq: Two times a day (BID) | ORAL | 0 refills | Status: DC

## 2016-09-02 MED ORDER — HYDROCODONE-ACETAMINOPHEN 5-325 MG PO TABS: 1.0000 | ORAL_TABLET | Freq: Four times a day (QID) | ORAL | 0 refills | Status: DC | PRN

## 2016-09-02 MED ORDER — KETOROLAC TROMETHAMINE 60 MG/2ML IM SOLN
60.0000 mg | Freq: Once | INTRAMUSCULAR | Status: AC
  Administered 2016-09-02: 60 mg via INTRAMUSCULAR
  Filled 2016-09-02: qty 2

## 2016-09-02 MED ORDER — NAPROXEN 500 MG PO TABS: 500.0000 mg | ORAL_TABLET | Freq: Two times a day (BID) | ORAL | 0 refills | Status: AC

## 2016-09-02 MED ORDER — PHENYTOIN SODIUM EXTENDED 100 MG PO CAPS
300.0000 mg | ORAL_CAPSULE | Freq: Every day | ORAL | Status: DC
  Administered 2016-09-02: 300 mg via ORAL
  Filled 2016-09-02: qty 3

## 2016-09-02 MED ORDER — ASPIRIN EC 325 MG PO TBEC
325.0000 mg | DELAYED_RELEASE_TABLET | Freq: Every day | ORAL | Status: DC
  Administered 2016-09-02: 325 mg via ORAL
  Filled 2016-09-02: qty 1

## 2016-09-02 MED ORDER — ATORVASTATIN CALCIUM 40 MG PO TABS
40.0000 mg | ORAL_TABLET | Freq: Every day | ORAL | Status: DC
  Filled 2016-09-02: qty 1

## 2016-09-02 MED ORDER — METFORMIN HCL 500 MG PO TABS: 1000.0000 mg | ORAL_TABLET | Freq: Two times a day (BID) | ORAL | Status: DC

## 2016-09-02 MED ORDER — LOSARTAN POTASSIUM 50 MG PO TABS
100.0000 mg | ORAL_TABLET | Freq: Every day | ORAL | Status: DC
  Filled 2016-09-02: qty 2

## 2016-09-02 MED FILL — COLCHICINE 0.6 MG TABLET: 0.6 | 7 days supply | Qty: 14 | Fill #0

## 2016-09-02 MED FILL — NAPROXEN 500 MG TABLET: 500 | 5 days supply | Qty: 10 | Fill #0

## 2016-09-02 NOTE — ED Triage Notes (Signed)
Pt reports R hand swelling that began Sunday. Denies known injury. Reports burning sensation in palm area. Denies numbness/tingling. Also reports L middle swelling approx 2 wks ago (denies known injury). Pt denies new physical activities and/or repetitive movements. Denies fever.

## 2016-09-02 NOTE — ED Provider Notes (Signed)
[facility] DEPT [facility] Provider Note   CSN: [id number] Arrival date & time: 09/02/16  0932     History   Chief Complaint Chief Complaint  Patient presents with  . Hand Problem    HPI Albert Garcia is a 45 y.o. male.  The history is provided by the patient.  Hand Pain  This is a new problem. The current episode started 2 days ago. The problem occurs constantly. The problem has been gradually worsening. Pertinent negatives include no chest pain and no abdominal pain. Exacerbated by: movement and palpation. Nothing relieves the symptoms. He has tried rest for the symptoms. The treatment provided no relief.    Past Medical History:  Diagnosis Date  . Diabetes mellitus   . Erectile dysfunction   . Headache   . Hypertension   . Seizures [facility])     Patient Active Problem List   Diagnosis Date Noted  . 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 Long-term use of high-risk medication 12/27/2015  . Type 2 diabetes mellitus with microalbuminuria, with long-term current use of insulin ([place]) 11/15/2015  . Hyperlipidemia associated with type 2 diabetes mellitus ([place]) 11/15/2015  . Erectile dysfunction 11/13/2015  . Muscle cramps 11/13/2015  . Type 2 diabetes mellitus, uncontrolled ([place]) 09/06/2007  . OBESITY 09/06/2007  . HTN (hypertension) 09/06/2007  . Seizure disorder ([place]) 09/06/2007    Past Surgical History:  Procedure Laterality Date  . NO PAST SURGERIES         Home Medications    Prior to Admission medications   Medication Sig Start Date End Date Taking? Authorizing Provider  acetaminophen (ARTHRITIS PAIN RELIEF) 650 MG CR tablet Take 650 mg by mouth every 8 (eight) hours as needed for pain. Reported on 11/13/2015   Yes Historical Provider, MD  aspirin 325 MG EC tablet Take 325 mg by mouth daily.   Yes Historical Provider, MD  atorvastatin (LIPITOR) 40 MG tablet Take 1 tablet (40 mg total) by mouth daily. 01/21/16  Yes Josalyn Funches, MD    cyclobenzaprine (FLEXERIL) 10 MG tablet Take 1 tablet (10 mg total) by mouth 3 (three) times daily as needed for muscle spasms. 11/13/15  Yes Josalyn Funches, MD  exenatide (BYETTA 5 MCG PEN) 5 MCG/0.02ML SOPN injection Inject 0.02 mLs (5 mcg total) into the skin 2 (two) times daily with a meal. 02/20/16  Yes Josalyn Funches, MD  glimepiride (AMARYL) 4 MG tablet TAKE 1 TABLET BY MOUTH DAILY WITH BREAKFAST 08/13/16  Yes Josalyn Funches, MD  Insulin Glargine (LANTUS SOLOSTAR) 100 UNIT/ML Solostar Pen Inject 45 Units into the skin at bedtime. Patient taking differently: Inject 35 Units into the skin at bedtime.  11/13/15  Yes Josalyn Funches, MD  losartan (COZAAR) 100 MG tablet Take 1 tablet (100 mg total) by mouth daily. 01/21/16  Yes Boykin Nearing, MD  metFORMIN (GLUCOPHAGE) 1000 MG tablet Take 1 tablet (1,000 mg total) by mouth 2 (two) times daily with a meal. 11/13/15  Yes Josalyn Funches, MD  Multiple Vitamin (MULTIVITAMIN WITH MINERALS) TABS tablet Take 1 tablet by mouth daily. Reported on 11/13/2015   Yes Historical Provider, MD  phenytoin (DILANTIN) 100 MG ER capsule Take 3 capsules (300 mg total) by mouth daily. Take 3 capsules once a day 05/02/16  Yes Josalyn Funches, MD  tadalafil (CIALIS) 20 MG tablet Take 0.5-1 tablets (10-20 mg total) by mouth every other day as needed for erectile dysfunction. 02/20/16  Yes Boykin Nearing, MD  Vitamin D, Ergocalciferol, (DRISDOL) 50000 units CAPS capsule Take  1 capsule weekly for 8 weeks. 01/11/16  Yes Cameron Sprang, MD  naproxen (NAPROSYN) 500 MG tablet Take 1 tablet (500 mg total) by mouth 2 (two) times daily. 01/29/68   Delora Fuel, MD    Family History Family History  Problem Relation Age of Onset  . Hypertension Mother   . Diabetes Mother   . Lupus Mother   . Kidney failure Mother   . Diabetes Father   . Hypertension Father   . Kidney failure Father     Social History Social History  Substance Use Topics  . Smoking status: Never Smoker  .  Smokeless tobacco: Never Used  . Alcohol use 0.0 oz/week     Comment: Occasionally throughout year     Allergies   Patient has no known allergies.   Review of Systems Review of Systems  Cardiovascular: Negative for chest pain.  Gastrointestinal: Negative for abdominal pain.  All other systems reviewed and are negative.    Physical Exam Updated Vital Signs BP 153/78 (BP Location: Left Arm)   Pulse 103   Temp 98.4 F (36.9 C) (Oral)   Resp 16   Ht '5\' 8"'  (1.727 m)   Wt 245 lb (111.1 kg)   SpO2 96%   BMI 37.25 kg/m   Physical Exam  Constitutional: He is oriented to person, place, and time. He appears well-developed and well-nourished. No distress.  HENT:  Head: Normocephalic and atraumatic.  Nose: Nose normal.  Eyes: Conjunctivae are normal.  Neck: Neck supple. No tracheal deviation present.  Cardiovascular: Normal rate and regular rhythm.   Pulmonary/Chest: Effort normal. No respiratory distress.  Abdominal: Soft. He exhibits no distension.  Musculoskeletal:       Right hand: He exhibits tenderness (at 4th and 5th MCP and thenar eminence with mild overlying erythema and edema on palmar surface) and swelling. He exhibits normal range of motion, normal capillary refill and no deformity. Normal sensation noted. Normal strength noted.       Left hand: He exhibits swelling (minimal long finger diffusely without tederness).  Difficulty with thumb abduction 2/2 swelling  Neurological: He is alert and oriented to person, place, and time.  Skin: Skin is warm and dry.  Psychiatric: He has a normal mood and affect.     ED Treatments / Results  Labs (all labs ordered are listed, but only abnormal results are displayed) Labs Reviewed  BASIC METABOLIC PANEL - Abnormal; Notable for the following:       Result Value   Chloride 99 (*)    Glucose, Bld 451 (*)    All other components within normal limits  SEDIMENTATION RATE - Abnormal; Notable for the following:    Sed Rate 25  (*)    All other components within normal limits  C-REACTIVE PROTEIN - Abnormal; Notable for the following:    CRP 2.3 (*)    All other components within normal limits  URIC ACID - Abnormal; Notable for the following:    Uric Acid, Serum 4.0 (*)    All other components within normal limits  CBC WITH DIFFERENTIAL/PLATELET  CK    EKG  EKG Interpretation None       Radiology No results found.  Procedures Procedures (including critical care time)  Medications Ordered in ED Medications  phenytoin (DILANTIN) ER capsule 300 mg (300 mg Oral Given 09/02/16 1117)  losartan (COZAAR) tablet 100 mg (100 mg Oral Not Given 09/02/16 1128)  atorvastatin (LIPITOR) tablet 40 mg (40 mg Oral Not Given 09/02/16  1128)  aspirin EC tablet 325 mg (325 mg Oral Given 09/02/16 1117)  metFORMIN (GLUCOPHAGE) tablet 1,000 mg (1,000 mg Oral Given 09/02/16 1117)  glimepiride (AMARYL) tablet 4 mg (4 mg Oral Not Given 09/02/16 1128)  exenatide (BYETTA) injection 5 mcg (5 mcg Subcutaneous Not Given 09/02/16 1128)  ketorolac (TORADOL) injection 60 mg (60 mg Intramuscular Given 09/02/16 0928)     Initial Impression / Assessment and Plan / ED Course  I have reviewed the triage vital signs and the nursing notes.  Pertinent labs & imaging results that were available during my care of the patient were reviewed by me and considered in my medical decision making (see chart for details).  Clinical Course     45 y.o. male presents with Pain and swelling over the right hand thenar eminence and tenderness at the third and fourth MCP joints. He has had intermittent swelling of the left middle finger that has accompanied. His symptoms have progressed over the last 2 days in his right hand became swollen and red this morning. Unable to obtain definitive evaluation for gout crystals due to location but suspect this is more likely than a localized infection.  Patient has asymptomatic hyperglycemia after not taking home meds and  was given home medications that were available at this facility and was instructed to take the rest of his medicines and monitor his blood glucose at home.  The patient has no injury to the area, the skin is intact, he has no white blood cell count elevation, no fever, no extension of the erythema away from his hand. ESR is slightly elevated which may be seen in gout as well but is not dramatically elevated as would be suspected in infection. CRP sent for baseline.  The patient will be treated empirically for acute gouty arthritis and tendinitis. If the patient has clinical worsening of his symptoms he was instructed to come back to the emergency department where baseline lab values will be available and he can be reexamined in case this represents early cellulitis.  Final Clinical Impressions(s) / ED Diagnoses   Final diagnoses:  Acute gout of right hand, unspecified cause    New Prescriptions New Prescriptions   COLCHICINE 0.6 MG TABLET    Take 1 tablet (0.6 mg total) by mouth 2 (two) times daily.   HYDROCODONE-ACETAMINOPHEN (NORCO/VICODIN) 5-325 MG TABLET    Take 1 tablet by mouth every 6 (six) hours as needed for severe pain.   NAPROXEN (NAPROSYN) 500 MG TABLET    Take 1 tablet (500 mg total) by mouth 2 (two) times daily with a meal.     Leo Grosser, MD 09/02/16 1130

## 2016-09-03 MED FILL — HYDROCODON-APAP 5-325: 5-325 | 1 days supply | Qty: 4 | Fill #0

## 2016-09-12 ENCOUNTER — Encounter (HOSPITAL_BASED_OUTPATIENT_CLINIC_OR_DEPARTMENT_OTHER): Payer: Self-pay | Admitting: *Deleted

## 2016-09-12 ENCOUNTER — Emergency Department (HOSPITAL_BASED_OUTPATIENT_CLINIC_OR_DEPARTMENT_OTHER)
Admission: EM | Admit: 2016-09-12 | Discharge: 2016-09-12 | Disposition: A | Discharge status: Home / Self Care | Payer: Self-pay | Attending: Emergency Medicine | Admitting: Emergency Medicine

## 2016-09-12 DIAGNOSIS — Z7982 Long term (current) use of aspirin: Secondary | ICD-10-CM | POA: Insufficient documentation

## 2016-09-12 DIAGNOSIS — Z794 Long term (current) use of insulin: Secondary | ICD-10-CM | POA: Insufficient documentation

## 2016-09-12 DIAGNOSIS — I10 Essential (primary) hypertension: Secondary | ICD-10-CM | POA: Insufficient documentation

## 2016-09-12 DIAGNOSIS — L02214 Cutaneous abscess of groin: Secondary | ICD-10-CM | POA: Insufficient documentation

## 2016-09-12 DIAGNOSIS — E119 Type 2 diabetes mellitus without complications: Secondary | ICD-10-CM | POA: Insufficient documentation

## 2016-09-12 DIAGNOSIS — L02519 Cutaneous abscess of unspecified hand: Secondary | ICD-10-CM

## 2016-09-12 DIAGNOSIS — L02511 Cutaneous abscess of right hand: Secondary | ICD-10-CM | POA: Insufficient documentation

## 2016-09-12 MED ORDER — SULFAMETHOXAZOLE-TRIMETHOPRIM 800-160 MG PO TABS: 1.0000 | ORAL_TABLET | Freq: Two times a day (BID) | ORAL | 0 refills | Status: AC

## 2016-09-12 MED ORDER — HYDROCODONE-ACETAMINOPHEN 5-325 MG PO TABS: 1.0000 | ORAL_TABLET | ORAL | 0 refills | Status: DC | PRN

## 2016-09-12 MED ORDER — LIDOCAINE HCL (PF) 1 % IJ SOLN
5.0000 mL | Freq: Once | INTRAMUSCULAR | Status: DC
  Filled 2016-09-12: qty 5

## 2016-09-12 NOTE — ED Provider Notes (Signed)
[facility] Provider Note   CSN: [id number] Arrival date & time: 09/12/16  1908  By signing my name below, I, Alyssa Grove, attest that this documentation has been prepared under the direction and in the presence of Rolan Bucco, MD. Electronically Signed: Alyssa Grove, ED Scribe. 09/12/16. 7:59 PM.   History   Chief Complaint Chief Complaint  Patient presents with  . Hand Pain   The history is provided by the patient. No language interpreter was used.    HPI Comments: Albert Garcia is a 45 y.o. male with PMHx of DM and HTN who presents to the Emergency Department complaining of gradual onset and worsening, constant swelling and pain to the palmar aspect of the right hand onset 1.5 weeks. He was seen in ED last week for the same symptoms, but after treatment for Gout, he has had no noticeable improvement to symptoms. Pt also complaining of area of induration and swelling to the left groin onset 2 days PTA. Pt denies fever   Past Medical History:  Diagnosis Date  . Diabetes mellitus   . Erectile dysfunction   . Headache   . Hypertension   . Seizures [facility])     Patient Active Problem List   Diagnosis Date Noted  . Epilepsy, generalized, convulsive ([facility]) 12/27/2015  . Long-term use of high-risk medication 12/27/2015  . Type 2 diabetes mellitus with microalbuminuria, with long-term current use of insulin ([facility]) 11/15/2015  . Hyperlipidemia associated with type 2 diabetes mellitus ([facility]) 11/15/2015  . Erectile dysfunction 11/13/2015  . Muscle cramps 11/13/2015  . Type 2 diabetes mellitus, uncontrolled ([facility]) 09/06/2007  . OBESITY 09/06/2007  . HTN (hypertension) 09/06/2007  . Seizure disorder ([facility]) 09/06/2007    Past Surgical History:  Procedure Laterality Date  . NO PAST SURGERIES         Home Medications    Prior to Admission medications   Medication Sig Start Date End Date Taking? Authorizing Provider  acetaminophen (ARTHRITIS PAIN RELIEF) 650 MG CR  tablet Take 650 mg by mouth every 8 (eight) hours as needed for pain. Reported on 11/13/2015    Historical Provider, MD  aspirin 325 MG EC tablet Take 325 mg by mouth daily.    Historical Provider, MD  atorvastatin (LIPITOR) 40 MG tablet Take 1 tablet (40 mg total) by mouth daily. 01/21/16   Josalyn Funches, MD  colchicine 0.6 MG tablet Take 1 tablet (0.6 mg total) by mouth 2 (two) times daily. 09/02/16   Lyndal Pulley, MD  cyclobenzaprine (FLEXERIL) 10 MG tablet Take 1 tablet (10 mg total) by mouth 3 (three) times daily as needed for muscle spasms. 11/13/15   Josalyn Funches, MD  exenatide (BYETTA 5 MCG PEN) 5 MCG/0.02ML SOPN injection Inject 0.02 mLs (5 mcg total) into the skin 2 (two) times daily with a meal. 02/20/16   Josalyn Funches, MD  glimepiride (AMARYL) 4 MG tablet TAKE 1 TABLET BY MOUTH DAILY WITH BREAKFAST 08/13/16   Josalyn Funches, MD  HYDROcodone-acetaminophen (NORCO/VICODIN) 5-325 MG tablet Take 1-2 tablets by mouth every 4 (four) hours as needed. 09/12/16   Rolan Bucco, MD  Insulin Glargine (LANTUS SOLOSTAR) 100 UNIT/ML Solostar Pen Inject 45 Units into the skin at bedtime. Patient taking differently: Inject 35 Units into the skin at bedtime.  11/13/15   Josalyn Funches, MD  losartan (COZAAR) 100 MG tablet Take 1 tablet (100 mg total) by mouth daily. 01/21/16   Josalyn Funches, MD  metFORMIN (GLUCOPHAGE) 1000 MG tablet Take 1 tablet (1,000 mg  total) by mouth 2 (two) times daily with a meal. 11/13/15   Dessa PhiJosalyn Funches, MD  Multiple Vitamin (MULTIVITAMIN WITH MINERALS) TABS tablet Take 1 tablet by mouth daily. Reported on 11/13/2015    Historical Provider, MD  phenytoin (DILANTIN) 100 MG ER capsule Take 3 capsules (300 mg total) by mouth daily. Take 3 capsules once a day 05/02/16   Dessa PhiJosalyn Funches, MD  sulfamethoxazole-trimethoprim (BACTRIM DS,SEPTRA DS) 800-160 MG tablet Take 1 tablet by mouth 2 (two) times daily. 09/12/16 09/19/16  Rolan BuccoMelanie Traven Davids, MD  tadalafil (CIALIS) 20 MG tablet Take 0.5-1  tablets (10-20 mg total) by mouth every other day as needed for erectile dysfunction. 02/20/16   Josalyn Funches, MD  Vitamin D, Ergocalciferol, (DRISDOL) 50000 units CAPS capsule Take 1 capsule weekly for 8 weeks. 01/11/16   Van ClinesKaren M Aquino, MD    Family History Family History  Problem Relation Age of Onset  . Hypertension Mother   . Diabetes Mother   . Lupus Mother   . Kidney failure Mother   . Diabetes Father   . Hypertension Father   . Kidney failure Father     Social History Social History  Substance Use Topics  . Smoking status: Never Smoker  . Smokeless tobacco: Never Used  . Alcohol use 0.0 oz/week     Comment: Occasionally throughout year     Allergies   Patient has no known allergies.   Review of Systems Review of Systems  Constitutional: Negative for fever.  Eyes: Negative.   Musculoskeletal: Positive for arthralgias and joint swelling.  Skin: Positive for wound. Negative for rash.  All other systems reviewed and are negative.    Physical Exam Updated Vital Signs BP 144/92   Pulse 104   Temp 98 F (36.7 C) (Oral)   Resp 20   Ht 5\' 8"  (1.727 m)   Wt 245 lb (111.1 kg)   SpO2 99%   BMI 37.25 kg/m   Physical Exam  Constitutional: He is oriented to person, place, and time. He appears well-developed and well-nourished.  HENT:  Head: Normocephalic and atraumatic.  Eyes: Pupils are equal, round, and reactive to light.  Neck: Normal range of motion. Neck supple.  Cardiovascular: Normal rate, regular rhythm and normal heart sounds.   Pulmonary/Chest: Effort normal and breath sounds normal. No respiratory distress. He has no wheezes. He has no rales. He exhibits no tenderness.  Abdominal: Soft. Bowel sounds are normal. There is no tenderness. There is no rebound and no guarding.  Genitourinary:  Genitourinary Comments: 1cm abscess to right suprapubic area with small amount of purulent drainage to site  Musculoskeletal: Normal range of motion. He exhibits  no edema.  2cm fluctuant abscess to thenar eminence of right hand.  No pain with ROM of fingers.  No pain to dorsum of hand  Lymphadenopathy:    He has no cervical adenopathy.  Neurological: He is alert and oriented to person, place, and time.  Skin: Skin is warm and dry. No rash noted.  Psychiatric: He has a normal mood and affect.       ED Treatments / Results  DIAGNOSTIC STUDIES: Oxygen Saturation is 99% on RA, normal by my interpretation.    COORDINATION OF CARE: 7:53 PM Discussed treatment plan with pt at bedside which includes I&D and pt agreed to plan.  Labs (all labs ordered are listed, but only abnormal results are displayed) Labs Reviewed  AEROBIC CULTURE (SUPERFICIAL SPECIMEN)    EKG  EKG Interpretation None  Radiology No results found.  Procedures .Marland Kitchen.Incision and Drainage Date/Time: 09/12/2016 9:09 PM Performed by: Johnattan Strassman Authorized by: Rolan BuccoBELFI, Ramere Downs   Consent:    Consent obtained:  Verbal   Consent given by:  Patient   Risks discussed:  Bleeding, infection, incomplete drainage and pain   Alternatives discussed:  No treatment Location:    Type:  Abscess   Size:  2cm   Location:  Upper extremity   Upper extremity location:  Hand   Hand location:  R hand Pre-procedure details:    Skin preparation:  Betadine Anesthesia (see MAR for exact dosages):    Anesthesia method:  Local infiltration   Local anesthetic:  Lidocaine 1% w/o epi Procedure type:    Complexity:  Simple Procedure details:    Incision types:  Single straight   Incision depth:  Dermal   Scalpel blade:  11   Wound management:  Probed and deloculated   Drainage:  Bloody   Wound treatment:  Wound left open   Packing materials:  1/2 in iodoform gauze   Amount 1/2" iodoform:  1cm Post-procedure details:    Patient tolerance of procedure:  Tolerated well, no immediate complications Comments:     No purulent drainage from site .Marland Kitchen.Incision and Drainage Date/Time:  09/12/2016 9:11 PM Performed by: Mady Oubre Authorized by: Rolan BuccoBELFI, Jalacia Mattila   Consent:    Consent obtained:  Verbal   Consent given by:  Patient   Risks discussed:  Bleeding, incomplete drainage and infection   Alternatives discussed:  No treatment Location:    Type:  Abscess   Size:  1cm   Location: left groin. Pre-procedure details:    Skin preparation:  Betadine Anesthesia (see MAR for exact dosages):    Anesthesia method:  Local infiltration   Local anesthetic:  Lidocaine 1% w/o epi Procedure type:    Complexity:  Simple Procedure details:    Incision types:  Elliptical   Incision depth:  Dermal   Scalpel blade:  11   Drainage:  Purulent   Drainage amount:  Scant   Wound treatment:  Wound left open   Packing materials:  None Post-procedure details:    Patient tolerance of procedure:  Tolerated well, no immediate complications   (including critical care time)  Medications Ordered in ED Medications  lidocaine (PF) (XYLOCAINE) 1 % injection 5 mL (not administered)     Initial Impression / Assessment and Plan / ED Course  I have reviewed the triage vital signs and the nursing notes.  Pertinent labs & imaging results that were available during my care of the patient were reviewed by me and considered in my medical decision making (see chart for details).  Clinical Course    I personally performed the services described in this documentation, which was scribed in my presence.  The recorded information has been reviewed and considered.  Patient presents with an apparent abscess to the thenar eminence of his right hand. He doesn't appear systemically ill. He's afebrile. There is no surrounding cellulitis noted. There is no evidence of deep tissue infection as patient has no pain with range of motion of the fingers or flexion of the wrist. I discussed this with Dr. Mina MarbleWeingold.  It was decided that I would go ahead and attempt INT in the ED and Dr. Mina MarbleWeingold would follow the  patient up on Monday in his office. The apparent abscess was opened although I didn't obtain any purulent fluid. It was briefly explored but no loculations were noted. The wound was  packed. A dressing was applied. I will start the patient on Bactrim and Vicodin for pain. He was advised to follow-up with Dr. Mina Marble on Monday or return to the emergency department if he has any worsening symptoms over the weekend. The groin abscess was opened. He was advised to use warm compresses to the area.  Final Clinical Impressions(s) / ED Diagnoses   Final diagnoses:  Hand abscess  Groin abscess    New Prescriptions New Prescriptions   HYDROCODONE-ACETAMINOPHEN (NORCO/VICODIN) 5-325 MG TABLET    Take 1-2 tablets by mouth every 4 (four) hours as needed.   SULFAMETHOXAZOLE-TRIMETHOPRIM (BACTRIM DS,SEPTRA DS) 800-160 MG TABLET    Take 1 tablet by mouth 2 (two) times daily.     Rolan Bucco, MD 09/12/16 2114

## 2016-09-12 NOTE — ED Triage Notes (Signed)
Swelling to his right hand. He was seen last week and treated for gout. Swelling is worse.

## 2016-09-12 NOTE — ED Notes (Signed)
Dressing applied to right hand and instructions for care given to the patient

## 2016-09-15 LAB — AEROBIC CULTURE W GRAM STAIN (SUPERFICIAL SPECIMEN)

## 2016-09-15 LAB — AEROBIC CULTURE  (SUPERFICIAL SPECIMEN): GRAM STAIN: NONE SEEN

## 2016-09-16 ENCOUNTER — Telehealth (HOSPITAL_BASED_OUTPATIENT_CLINIC_OR_DEPARTMENT_OTHER): Payer: Self-pay | Admitting: Emergency Medicine

## 2016-09-16 NOTE — Telephone Encounter (Signed)
Post ED Visit - Positive Culture Follow-up  Culture report reviewed by antimicrobial stewardship pharmacist:  []  Enzo BiNathan Batchelder, Pharm.D. []  Celedonio MiyamotoJeremy Frens, Pharm.D., BCPS []  Garvin FilaMike Maccia, Pharm.D. []  Georgina PillionElizabeth Martin, [address]Ph[address] BCPS []  KonterraMinh Pham, [address]Ph[address]D., BCPS, AAHIVP []  Estella HuskMichelle Turner, Pharm.D., BCPS, AAHIVP []  Tennis Mustassie Stewart, Pharm.D. []  Sherle Poeob Vincent, [address] Apryl Reece LeaderAnderson Pharm D  Positive wound culture Treated with bactrim DS, organism sensitive to the same and no further patient follow-up is required at this time.  Berle MullMiller, Devarious Pavek 09/16/2016, 11:32 AM

## 2016-09-22 ENCOUNTER — Other Ambulatory Visit: Payer: Self-pay | Admitting: Family Medicine

## 2016-09-22 DIAGNOSIS — E118 Type 2 diabetes mellitus with unspecified complications: Secondary | ICD-10-CM

## 2016-09-22 DIAGNOSIS — E1165 Type 2 diabetes mellitus with hyperglycemia: Principal | ICD-10-CM

## 2016-09-22 DIAGNOSIS — R809 Proteinuria, unspecified: Secondary | ICD-10-CM

## 2016-09-22 DIAGNOSIS — E1129 Type 2 diabetes mellitus with other diabetic kidney complication: Secondary | ICD-10-CM

## 2016-09-22 DIAGNOSIS — IMO0002 Reserved for concepts with insufficient information to code with codable children: Secondary | ICD-10-CM

## 2016-09-22 DIAGNOSIS — Z794 Long term (current) use of insulin: Principal | ICD-10-CM

## 2016-09-22 MED FILL — ?GLIMEPIRIDE 4 MG TABLET: 4 | 30 days supply | Qty: 30 | Fill #0

## 2016-09-22 MED FILL — ?METFORMIN HCL 1,000 MG TAB: 1000 | 30 days supply | Qty: 60 | Fill #0

## 2016-09-22 MED FILL — $LANTUS SOLOSTAR 100 UNITS/: 100 | 29 days supply | Qty: 9 | Fill #2

## 2016-09-22 MED FILL — ATORVASTATIN 40 MG TABLET: 40 | 30 days supply | Qty: 30 | Fill #1

## 2016-09-22 MED FILL — PHENYTOIN SOD EXT 100 MG CA: 100 | 30 days supply | Qty: 90 | Fill #5

## 2016-09-22 MED FILL — LOSARTAN POTASSIUM 100 MG T: 100 | 30 days supply | Qty: 30 | Fill #0

## 2016-11-03 ENCOUNTER — Other Ambulatory Visit: Payer: Self-pay | Admitting: Family Medicine

## 2016-11-03 DIAGNOSIS — E1165 Type 2 diabetes mellitus with hyperglycemia: Secondary | ICD-10-CM

## 2016-11-03 DIAGNOSIS — Z794 Long term (current) use of insulin: Principal | ICD-10-CM

## 2016-11-03 DIAGNOSIS — IMO0002 Reserved for concepts with insufficient information to code with codable children: Secondary | ICD-10-CM

## 2016-11-03 DIAGNOSIS — E118 Type 2 diabetes mellitus with unspecified complications: Principal | ICD-10-CM

## 2016-11-03 MED FILL — LOSARTAN POTASSIUM 100 MG T: 100 | 30 days supply | Qty: 30 | Fill #0

## 2016-11-03 MED FILL — ATORVASTATIN 40 MG TABLET: 40 | 30 days supply | Qty: 30 | Fill #2

## 2016-11-03 MED FILL — PHENYTOIN SOD EXT 100 MG CA: 100 | 90 days supply | Qty: 270 | Fill #0

## 2016-11-18 ENCOUNTER — Other Ambulatory Visit: Payer: Self-pay | Admitting: Family Medicine

## 2016-11-18 DIAGNOSIS — E1165 Type 2 diabetes mellitus with hyperglycemia: Secondary | ICD-10-CM

## 2016-11-18 DIAGNOSIS — IMO0002 Reserved for concepts with insufficient information to code with codable children: Secondary | ICD-10-CM

## 2016-11-18 DIAGNOSIS — Z794 Long term (current) use of insulin: Principal | ICD-10-CM

## 2016-11-18 DIAGNOSIS — E118 Type 2 diabetes mellitus with unspecified complications: Principal | ICD-10-CM

## 2016-11-18 MED FILL — GLIMEPIRIDE 4 MG TABLET: 4 | 30 days supply | Qty: 30 | Fill #0

## 2016-11-18 MED FILL — metFORMIN HCL 1000 MG TABS: 1000 | 30 days supply | Qty: 60 | Fill #0

## 2016-11-28 ENCOUNTER — Encounter: Payer: Self-pay | Admitting: Family Medicine

## 2016-11-28 ENCOUNTER — Ambulatory Visit: Payer: Self-pay | Attending: Family Medicine | Admitting: Family Medicine

## 2016-11-28 VITALS — BP 133/86 | HR 95 | Temp 97.7°F | Ht 68.0 in | Wt 254.4 lb

## 2016-11-28 DIAGNOSIS — E785 Hyperlipidemia, unspecified: Secondary | ICD-10-CM | POA: Insufficient documentation

## 2016-11-28 DIAGNOSIS — E559 Vitamin D deficiency, unspecified: Secondary | ICD-10-CM | POA: Insufficient documentation

## 2016-11-28 DIAGNOSIS — R358 Other polyuria: Secondary | ICD-10-CM | POA: Insufficient documentation

## 2016-11-28 DIAGNOSIS — E1169 Type 2 diabetes mellitus with other specified complication: Secondary | ICD-10-CM

## 2016-11-28 DIAGNOSIS — R739 Hyperglycemia, unspecified: Secondary | ICD-10-CM

## 2016-11-28 DIAGNOSIS — E1121 Type 2 diabetes mellitus with diabetic nephropathy: Secondary | ICD-10-CM

## 2016-11-28 DIAGNOSIS — E1165 Type 2 diabetes mellitus with hyperglycemia: Secondary | ICD-10-CM | POA: Insufficient documentation

## 2016-11-28 DIAGNOSIS — E118 Type 2 diabetes mellitus with unspecified complications: Secondary | ICD-10-CM

## 2016-11-28 DIAGNOSIS — R809 Proteinuria, unspecified: Secondary | ICD-10-CM | POA: Insufficient documentation

## 2016-11-28 DIAGNOSIS — IMO0002 Reserved for concepts with insufficient information to code with codable children: Secondary | ICD-10-CM

## 2016-11-28 DIAGNOSIS — E1129 Type 2 diabetes mellitus with other diabetic kidney complication: Secondary | ICD-10-CM

## 2016-11-28 DIAGNOSIS — Z23 Encounter for immunization: Secondary | ICD-10-CM

## 2016-11-28 DIAGNOSIS — G40909 Epilepsy, unspecified, not intractable, without status epilepticus: Secondary | ICD-10-CM | POA: Insufficient documentation

## 2016-11-28 DIAGNOSIS — Z794 Long term (current) use of insulin: Secondary | ICD-10-CM | POA: Insufficient documentation

## 2016-11-28 DIAGNOSIS — I10 Essential (primary) hypertension: Secondary | ICD-10-CM

## 2016-11-28 LAB — GLUCOSE, POCT (MANUAL RESULT ENTRY)
POC Glucose: 512 mg/dl — AB (ref 70–99)
POC Glucose: 318 mg/dl — AB (ref 70–99)

## 2016-11-28 LAB — COMPLETE METABOLIC PANEL WITH GFR
ALT: 39 U/L (ref 9–46)
AST: 21 U/L (ref 10–40)
Albumin: 3.7 g/dL (ref 3.6–5.1)
Alkaline Phosphatase: 65 U/L (ref 40–115)
BUN: 10 mg/dL (ref 7–25)
CO2: 25 mmol/L (ref 20–31)
Calcium: 9.1 mg/dL (ref 8.6–10.3)
Chloride: 100 mmol/L (ref 98–110)
Creat: 0.81 mg/dL (ref 0.60–1.35)
GFR, Est African American: >89 mL/min (ref >=60)
GFR, Est Non African American: >89 mL/min (ref >=60)
Glucose, Bld: 470 mg/dL — ABNORMAL HIGH (ref 65–99)
Potassium: 4 mmol/L (ref 3.5–5.3)
Sodium: 137 mmol/L (ref 135–146)
Total Bilirubin: 0.3 mg/dL (ref 0.2–1.2)
Total Protein: 6.8 g/dL (ref 6.1–8.1)

## 2016-11-28 LAB — POCT URINALYSIS DIPSTICK
BILIRUBIN UA: NEGATIVE
Blood, UA: NEGATIVE
Glucose, UA: 500
Ketones, UA: NEGATIVE
LEUKOCYTES UA: NEGATIVE
Nitrite, UA: NEGATIVE
PH UA: 6
Protein, UA: NEGATIVE
Urobilinogen, UA: 0.2

## 2016-11-28 LAB — LIPID PANEL
Cholesterol: 143 mg/dL (ref <200)
HDL: 28 mg/dL — ABNORMAL LOW (ref >40)
LDL Cholesterol: 45 mg/dL (ref <100)
Total CHOL/HDL Ratio: 5.1 Ratio — ABNORMAL HIGH (ref <5.0)
Triglycerides: 348 mg/dL — ABNORMAL HIGH (ref <150)
VLDL: 70 mg/dL — ABNORMAL HIGH (ref <30)

## 2016-11-28 LAB — POCT GLYCOSYLATED HEMOGLOBIN (HGB A1C): HEMOGLOBIN A1C: 12.6

## 2016-11-28 MED ORDER — TRUE METRIX METER W/DEVICE KIT: 1.0000 | PACK | 0 refills | Status: DC | PRN

## 2016-11-28 MED ORDER — INSULIN GLARGINE 100 UNIT/ML SOLOSTAR PEN: 30.0000 [IU] | PEN_INJECTOR | Freq: Every day | SUBCUTANEOUS | 5 refills | Status: DC

## 2016-11-28 MED ORDER — LOSARTAN POTASSIUM 100 MG PO TABS: 100.0000 mg | ORAL_TABLET | Freq: Every day | ORAL | 5 refills | Status: DC

## 2016-11-28 MED ORDER — METFORMIN HCL 1000 MG PO TABS: ORAL_TABLET | ORAL | 5 refills | Status: DC

## 2016-11-28 MED ORDER — PHENYTOIN SODIUM EXTENDED 100 MG PO CAPS: 300.0000 mg | ORAL_CAPSULE | Freq: Every day | ORAL | 3 refills | Status: DC

## 2016-11-28 MED ORDER — SODIUM CHLORIDE 0.9 % IV BOLUS (SEPSIS)
1000.0000 mL | Freq: Once | INTRAVENOUS | Status: AC
  Administered 2016-11-28: 1000 mL via INTRAVENOUS

## 2016-11-28 MED ORDER — INSULIN ASPART 100 UNIT/ML ~~LOC~~ SOLN
20.0000 [IU] | Freq: Once | SUBCUTANEOUS | Status: AC
  Administered 2016-11-28: 20 [IU] via SUBCUTANEOUS

## 2016-11-28 MED ORDER — METOCLOPRAMIDE HCL 5 MG PO TABS: 5.0000 mg | ORAL_TABLET | Freq: Three times a day (TID) | ORAL | 2 refills | Status: DC

## 2016-11-28 MED ORDER — GLUCOSE BLOOD VI STRP: 1.0000 | ORAL_STRIP | Freq: Three times a day (TID) | 11 refills | Status: DC

## 2016-11-28 MED ORDER — ATORVASTATIN CALCIUM 40 MG PO TABS: 40.0000 mg | ORAL_TABLET | Freq: Every day | ORAL | 5 refills | Status: DC

## 2016-11-28 MED ORDER — GLIMEPIRIDE 4 MG PO TABS: 4.0000 mg | ORAL_TABLET | Freq: Every day | ORAL | 5 refills | Status: DC

## 2016-11-28 MED ORDER — TRUEPLUS LANCETS 28G MISC: 1.0000 | Freq: Three times a day (TID) | 11 refills | Status: DC

## 2016-11-28 MED FILL — LOSARTAN POTASSIUM 100 MG T: 100 | 30 days supply | Qty: 30 | Fill #0

## 2016-11-28 MED FILL — TRUE METRIX TEST STRIP: 33 days supply | Qty: 100 | Fill #0

## 2016-11-28 MED FILL — METOCLOPRAMIDE 5 MG TABLET: 5 | 30 days supply | Qty: 90 | Fill #0

## 2016-11-28 MED FILL — $LANTUS SOLOSTAR 100 UNITS/: 100 | 30 days supply | Qty: 9 | Fill #0

## 2016-11-28 MED FILL — ?PHENYTOIN SOD EXT 100 MG C: 100 | 30 days supply | Qty: 90 | Fill #0

## 2016-11-28 MED FILL — ATORVASTATIN 40 MG TABLET: 40 | 30 days supply | Qty: 30 | Fill #0

## 2016-11-28 MED FILL — TRUEplus LANCETS 28G MISC: 33 days supply | Qty: 100 | Fill #0

## 2016-11-28 MED FILL — !TRUE METRIX BLOOD GLUCOSE: 365 days supply | Qty: 1 | Fill #0

## 2016-11-28 NOTE — Patient Instructions (Addendum)
Qadir was seen today for diabetes.  Diagnoses and all orders for this visit:  Uncontrolled type 2 diabetes mellitus with complication, with long-term current use of insulin (HCC) -     POCT glucose (manual entry) -     POCT glycosylated hemoglobin (Hb A1C) -     insulin aspart (novoLOG) injection 20 Units; Inject 0.2 mLs (20 Units total) into the skin once. -     POCT urinalysis dipstick -     sodium chloride 0.9 % bolus 1,000 mL; Inject 1,000 mLs into the vein once. -     COMPLETE METABOLIC PANEL WITH GFR -     Lipid Panel -     glimepiride (AMARYL) 4 MG tablet; Take 1 tablet (4 mg total) by mouth daily with breakfast. -     metFORMIN (GLUCOPHAGE) 1000 MG tablet; TAKE 1 TABLET BY MOUTH 2 TIMES DAILY WITH A MEAL. -     Insulin Glargine (LANTUS SOLOSTAR) 100 UNIT/ML Solostar Pen; Inject 30 Units into the skin at bedtime. -     atorvastatin (LIPITOR) 40 MG tablet; Take 1 tablet (40 mg total) by mouth daily. -     Blood Glucose Monitoring Suppl (TRUE METRIX METER) w/Device KIT; 1 each by Does not apply route as needed. -     glucose blood (TRUE METRIX BLOOD GLUCOSE TEST) test strip; 1 each by Other route 3 (three) times daily. -     TRUEPLUS LANCETS 28G MISC; 1 each by Does not apply route 3 (three) times daily. -     metoCLOPramide (REGLAN) 5 MG tablet; Take 1 tablet (5 mg total) by mouth 3 (three) times daily before meals.  Hyperglycemia -     sodium chloride 0.9 % bolus 1,000 mL; Inject 1,000 mLs into the vein once.  Hyperlipidemia associated with type 2 diabetes mellitus (HCC) -     atorvastatin (LIPITOR) 40 MG tablet; Take 1 tablet (40 mg total) by mouth daily.  Type 2 diabetes mellitus with microalbuminuria, with long-term current use of insulin (HCC) -     losartan (COZAAR) 100 MG tablet; Take 1 tablet (100 mg total) by mouth daily.  Seizure disorder (HCC) -     phenytoin (DILANTIN) 100 MG ER capsule; Take 3 capsules (300 mg total) by mouth daily. Take 3 capsules once a  day  Vitamin D deficiency -     Vitamin D, 25-hydroxy   Check and write down fasting sugars and pre-dinner sugars   Diabetes blood sugar goals  Fasting (in AM before breakfast, 8 hrs of no eating or drinking (except water or unsweetened coffee or tea): 90-130 2 hrs after meals: < 160,   No low sugars: nothing < 70   I agree with your plans to stop fried foods and replace with baked, grilled or broiled or boiled.  Cut out juice, make water your best friend. You can also drink carbonated water.  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 Adrian Blackwater

## 2016-11-28 NOTE — Progress Notes (Signed)
Pt is here today to follow up on diabetes. Pt has not had medication in 2 months. Pt is also having tingling in his feet.

## 2016-11-28 NOTE — Progress Notes (Signed)
Subjective:  Patient ID: Albert Garcia, male    DOB: Sep 27, 1971  Age: 46 y.o. MRN: [id number]  CC: Diabetes   HPI Albert Garcia presents for    1. CHRONIC DIABETES  Disease Monitoring  Blood Sugar Ranges: not checking   Polyuria: yes   Visual problems: yes   Medication Compliance: no, out of medications for 2 months  Medication Side Effects  Hypoglycemia: no   Preventitive Health Care  Eye Exam: due   Foot Exam: done today   Diet pattern: regular meals, no restrictions   Exercise: no   He reports early satiety, reflux, bloating at times. Also tingling, numbness and pain in feet.   Social History  Substance Use Topics  . Smoking status: Never Smoker  . Smokeless tobacco: Never Used  . Alcohol use 0.0 oz/week     Comment: Occasionally throughout year    Outpatient Medications Prior to Visit  Medication Sig Dispense Refill  . losartan (COZAAR) 100 MG tablet Take 1 tablet (100 mg total) by mouth daily. 30 tablet 0  . phenytoin (DILANTIN) 100 MG ER capsule Take 3 capsules (300 mg total) by mouth daily. Take 3 capsules once a day 270 capsule 3  . acetaminophen (ARTHRITIS PAIN RELIEF) 650 MG CR tablet Take 650 mg by mouth every 8 (eight) hours as needed for pain. Reported on 11/13/2015    . aspirin 325 MG EC tablet Take 325 mg by mouth daily.    Marland Kitchen atorvastatin (LIPITOR) 40 MG tablet Take 1 tablet (40 mg total) by mouth daily. (Patient not taking: Reported on 11/28/2016) 30 tablet 5  . colchicine 0.6 MG tablet Take 1 tablet (0.6 mg total) by mouth 2 (two) times daily. (Patient not taking: Reported on 11/28/2016) 14 tablet 0  . cyclobenzaprine (FLEXERIL) 10 MG tablet Take 1 tablet (10 mg total) by mouth 3 (three) times daily as needed for muscle spasms. (Patient not taking: Reported on 11/28/2016) 30 tablet 0  . exenatide (BYETTA 5 MCG PEN) 5 MCG/0.02ML SOPN injection Inject 0.02 mLs (5 mcg total) into the skin 2 (two) times daily with a meal. (Patient not taking: Reported on  11/28/2016) 3 mL 3  . glimepiride (AMARYL) 4 MG tablet TAKE 1 TABLET BY MOUTH DAILY WITH BREAKFAST. (Patient not taking: Reported on 11/28/2016) 30 tablet 0  . HYDROcodone-acetaminophen (NORCO/VICODIN) 5-325 MG tablet Take 1-2 tablets by mouth every 4 (four) hours as needed. (Patient not taking: Reported on 11/28/2016) 15 tablet 0  . Insulin Glargine (LANTUS SOLOSTAR) 100 UNIT/ML Solostar Pen Inject 45 Units into the skin at bedtime. (Patient not taking: Reported on 11/28/2016) 15 mL 5  . metFORMIN (GLUCOPHAGE) 1000 MG tablet TAKE 1 TABLET BY MOUTH 2 TIMES DAILY WITH A MEAL. (Patient not taking: Reported on 11/28/2016) 60 tablet 0  . Multiple Vitamin (MULTIVITAMIN WITH MINERALS) TABS tablet Take 1 tablet by mouth daily. Reported on 11/13/2015    . tadalafil (CIALIS) 20 MG tablet Take 0.5-1 tablets (10-20 mg total) by mouth every other day as needed for erectile dysfunction. (Patient not taking: Reported on 11/28/2016) 60 tablet 3  . Vitamin D, Ergocalciferol, (DRISDOL) 50000 units CAPS capsule Take 1 capsule weekly for 8 weeks. (Patient not taking: Reported on 11/28/2016) 8 capsule 0   No facility-administered medications prior to visit.     ROS Review of Systems  Constitutional: Negative for chills, fatigue, fever and unexpected weight change.  Eyes: Positive for visual disturbance.  Respiratory: Negative for cough and shortness of breath.  Cardiovascular: Negative for chest pain, palpitations and leg swelling.  Gastrointestinal: Negative for abdominal pain, blood in stool, constipation, diarrhea, nausea and vomiting.  Endocrine: Positive for polydipsia and polyuria. Negative for polyphagia.  Musculoskeletal: Negative for arthralgias, back pain, gait problem, myalgias and neck pain.  Skin: Negative for rash.  Allergic/Immunologic: Negative for immunocompromised state.  Hematological: Negative for adenopathy. Does not bruise/bleed easily.  Psychiatric/Behavioral: Negative for dysphoric mood, sleep  disturbance and suicidal ideas. The patient is not nervous/anxious.     Objective:  BP 133/86 (BP Location: Left Arm, Patient Position: Sitting, Cuff Size: Small)   Pulse 95   Temp 97.7 F (36.5 C) (Oral)   Ht '5\' 8"'  (1.727 m)   Wt 254 lb 6.4 oz (115.4 kg)   SpO2 96%   BMI 38.68 kg/m   BP/Weight 11/28/2016 09/12/2016 07/01/767  Systolic BP 088 110 315  Diastolic BP 86 89 81  Wt. (Lbs) 254.4 245 245  BMI 38.68 37.25 37.25    Physical Exam  Constitutional: He appears well-developed and well-nourished. No distress.  HENT:  Head: Normocephalic and atraumatic.  Neck: Normal range of motion. Neck supple.  Cardiovascular: Normal rate, regular rhythm, normal heart sounds and intact distal pulses.   Pulmonary/Chest: Effort normal and breath sounds normal.  Musculoskeletal: He exhibits no edema.  Neurological: He is alert.  Skin: Skin is warm and dry. No rash noted. No erythema.  Psychiatric: He has a normal mood and affect.    Lab Results  Component Value Date   HGBA1C 12.6 11/28/2016   CBG 512  UA: 500 glucose, negative ketones   Treated with 20 U novolog and I L NS  IV fluids  Repeat CBG 318 Assessment & Plan:   Commodore was seen today for diabetes.  Diagnoses and all orders for this visit:  Uncontrolled type 2 diabetes mellitus with complication, with long-term current use of insulin (HCC) -     POCT glucose (manual entry) -     POCT glycosylated hemoglobin (Hb A1C) -     insulin aspart (novoLOG) injection 20 Units; Inject 0.2 mLs (20 Units total) into the skin once. -     POCT urinalysis dipstick -     sodium chloride 0.9 % bolus 1,000 mL; Inject 1,000 mLs into the vein once. -     COMPLETE METABOLIC PANEL WITH GFR -     Lipid Panel -     glimepiride (AMARYL) 4 MG tablet; Take 1 tablet (4 mg total) by mouth daily with breakfast. -     metFORMIN (GLUCOPHAGE) 1000 MG tablet; TAKE 1 TABLET BY MOUTH 2 TIMES DAILY WITH A MEAL. -     Insulin Glargine (LANTUS SOLOSTAR) 100  UNIT/ML Solostar Pen; Inject 30 Units into the skin at bedtime. -     atorvastatin (LIPITOR) 40 MG tablet; Take 1 tablet (40 mg total) by mouth daily. -     Blood Glucose Monitoring Suppl (TRUE METRIX METER) w/Device KIT; 1 each by Does not apply route as needed. -     glucose blood (TRUE METRIX BLOOD GLUCOSE TEST) test strip; 1 each by Other route 3 (three) times daily. -     TRUEPLUS LANCETS 28G MISC; 1 each by Does not apply route 3 (three) times daily. -     metoCLOPramide (REGLAN) 5 MG tablet; Take 1 tablet (5 mg total) by mouth 3 (three) times daily before meals. -     POCT glucose (manual entry)  Hyperglycemia -     sodium  chloride 0.9 % bolus 1,000 mL; Inject 1,000 mLs into the vein once.  Hyperlipidemia associated with type 2 diabetes mellitus (HCC) -     atorvastatin (LIPITOR) 40 MG tablet; Take 1 tablet (40 mg total) by mouth daily.  Type 2 diabetes mellitus with microalbuminuria, with long-term current use of insulin (HCC) -     losartan (COZAAR) 100 MG tablet; Take 1 tablet (100 mg total) by mouth daily.  Seizure disorder (HCC) -     phenytoin (DILANTIN) 100 MG ER capsule; Take 3 capsules (300 mg total) by mouth daily. Take 3 capsules once a day  Vitamin D deficiency -     Vitamin D, 25-hydroxy  Encounter for immunization -     POCT glucose (manual entry) -     POCT glycosylated hemoglobin (Hb A1C) -     insulin aspart (novoLOG) injection 20 Units; Inject 0.2 mLs (20 Units total) into the skin once. -     POCT urinalysis dipstick -     sodium chloride 0.9 % bolus 1,000 mL; Inject 1,000 mLs into the vein once. -     COMPLETE METABOLIC PANEL WITH GFR -     Lipid Panel -     glimepiride (AMARYL) 4 MG tablet; Take 1 tablet (4 mg total) by mouth daily with breakfast. -     metFORMIN (GLUCOPHAGE) 1000 MG tablet; TAKE 1 TABLET BY MOUTH 2 TIMES DAILY WITH A MEAL. -     Insulin Glargine (LANTUS SOLOSTAR) 100 UNIT/ML Solostar Pen; Inject 30 Units into the skin at bedtime. -      atorvastatin (LIPITOR) 40 MG tablet; Take 1 tablet (40 mg total) by mouth daily. -     losartan (COZAAR) 100 MG tablet; Take 1 tablet (100 mg total) by mouth daily. -     phenytoin (DILANTIN) 100 MG ER capsule; Take 3 capsules (300 mg total) by mouth daily. Take 3 capsules once a day -     Vitamin D, 25-hydroxy -     Blood Glucose Monitoring Suppl (TRUE METRIX METER) w/Device KIT; 1 each by Does not apply route as needed. -     glucose blood (TRUE METRIX BLOOD GLUCOSE TEST) test strip; 1 each by Other route 3 (three) times daily. -     TRUEPLUS LANCETS 28G MISC; 1 each by Does not apply route 3 (three) times daily. -     metoCLOPramide (REGLAN) 5 MG tablet; Take 1 tablet (5 mg total) by mouth 3 (three) times daily before meals. -     Pneumococcal polysaccharide vaccine 23-valent greater than or equal to 2yo subcutaneous/IM -     POCT glucose (manual entry) -     Flu Vaccine QUAD 36+ mos IM  Vitamin D insufficiency -     Vitamin D, Ergocalciferol, (DRISDOL) 50000 units CAPS capsule; Take 1 capsule (50,000 Units total) by mouth every 30 (thirty) days.    Meds ordered this encounter  Medications  . insulin aspart (novoLOG) injection 20 Units  . sodium chloride 0.9 % bolus 1,000 mL  . glimepiride (AMARYL) 4 MG tablet    Sig: Take 1 tablet (4 mg total) by mouth daily with breakfast.    Dispense:  30 tablet    Refill:  5  . metFORMIN (GLUCOPHAGE) 1000 MG tablet    Sig: TAKE 1 TABLET BY MOUTH 2 TIMES DAILY WITH A MEAL.    Dispense:  60 tablet    Refill:  5  . Insulin Glargine (LANTUS SOLOSTAR) 100 UNIT/ML Solostar  Pen    Sig: Inject 30 Units into the skin at bedtime.    Dispense:  15 mL    Refill:  5  . atorvastatin (LIPITOR) 40 MG tablet    Sig: Take 1 tablet (40 mg total) by mouth daily.    Dispense:  30 tablet    Refill:  5  . losartan (COZAAR) 100 MG tablet    Sig: Take 1 tablet (100 mg total) by mouth daily.    Dispense:  30 tablet    Refill:  5  . phenytoin (DILANTIN) 100 MG  ER capsule    Sig: Take 3 capsules (300 mg total) by mouth daily. Take 3 capsules once a day    Dispense:  90 capsule    Refill:  3  . Blood Glucose Monitoring Suppl (TRUE METRIX METER) w/Device KIT    Sig: 1 each by Does not apply route as needed.    Dispense:  1 kit    Refill:  0  . glucose blood (TRUE METRIX BLOOD GLUCOSE TEST) test strip    Sig: 1 each by Other route 3 (three) times daily.    Dispense:  100 each    Refill:  11  . TRUEPLUS LANCETS 28G MISC    Sig: 1 each by Does not apply route 3 (three) times daily.    Dispense:  100 each    Refill:  11  . metoCLOPramide (REGLAN) 5 MG tablet    Sig: Take 1 tablet (5 mg total) by mouth 3 (three) times daily before meals.    Dispense:  90 tablet    Refill:  2  . Vitamin D, Ergocalciferol, (DRISDOL) 50000 units CAPS capsule    Sig: Take 1 capsule (50,000 Units total) by mouth every 30 (thirty) days.    Dispense:  4 capsule    Refill:  3    Follow-up: Return in about 2 weeks (around 12/12/2016) for CBG review .   Boykin Nearing MD

## 2016-11-29 LAB — VITAMIN D 25 HYDROXY (VIT D DEFICIENCY, FRACTURES): VIT D 25 HYDROXY: 25 ng/mL — AB (ref 30–100)

## 2016-11-30 DIAGNOSIS — E559 Vitamin D deficiency, unspecified: Secondary | ICD-10-CM | POA: Insufficient documentation

## 2016-11-30 MED ORDER — VITAMIN D (ERGOCALCIFEROL) 1.25 MG (50000 UNIT) PO CAPS
50000.0000 [IU] | ORAL_CAPSULE | ORAL | 3 refills | Status: DC
Start: 2016-11-30 — End: 2017-07-31

## 2016-11-30 NOTE — Assessment & Plan Note (Addendum)
Uncontrolled diabetes with hyperglycemia due to medication non-compliance and lack of follow up Plan: Treated hyperglycemia in office Restart home therapy with lantus 30 U daily, metformin 1000 mg BID, Amaryl 4 mg daily Not restarting byetta due to inconsistent availability   Reglan for gastroparesis symptoms Consider gabapentin for neuropathy symptoms if normalizing blood sugars does not significantly improve symptoms

## 2016-11-30 NOTE — Assessment & Plan Note (Signed)
Continue losartan. 

## 2016-11-30 NOTE — Assessment & Plan Note (Signed)
Normotensive with uncontrolled diabetes  Continue losartan 100 mg daily

## 2016-11-30 NOTE — Assessment & Plan Note (Signed)
No longer vitamin D deficient, now insufficient Vit D 50 K U weekly

## 2016-12-02 MED FILL — VIT D2 1.25 MG (50,000 UNIT: 1.25 MG | 28 days supply | Qty: 4 | Fill #0

## 2016-12-03 ENCOUNTER — Telehealth: Payer: Self-pay

## 2016-12-03 NOTE — Telephone Encounter (Signed)
Pt was called and a VM was left informing pt of lab results and medication that was sent over to onsite pharmacy.

## 2016-12-16 ENCOUNTER — Ambulatory Visit: Payer: Self-pay | Admitting: Pharmacist

## 2016-12-26 ENCOUNTER — Ambulatory Visit: Payer: Self-pay | Admitting: Neurology

## 2016-12-26 ENCOUNTER — Encounter: Payer: Self-pay | Admitting: Neurology

## 2016-12-26 DIAGNOSIS — Z029 Encounter for administrative examinations, unspecified: Secondary | ICD-10-CM

## 2017-01-07 MED FILL — GLIMEPIRIDE 4 MG TABLET: 4 | 30 days supply | Qty: 30 | Fill #0

## 2017-01-07 MED FILL — ?METFORMIN HCL 1,000 MG TAB: 1000 | 30 days supply | Qty: 60 | Fill #0

## 2017-01-07 MED FILL — $LANTUS SOLOSTAR 100 UNITS/: 100 | 30 days supply | Qty: 9 | Fill #1

## 2017-01-09 ENCOUNTER — Ambulatory Visit: Payer: Self-pay | Admitting: Neurology

## 2017-01-13 ENCOUNTER — Encounter: Payer: Self-pay | Admitting: Neurology

## 2017-01-16 ENCOUNTER — Telehealth: Payer: Self-pay | Admitting: Neurology

## 2017-01-16 NOTE — Telephone Encounter (Signed)
Patient dismissed from [facility] by Patrcia DollyKaren Aquino MD, effective March 20, 218. Dismissal letter sent out by certified / registered mail. DAJ

## 2017-01-27 MED FILL — LANTUS SOLOSTAR 100 UNITS/M: 100 | 30 days supply | Qty: 9 | Fill #2

## 2017-01-27 MED FILL — metFORMIN HCL 1000 MG TABS: 1000 | 30 days supply | Qty: 60 | Fill #1

## 2017-01-27 MED FILL — GLIMEPIRIDE 4 MG TABLET: 4 | 30 days supply | Qty: 30 | Fill #1

## 2017-01-27 MED FILL — VIT D2 1.25 MG (50,000 UNIT: 1.25 MG | 30 days supply | Qty: 1 | Fill #1

## 2017-02-03 MED FILL — LOSARTAN POTASSIUM 100 MG T: 100 | 30 days supply | Qty: 30 | Fill #1

## 2017-02-03 MED FILL — ?PHENYTOIN SOD EXT 100 MG C: 100 | 30 days supply | Qty: 90 | Fill #1

## 2017-02-03 MED FILL — ?ATORVASTATIN 40MG TABLET: 40 | 30 days supply | Qty: 30 | Fill #1

## 2017-02-04 ENCOUNTER — Ambulatory Visit: Payer: BLUE CROSS/BLUE SHIELD | Attending: Family Medicine | Admitting: Pharmacist

## 2017-02-04 DIAGNOSIS — E1165 Type 2 diabetes mellitus with hyperglycemia: Secondary | ICD-10-CM | POA: Diagnosis not present

## 2017-02-04 DIAGNOSIS — N529 Male erectile dysfunction, unspecified: Secondary | ICD-10-CM | POA: Insufficient documentation

## 2017-02-04 DIAGNOSIS — Z79899 Other long term (current) drug therapy: Secondary | ICD-10-CM

## 2017-02-04 DIAGNOSIS — E119 Type 2 diabetes mellitus without complications: Secondary | ICD-10-CM | POA: Diagnosis present

## 2017-02-04 DIAGNOSIS — Z794 Long term (current) use of insulin: Secondary | ICD-10-CM

## 2017-02-04 DIAGNOSIS — E118 Type 2 diabetes mellitus with unspecified complications: Secondary | ICD-10-CM

## 2017-02-04 DIAGNOSIS — IMO0002 Reserved for concepts with insufficient information to code with codable children: Secondary | ICD-10-CM

## 2017-02-04 DIAGNOSIS — I10 Essential (primary) hypertension: Secondary | ICD-10-CM | POA: Diagnosis not present

## 2017-02-04 MED ORDER — INSULIN GLARGINE 100 UNIT/ML SOLOSTAR PEN: 35.0000 [IU] | PEN_INJECTOR | Freq: Every day | SUBCUTANEOUS | 5 refills | Status: DC

## 2017-02-04 NOTE — Patient Instructions (Addendum)
Thanks for coming to see [place]  Increase Lantus to 35 units daily  Follow up with Dr. Adrian Blackwater in 2 weeks for diabetes follow up and erectile dysfunction   Carbohydrate Counting for Diabetes Mellitus, Adult Carbohydrate counting is a method for keeping track of how many carbohydrates you eat. Eating carbohydrates naturally increases the amount of sugar (glucose) in the blood. Counting how many carbohydrates you eat helps keep your blood glucose within normal limits, which helps you manage your diabetes (diabetes mellitus). It is important to know how many carbohydrates you can safely have in each meal. This is different for every person. A diet and nutrition specialist (registered dietitian) can help you make a meal plan and calculate how many carbohydrates you should have at each meal and snack. Carbohydrates are found in the following foods:  Grains, such as breads and cereals.  Dried beans and soy products.  Starchy vegetables, such as potatoes, peas, and corn.  Fruit and fruit juices.  Milk and yogurt.  Sweets and snack foods, such as cake, cookies, candy, chips, and soft drinks. How do I count carbohydrates? There are two ways to count carbohydrates in food. You can use either of the methods or a combination of both. Reading "Nutrition Facts" on packaged food  The "Nutrition Facts" list is included on the labels of almost all packaged foods and beverages in the [place] It includes:  The serving size.  Information about nutrients in each serving, including the grams (g) of carbohydrate per serving. To use the "Nutrition Facts":  Decide how many servings you will have.  Multiply the number of servings by the number of carbohydrates per serving.  The resulting number is the total amount of carbohydrates that you will be having. Learning standard serving sizes of other foods  When you eat foods containing carbohydrates that are not packaged or do not include "Nutrition Facts" on the  label, you need to measure the servings in order to count the amount of carbohydrates:  Measure the foods that you will eat with a food scale or measuring cup, if needed.  Decide how many standard-size servings you will eat.  Multiply the number of servings by 15. Most carbohydrate-rich foods have about 15 g of carbohydrates per serving.  For example, if you eat 8 oz (170 g) of strawberries, you will have eaten 2 servings and 30 g of carbohydrates (2 servings x 15 g = 30 g).  For foods that have more than one food mixed, such as soups and casseroles, you must count the carbohydrates in each food that is included. The following list contains standard serving sizes of common carbohydrate-rich foods. Each of these servings has about 15 g of carbohydrates:   hamburger bun or  English muffin.   oz (15 mL) syrup.   oz (14 g) jelly.  1 slice of bread.  1 six-inch tortilla.  3 oz (85 g) cooked rice or pasta.  4 oz (113 g) cooked dried beans.  4 oz (113 g) starchy vegetable, such as peas, corn, or potatoes.  4 oz (113 g) hot cereal.  4 oz (113 g) mashed potatoes or  of a large baked potato.  4 oz (113 g) canned or frozen fruit.  4 oz (120 mL) fruit juice.  4-6 crackers.  6 chicken nuggets.  6 oz (170 g) unsweetened dry cereal.  6 oz (170 g) plain fat-free yogurt or yogurt sweetened with artificial sweeteners.  8 oz (240 mL) milk.  8 oz (170 g)  fresh fruit or one small piece of fruit.  24 oz (680 g) popped popcorn. Example of carbohydrate counting Sample meal   3 oz (85 g) chicken breast.  6 oz (170 g) brown rice.  4 oz (113 g) corn.  8 oz (240 mL) milk.  8 oz (170 g) strawberries with sugar-free whipped topping. Carbohydrate calculation  1. Identify the foods that contain carbohydrates:  Rice.  Corn.  Milk.  Strawberries. 2. Calculate how many servings you have of each food:  2 servings rice.  1 serving corn.  1 serving milk.  1 serving  strawberries. 3. Multiply each number of servings by 15 g:  2 servings rice x 15 g = 30 g.  1 serving corn x 15 g = 15 g.  1 serving milk x 15 g = 15 g.  1 serving strawberries x 15 g = 15 g. 4. Add together all of the amounts to find the total grams of carbohydrates eaten:  30 g + 15 g + 15 g + 15 g = 75 g of carbohydrates total. This information is not intended to replace advice given to you by your health care provider. Make sure you discuss any questions you have with your health care provider. Document Released: 10/13/2005 Document Revised: 05/02/2016 Document Reviewed: 03/26/2016 Elsevier Interactive Patient Education  2017 [place].  Hypoglycemia  Hypoglycemia is when the sugar (glucose) level in the blood is too low. Symptoms of low blood sugar may include:  Feeling:  Hungry.  Worried or nervous (anxious).  Sweaty and clammy.  Confused.  Dizzy.  Sleepy.  Sick to your stomach (nauseous).  Having:  A fast heartbeat.  A headache.  A change in your vision.  Jerky movements that you cannot control (seizure).  Nightmares.  Tingling or no feeling (numbness) around the mouth, lips, or tongue.  Having trouble with:  Talking.  Paying attention (concentrating).  Moving (coordination).  Sleeping.  Shaking.  Passing out (fainting).  Getting upset easily (irritability). Low blood sugar can happen to people who have diabetes and people who do not have diabetes. Low blood sugar can happen quickly, and it can be an emergency. Treating Low Blood Sugar  Low blood sugar is often treated by eating or drinking something sugary right away. If you can think clearly and swallow safely, follow the 15:15 rule:  Take 15 grams of a fast-acting carb (carbohydrate). Some fast-acting carbs are:  1 tube of glucose gel.  3 sugar tablets (glucose pills).  6-8 pieces of hard candy.  4 oz (120 mL) of fruit juice.  4 oz (120 mL) of regular (not diet)  soda.  Check your blood sugar 15 minutes after you take the carb.  If your blood sugar is still at or below 70 mg/dL (3.9 mmol/L), take 15 grams of a carb again.  If your blood sugar does not go above 70 mg/dL (3.9 mmol/L) after 3 tries, get help right away.  After your blood sugar goes back to normal, eat a meal or a snack within 1 hour. Treating Very Low Blood Sugar  If your blood sugar is at or below 54 mg/dL (3 mmol/L), you have very low blood sugar (severe hypoglycemia). This is an emergency. Do not wait to see if the symptoms will go away. Get medical help right away. Call your local emergency services (911 in the [place] Do not drive yourself to the hospital. If you have very low blood sugar and you cannot eat or drink, you may  need a glucagon shot (injection). A family member or friend should learn how to check your blood sugar and how to give you a glucagon shot. Ask your doctor if you need to have a glucagon shot kit at home. Follow these instructions at home: General instructions   Avoid any diets that cause you to not eat enough food. Talk with your doctor before you start any new diet.  Take over-the-counter and prescription medicines only as told by your doctor.  Limit alcohol to no more than 1 drink per day for nonpregnant women and 2 drinks per day for men. One drink equals 12 oz of beer, 5 oz of wine, or 1 oz of hard liquor.  Keep all follow-up visits as told by your doctor. This is important. If You Have Diabetes:    Make sure you know the symptoms of low blood sugar.  Always keep a source of sugar with you, such as:  Sugar.  Sugar tablets.  Glucose gel.  Fruit juice.  Regular soda (not diet soda).  Milk.  Hard candy.  Honey.  Take your medicines as told.  Follow your exercise and meal plan.  Eat on time. Do not skip meals.  Follow your sick day plan when you cannot eat or drink normally. Make this plan ahead of time with your doctor.  Check your  blood sugar as often as told by your doctor. Always check before and after exercise.  Share your diabetes care plan with:  Your work or school.  People you live with.  Check your pee (urine) for ketones:  When you are sick.  As told by your doctor.  Carry a card or wear jewelry that says you have diabetes. If You Have Low Blood Sugar From Other Causes:    Check your blood sugar as often as told by your doctor.  Follow instructions from your doctor about what you cannot eat or drink. Contact a doctor if:  You have trouble keeping your blood sugar in your target range.  You have low blood sugar often. Get help right away if:  You still have symptoms after you eat or drink something sugary.  Your blood sugar is at or below 54 mg/dL (3 mmol/L).  You have jerky movements that you cannot control.  You pass out. These symptoms may be an emergency. Do not wait to see if the symptoms will go away. Get medical help right away. Call your local emergency services (911 in the [place] Do not drive yourself to the hospital. This information is not intended to replace advice given to you by your health care provider. Make sure you discuss any questions you have with your health care provider. Document Released: 01/07/2010 Document Revised: 03/20/2016 Document Reviewed: 11/16/2015 Elsevier Interactive Patient Education  2017 [employer].

## 2017-02-04 NOTE — Progress Notes (Signed)
    S:     Chief Complaint  Patient presents with  . Medication Management    Patient arrives in good spirits.  Presents for diabetes evaluation, education, and management at the request of Dr. Armen Pickup. Patient was referred on 11/28/16.  Patient was last seen by Primary Care Provider on 11/28/16.   Patient reports adherence with medications.  Current diabetes medications include: Lantus 30 units daily, metformin 1000 mg BID, glimepiride 4 mg daily.  Current hypertension medications include: losartan 100 mg daily.  Patient reports hypoglycemic events. He had a reading of 70 and 80 and these occurred when he did not eat.  Patient reported dietary habits: Eats 2 meals/day. Doesn't eat breakfast. Trying to avoid bread.  Patient reported exercise habits: walks at work, might get into martial arts.   Patient reports nocturia.  Patient reports neuropathy. Patient reports visual changes - blurry vision with high CBGs. Patient denies self foot exams.    O:  Physical Exam   ROS   Lab Results  Component Value Date   HGBA1C 12.6 11/28/2016   There were no vitals filed for this visit.  Home fasting CBG: 200s-300s 2 hour post-prandial/random CBG: 200s-300s  A/P: Diabetes longstanding currently uncontrolled based on A1c of 12.6 and home CBGs. Patient reports hypoglycemic events and is able to verbalize appropriate hypoglycemia management plan. Patient reports adherence with medication. Control is suboptimal due to dietary indiscretion, sedentary lifestyle, and poor medical follow up/adherence.  Increase Lantus to 35 units - will be more conservative in the dose increase due to recent hypoglycemia. Encouraged patient to eat regular meals.   Next A1C anticipated May 2018.     Patient would like a referral for erectile dysfunction - encouraged patient to follow up with Dr. Armen Pickup for this in 2 weeks.  Written patient instructions provided.  Total time in face to face counseling 20  minutes.   Follow up in Pharmacist Clinic Visit PRN, next visit with Dr. Armen Pickup.   Patient seen with Cheral Bay, PharmD Candidate

## 2017-02-06 ENCOUNTER — Encounter (HOSPITAL_COMMUNITY): Payer: Self-pay | Admitting: Emergency Medicine

## 2017-02-06 ENCOUNTER — Emergency Department (HOSPITAL_COMMUNITY)
Admission: EM | Admit: 2017-02-06 | Discharge: 2017-02-06 | Disposition: A | Discharge status: Home / Self Care | Payer: BLUE CROSS/BLUE SHIELD | Attending: Emergency Medicine | Admitting: Emergency Medicine

## 2017-02-06 DIAGNOSIS — Z7982 Long term (current) use of aspirin: Secondary | ICD-10-CM | POA: Insufficient documentation

## 2017-02-06 DIAGNOSIS — R202 Paresthesia of skin: Secondary | ICD-10-CM | POA: Diagnosis present

## 2017-02-06 DIAGNOSIS — I10 Essential (primary) hypertension: Secondary | ICD-10-CM | POA: Insufficient documentation

## 2017-02-06 DIAGNOSIS — E119 Type 2 diabetes mellitus without complications: Secondary | ICD-10-CM | POA: Insufficient documentation

## 2017-02-06 DIAGNOSIS — Z794 Long term (current) use of insulin: Secondary | ICD-10-CM | POA: Diagnosis not present

## 2017-02-06 LAB — CBC WITH DIFFERENTIAL/PLATELET
Basophils Absolute: 0 10*3/uL (ref 0.0–0.1)
Basophils Relative: 0 %
EOS PCT: 1 %
Eosinophils Absolute: 0.1 10*3/uL (ref 0.0–0.7)
HCT: 39.8 % (ref 39.0–52.0)
Hemoglobin: 13.8 g/dL (ref 13.0–17.0)
LYMPHS ABS: 2.3 10*3/uL (ref 0.7–4.0)
LYMPHS PCT: 52 %
MCH: 31.7 pg (ref 26.0–34.0)
MCHC: 34.7 g/dL (ref 30.0–36.0)
MCV: 91.5 fL (ref 78.0–100.0)
MONO ABS: 0.3 10*3/uL (ref 0.1–1.0)
Monocytes Relative: 8 %
NEUTROS ABS: 1.7 10*3/uL (ref 1.7–7.7)
Neutrophils Relative %: 39 %
Platelets: 241 10*3/uL (ref 150–400)
RBC: 4.35 MIL/uL (ref 4.22–5.81)
RDW: 12.8 % (ref 11.5–15.5)
WBC: 4.5 10*3/uL (ref 4.0–10.5)

## 2017-02-06 LAB — URINALYSIS, ROUTINE W REFLEX MICROSCOPIC
Bacteria, UA: NONE SEEN
Bilirubin Urine: NEGATIVE
Glucose, UA: >=500 mg/dL — AB
HGB URINE DIPSTICK: NEGATIVE
KETONES UR: NEGATIVE mg/dL
LEUKOCYTES UA: NEGATIVE
Nitrite: NEGATIVE
PROTEIN: NEGATIVE mg/dL
SQUAMOUS EPITHELIAL / LPF: NONE SEEN
Specific Gravity, Urine: 1.029 (ref 1.005–1.030)
pH: 7 (ref 5.0–8.0)

## 2017-02-06 LAB — I-STAT TROPONIN, ED: Troponin i, poc: 0 ng/mL (ref 0.00–0.08)

## 2017-02-06 LAB — COMPREHENSIVE METABOLIC PANEL
ALBUMIN: 3.6 g/dL (ref 3.5–5.0)
ALT: 52 U/L (ref 17–63)
AST: 51 U/L — AB (ref 15–41)
Alkaline Phosphatase: 66 U/L (ref 38–126)
Anion gap: 11 (ref 5–15)
BUN: 11 mg/dL (ref 6–20)
CHLORIDE: 100 mmol/L — AB (ref 101–111)
CO2: 25 mmol/L (ref 22–32)
CREATININE: 0.91 mg/dL (ref 0.61–1.24)
Calcium: 9.3 mg/dL (ref 8.9–10.3)
GFR calc non Af Amer: >60 mL/min (ref >60)
Glucose, Bld: 411 mg/dL — ABNORMAL HIGH (ref 65–99)
Potassium: 4.2 mmol/L (ref 3.5–5.1)
SODIUM: 136 mmol/L (ref 135–145)
Total Bilirubin: 0.6 mg/dL (ref 0.3–1.2)
Total Protein: 6.7 g/dL (ref 6.5–8.1)

## 2017-02-06 LAB — CBG MONITORING, ED: Glucose-Capillary: 296 mg/dL — ABNORMAL HIGH (ref 65–99)

## 2017-02-06 MED ORDER — INSULIN ASPART 100 UNIT/ML ~~LOC~~ SOLN
5.0000 [IU] | Freq: Once | SUBCUTANEOUS | Status: AC
  Administered 2017-02-06: 5 [IU] via SUBCUTANEOUS
  Filled 2017-02-06: qty 1

## 2017-02-06 MED ORDER — SODIUM CHLORIDE 0.9 % IV BOLUS (SEPSIS)
1000.0000 mL | Freq: Once | INTRAVENOUS | Status: AC
  Administered 2017-02-06: 1000 mL via INTRAVENOUS

## 2017-02-06 NOTE — Discharge Instructions (Signed)
Follow up with your PCP.  Your neurologist should call you.

## 2017-02-06 NOTE — ED Triage Notes (Addendum)
Pt. reports left arm tingling onset yesterday morning , denies chest pain , no SOB , pt. added left foot swelling with tingling , denies injury / ambulatory . Pt. did not take his medications for hypertension / diabetes last night .

## 2017-02-06 NOTE — ED Provider Notes (Signed)
[facility] DEPT Provider Note   CSN: [id number] Arrival date & time: 02/06/17  0524     History   Chief Complaint Chief Complaint  Patient presents with  . Left Arm Tingling    HPI Albert Garcia is a 46 y.o. male.  46 yo M with a chief complaint of left arm and left leg paresthesias. Going on for the past couple days. He feels that they are worsening. Nothing seems to make it better or worse. Denies neck pain headache fever. Denies trauma to the head or neck. He feels that the symptoms go from his shoulder all the way down his leg. Denies trunk involvement.   The history is provided by the patient.  Illness  This is a new problem. The current episode started 2 days ago. The problem occurs constantly. The problem has not changed since onset.Pertinent negatives include no chest pain, no abdominal pain, no headaches and no shortness of breath. Nothing aggravates the symptoms. Nothing relieves the symptoms. He has tried nothing for the symptoms. The treatment provided no relief.    Past Medical History:  Diagnosis Date  . Diabetes mellitus   . Erectile dysfunction   . Headache   . Hypertension   . Seizures [name])     Patient Active Problem List   Diagnosis Date Noted  . Vitamin D insufficiency 11/30/2016  . 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 Long-term use of high-risk medication 12/27/2015  . Microalbuminuric diabetic nephropathy ([place]) 11/15/2015  . Hyperlipidemia associated with type 2 diabetes mellitus ([place]) 11/15/2015  . Erectile dysfunction 11/13/2015  . Muscle cramps 11/13/2015  . Type 2 diabetes mellitus, uncontrolled ([place]) 09/06/2007  . OBESITY 09/06/2007  . HTN (hypertension) 09/06/2007  . Seizure disorder ([place]) 09/06/2007    Past Surgical History:  Procedure Laterality Date  . NO PAST SURGERIES         Home Medications    Prior to Admission medications   Medication Sig Start Date End Date Taking? Authorizing Provider    acetaminophen (ARTHRITIS PAIN RELIEF) 650 MG CR tablet Take 650 mg by mouth every 8 (eight) hours as needed for pain. Reported on 11/13/2015   Yes Historical Provider, MD  aspirin 325 MG EC tablet Take 325 mg by mouth daily.   Yes Historical Provider, MD  atorvastatin (LIPITOR) 40 MG tablet Take 1 tablet (40 mg total) by mouth daily. 11/28/16  Yes Josalyn Funches, MD  Blood Glucose Monitoring Suppl (TRUE METRIX METER) w/Device KIT 1 each by Does not apply route as needed. 11/28/16  Yes Josalyn Funches, MD  glimepiride (AMARYL) 4 MG tablet Take 1 tablet (4 mg total) by mouth daily with breakfast. 11/28/16  Yes Josalyn Funches, MD  glucose blood (TRUE METRIX BLOOD GLUCOSE TEST) test strip 1 each by Other route 3 (three) times daily. 11/28/16  Yes Josalyn Funches, MD  Insulin Glargine (LANTUS SOLOSTAR) 100 UNIT/ML Solostar Pen Inject 35 Units into the skin at bedtime. 02/04/17  Yes Tresa Garter, MD  losartan (COZAAR) 100 MG tablet Take 1 tablet (100 mg total) by mouth daily. 11/28/16  Yes Josalyn Funches, MD  metFORMIN (GLUCOPHAGE) 1000 MG tablet TAKE 1 TABLET BY MOUTH 2 TIMES DAILY WITH A MEAL. 11/28/16  Yes Josalyn Funches, MD  metoCLOPramide (REGLAN) 5 MG tablet Take 1 tablet (5 mg total) by mouth 3 (three) times daily before meals. 11/28/16  Yes Boykin Nearing, MD  Multiple Vitamin (MULTIVITAMIN WITH MINERALS) TABS tablet Take 1 tablet by mouth daily. Reported on 11/13/2015  Yes Historical Provider, MD  phenytoin (DILANTIN) 100 MG ER capsule Take 3 capsules (300 mg total) by mouth daily. Take 3 capsules once a day 11/28/16  Yes Josalyn Funches, MD  TRUEPLUS LANCETS 28G MISC 1 each by Does not apply route 3 (three) times daily. 11/28/16  Yes Boykin Nearing, MD  Vitamin D, Ergocalciferol, (DRISDOL) 50000 units CAPS capsule Take 1 capsule (50,000 Units total) by mouth every 30 (thirty) days. 11/30/16  Yes Boykin Nearing, MD    Family History Family History  Problem Relation Age of Onset  . Hypertension Mother    . Diabetes Mother   . Lupus Mother   . Kidney failure Mother   . Diabetes Father   . Hypertension Father   . Kidney failure Father     Social History Social History  Substance Use Topics  . Smoking status: Never Smoker  . Smokeless tobacco: Never Used  . Alcohol use 0.0 oz/week     Comment: Occasionally throughout year     Allergies   Patient has no known allergies.   Review of Systems Review of Systems  Constitutional: Negative for chills and fever.  HENT: Negative for congestion and facial swelling.   Eyes: Negative for discharge and visual disturbance.  Respiratory: Negative for shortness of breath.   Cardiovascular: Negative for chest pain and palpitations.  Gastrointestinal: Negative for abdominal pain, diarrhea and vomiting.  Musculoskeletal: Negative for arthralgias and myalgias.  Skin: Negative for color change and rash.  Neurological: Positive for numbness (tingling to LUE and LLE). Negative for tremors, syncope and headaches.  Psychiatric/Behavioral: Negative for confusion and dysphoric mood.     Physical Exam Updated Vital Signs BP (!) 170/119   Pulse 90   Temp 98.7 F (37.1 C)   Resp 11   Ht '5\' 8"'  (1.727 m)   Wt 260 lb (117.9 kg)   SpO2 95%   BMI 39.53 kg/m   Physical Exam  Constitutional: He is oriented to person, place, and time. He appears well-developed and well-nourished.  HENT:  Head: Normocephalic and atraumatic.  Eyes: EOM are normal. Pupils are equal, round, and reactive to light.  Neck: Normal range of motion. Neck supple. No JVD present.  Cardiovascular: Normal rate and regular rhythm.  Exam reveals no gallop and no friction rub.   No murmur heard. Pulmonary/Chest: No respiratory distress. He has no wheezes.  Abdominal: He exhibits no distension. There is no rebound and no guarding.  Musculoskeletal: Normal range of motion.  Neurological: He is alert and oriented to person, place, and time. He has normal strength. No cranial nerve  deficit or sensory deficit. He displays a negative Romberg sign. Coordination and gait normal. He displays no Babinski's sign on the right side. He displays no Babinski's sign on the left side.  Intact PMS to all distal nerve distributions to LUE, and LLE  Skin: No rash noted. No pallor.  Psychiatric: He has a normal mood and affect. His behavior is normal.  Nursing note and vitals reviewed.    ED Treatments / Results  Labs (all labs ordered are listed, but only abnormal results are displayed) Labs Reviewed  COMPREHENSIVE METABOLIC PANEL - Abnormal; Notable for the following:       Result Value   Chloride 100 (*)    Glucose, Bld 411 (*)    AST 51 (*)    All other components within normal limits  URINALYSIS, ROUTINE W REFLEX MICROSCOPIC - Abnormal; Notable for the following:    Color, Urine STRAW (*)  Glucose, UA >=500 (*)    All other components within normal limits  CBG MONITORING, ED - Abnormal; Notable for the following:    Glucose-Capillary 296 (*)    All other components within normal limits  CBC WITH DIFFERENTIAL/PLATELET  Randolm Idol, ED    EKG  EKG Interpretation  Date/Time:  Friday February 06 2017 05:57:07 EDT Ventricular Rate:  100 PR Interval:    QRS Duration: 94 QT Interval:  353 QTC Calculation: 456 R Axis:   82 Text Interpretation:  Sinus tachycardia Confirmed by [facility]  MD, Emmaline Kluver ([id number]) on 02/06/2017 6:16:44 AM Also confirmed by [facility]  MD, APRIL ([id number]), editor [place] CT, Leda Gauze [phone])  on 02/06/2017 7:10:26 AM       Radiology No results found.  Procedures Procedures (including critical care time)  Medications Ordered in ED Medications  sodium chloride 0.9 % bolus 1,000 mL (0 mLs Intravenous Stopped 02/06/17 0923)  insulin aspart (novoLOG) injection 5 Units (5 Units Subcutaneous Given 02/06/17 0802)     Initial Impression / Assessment and Plan / ED Course  I have reviewed the triage vital signs and the nursing  notes.  Pertinent labs & imaging results that were available during my care of the patient were reviewed by me and considered in my medical decision making (see chart for details).     46 yo M with paresthesias to LUE and LLE.  No focal findings on neuro exam.  Blood sugar almost 500.  Patient has not been taking his novalog due to it making him gain weight.  Think likely this is cause of paresthesias.  Will have him follow up with neuro as outpatient.  PCP follow up for blood sugar management, and HTN.   10:16 AM:  I have discussed the diagnosis/risks/treatment options with the patient and family and believe the pt to be eligible for discharge home to follow-up with PCP, neuro. We also discussed returning to the ED immediately if new or worsening sx occur. We discussed the sx which are most concerning (e.g., sudden worsening pain, fever, inability to tolerate by mouth) that necessitate immediate return. Medications administered to the patient during their visit and any new prescriptions provided to the patient are listed below.  Medications given during this visit Medications  sodium chloride 0.9 % bolus 1,000 mL (0 mLs Intravenous Stopped 02/06/17 0923)  insulin aspart (novoLOG) injection 5 Units (5 Units Subcutaneous Given 02/06/17 0802)     The patient appears reasonably screen and/or stabilized for discharge and I doubt any other medical condition or other [facility] requiring further screening, evaluation, or treatment in the ED at this time prior to discharge.    Final Clinical Impressions(s) / ED Diagnoses   Final diagnoses:  Paresthesias    New Prescriptions Discharge Medication List as of 02/06/2017  7:58 AM       Deno Etienne, DO 02/06/17 1017

## 2017-02-06 NOTE — ED Notes (Signed)
MD at bedside. 

## 2017-02-25 ENCOUNTER — Ambulatory Visit: Payer: BLUE CROSS/BLUE SHIELD | Attending: Family Medicine | Admitting: Pharmacist

## 2017-02-25 DIAGNOSIS — Z79899 Other long term (current) drug therapy: Secondary | ICD-10-CM

## 2017-02-25 DIAGNOSIS — E1165 Type 2 diabetes mellitus with hyperglycemia: Secondary | ICD-10-CM | POA: Diagnosis not present

## 2017-02-25 DIAGNOSIS — Z794 Long term (current) use of insulin: Secondary | ICD-10-CM

## 2017-02-25 DIAGNOSIS — IMO0002 Reserved for concepts with insufficient information to code with codable children: Secondary | ICD-10-CM

## 2017-02-25 DIAGNOSIS — E118 Type 2 diabetes mellitus with unspecified complications: Secondary | ICD-10-CM

## 2017-02-25 MED ORDER — INSULIN GLARGINE 100 UNIT/ML SOLOSTAR PEN: 40.0000 [IU] | PEN_INJECTOR | Freq: Every day | SUBCUTANEOUS | 5 refills | Status: DC

## 2017-02-25 MED ORDER — INSULIN PEN NEEDLE 31G X 8 MM MISC: 2 refills | Status: DC

## 2017-02-25 MED FILL — LANTUS SOLOSTAR 100 UNITS/M: 100 | 28 days supply | Qty: 15 | Fill #0

## 2017-02-25 MED FILL — TRUEPLUS PEN NDL 31GX5/16: 31G X 8 MM | 30 days supply | Qty: 100 | Fill #0

## 2017-02-25 MED FILL — TRUEPLUS PEN NDL 31GX5/16": 31G X 8 MM | 30 days supply | Qty: 100 | Fill #0

## 2017-02-25 NOTE — Progress Notes (Signed)
    S:     Chief Complaint  Patient presents with  . Medication Management    Patient arrives in good spirits.  Presents for diabetes evaluation, education, and management at the request of Dr. Armen Pickup. Patient was referred on 11/28/16.  Patient was last seen by Primary Care Provider on 11/28/16.   Patient reports adherence with medications.  Current diabetes medications include: Lantus 30 units daily, metformin 1000 mg BID, glimepiride 4 mg daily.  Patient denies hypoglycemic events but reports relative hpoyglycemia with a reading of 87. It woke him up out of his sleep and he ate something to feel better.  Patient reports that he only gets 2 doses out of his Lantus pen and that the needles for the pen aren't long enough to break his skin.  Patient reported dietary habits: Eats 2 meals/day. Doesn't eat breakfast. Trying to avoid bread.  Patient reported exercise habits: walks at work, might get into martial arts.   Patient reports nocturia.  Patient reports neuropathy. Patient reports visual changes - blurry vision with high CBGs. Patient denies self foot exams.   Patient also wants to know if he should be getting more vitamin D or taking the capsules he has more frequently.    O:  Physical Exam   ROS   Lab Results  Component Value Date   HGBA1C 12.6 11/28/2016   There were no vitals filed for this visit.  Home fasting CBG: 200s-300s 2 hour post-prandial/random CBG: 200s-300s  A/P: Diabetes longstanding currently uncontrolled based on A1c of 12.6 and home CBGs. Patient reports hypoglycemic events and is able to verbalize appropriate hypoglycemia management plan. Patient reports adherence with medication. Control is suboptimal due to dietary indiscretion, sedentary lifestyle, and poor medical follow up/adherence.  Increase Lantus to 40 units - will be more conservative in the dose increase due to relative hypoglycemia. Plan to slowly increase insulin dose. Re-educated  patient on Lantus pen use and that a pen should last him a week or so and ordered longer pen needles.   Patient to follow up with Dr. Armen Pickup concerning Vitamin D.   Next A1C anticipated May 2018.     Written patient instructions provided.  Total time in face to face counseling 20 minutes.   Follow up in Pharmacist Clinic Visit PRN, next visit with Dr. Armen Pickup.   Patient seen with Susy Frizzle, PharmD Candidate

## 2017-02-25 NOTE — Patient Instructions (Addendum)
Thanks for coming to see [place]!  Increase Lantus to 40 units daily.  I ordered the longer pen needles  Follow up with Dr. Adrian Blackwater in 2 weeks for diabetes    Hypoglycemia  Hypoglycemia is when the sugar (glucose) level in the blood is too low. Symptoms of low blood sugar may include:  Feeling:  Hungry.  Worried or nervous (anxious).  Sweaty and clammy.  Confused.  Dizzy.  Sleepy.  Sick to your stomach (nauseous).  Having:  A fast heartbeat.  A headache.  A change in your vision.  Jerky movements that you cannot control (seizure).  Nightmares.  Tingling or no feeling (numbness) around the mouth, lips, or tongue.  Having trouble with:  Talking.  Paying attention (concentrating).  Moving (coordination).  Sleeping.  Shaking.  Passing out (fainting).  Getting upset easily (irritability). Low blood sugar can happen to people who have diabetes and people who do not have diabetes. Low blood sugar can happen quickly, and it can be an emergency. Treating Low Blood Sugar  Low blood sugar is often treated by eating or drinking something sugary right away. If you can think clearly and swallow safely, follow the 15:15 rule:  Take 15 grams of a fast-acting carb (carbohydrate). Some fast-acting carbs are:  1 tube of glucose gel.  3 sugar tablets (glucose pills).  6-8 pieces of hard candy.  4 oz (120 mL) of fruit juice.  4 oz (120 mL) of regular (not diet) soda.  Check your blood sugar 15 minutes after you take the carb.  If your blood sugar is still at or below 70 mg/dL (3.9 mmol/L), take 15 grams of a carb again.  If your blood sugar does not go above 70 mg/dL (3.9 mmol/L) after 3 tries, get help right away.  After your blood sugar goes back to normal, eat a meal or a snack within 1 hour. Treating Very Low Blood Sugar  If your blood sugar is at or below 54 mg/dL (3 mmol/L), you have very low blood sugar (severe hypoglycemia). This is an emergency. Do not  wait to see if the symptoms will go away. Get medical help right away. Call your local emergency services (911 in the [place] Do not drive yourself to the hospital. If you have very low blood sugar and you cannot eat or drink, you may need a glucagon shot (injection). A family member or friend should learn how to check your blood sugar and how to give you a glucagon shot. Ask your doctor if you need to have a glucagon shot kit at home. Follow these instructions at home: General instructions   Avoid any diets that cause you to not eat enough food. Talk with your doctor before you start any new diet.  Take over-the-counter and prescription medicines only as told by your doctor.  Limit alcohol to no more than 1 drink per day for nonpregnant women and 2 drinks per day for men. One drink equals 12 oz of beer, 5 oz of wine, or 1 oz of hard liquor.  Keep all follow-up visits as told by your doctor. This is important. If You Have Diabetes:    Make sure you know the symptoms of low blood sugar.  Always keep a source of sugar with you, such as:  Sugar.  Sugar tablets.  Glucose gel.  Fruit juice.  Regular soda (not diet soda).  Milk.  Hard candy.  Honey.  Take your medicines as told.  Follow your exercise and meal plan.  Eat on time. Do not skip meals.  Follow your sick day plan when you cannot eat or drink normally. Make this plan ahead of time with your doctor.  Check your blood sugar as often as told by your doctor. Always check before and after exercise.  Share your diabetes care plan with:  Your work or school.  People you live with.  Check your pee (urine) for ketones:  When you are sick.  As told by your doctor.  Carry a card or wear jewelry that says you have diabetes. If You Have Low Blood Sugar From Other Causes:    Check your blood sugar as often as told by your doctor.  Follow instructions from your doctor about what you cannot eat or drink. Contact a  doctor if:  You have trouble keeping your blood sugar in your target range.  You have low blood sugar often. Get help right away if:  You still have symptoms after you eat or drink something sugary.  Your blood sugar is at or below 54 mg/dL (3 mmol/L).  You have jerky movements that you cannot control.  You pass out. These symptoms may be an emergency. Do not wait to see if the symptoms will go away. Get medical help right away. Call your local emergency services (911 in the [place] Do not drive yourself to the hospital. This information is not intended to replace advice given to you by your health care provider. Make sure you discuss any questions you have with your health care provider. Document Released: 01/07/2010 Document Revised: 03/20/2016 Document Reviewed: 11/16/2015 Elsevier Interactive Patient Education  2017 [place] for Diabetes Mellitus, Adult Carbohydrate counting is a method for keeping track of how many carbohydrates you eat. Eating carbohydrates naturally increases the amount of sugar (glucose) in the blood. Counting how many carbohydrates you eat helps keep your blood glucose within normal limits, which helps you manage your diabetes (diabetes mellitus). It is important to know how many carbohydrates you can safely have in each meal. This is different for every person. A diet and nutrition specialist (registered dietitian) can help you make a meal plan and calculate how many carbohydrates you should have at each meal and snack. Carbohydrates are found in the following foods:  Grains, such as breads and cereals.  Dried beans and soy products.  Starchy vegetables, such as potatoes, peas, and corn.  Fruit and fruit juices.  Milk and yogurt.  Sweets and snack foods, such as cake, cookies, candy, chips, and soft drinks. How do I count carbohydrates? There are two ways to count carbohydrates in food. You can use either of the methods or a  combination of both. Reading "Nutrition Facts" on packaged food  The "Nutrition Facts" list is included on the labels of almost all packaged foods and beverages in the [place] It includes:  The serving size.  Information about nutrients in each serving, including the grams (g) of carbohydrate per serving. To use the "Nutrition Facts":  Decide how many servings you will have.  Multiply the number of servings by the number of carbohydrates per serving.  The resulting number is the total amount of carbohydrates that you will be having. Learning standard serving sizes of other foods  When you eat foods containing carbohydrates that are not packaged or do not include "Nutrition Facts" on the label, you need to measure the servings in order to count the amount of carbohydrates:  Measure the foods that you will eat  with a food scale or measuring cup, if needed.  Decide how many standard-size servings you will eat.  Multiply the number of servings by 15. Most carbohydrate-rich foods have about 15 g of carbohydrates per serving.  For example, if you eat 8 oz (170 g) of strawberries, you will have eaten 2 servings and 30 g of carbohydrates (2 servings x 15 g = 30 g).  For foods that have more than one food mixed, such as soups and casseroles, you must count the carbohydrates in each food that is included. The following list contains standard serving sizes of common carbohydrate-rich foods. Each of these servings has about 15 g of carbohydrates:   hamburger bun or  English muffin.   oz (15 mL) syrup.   oz (14 g) jelly.  1 slice of bread.  1 six-inch tortilla.  3 oz (85 g) cooked rice or pasta.  4 oz (113 g) cooked dried beans.  4 oz (113 g) starchy vegetable, such as peas, corn, or potatoes.  4 oz (113 g) hot cereal.  4 oz (113 g) mashed potatoes or  of a large baked potato.  4 oz (113 g) canned or frozen fruit.  4 oz (120 mL) fruit juice.  4-6 crackers.  6 chicken  nuggets.  6 oz (170 g) unsweetened dry cereal.  6 oz (170 g) plain fat-free yogurt or yogurt sweetened with artificial sweeteners.  8 oz (240 mL) milk.  8 oz (170 g) fresh fruit or one small piece of fruit.  24 oz (680 g) popped popcorn. Example of carbohydrate counting Sample meal   3 oz (85 g) chicken breast.  6 oz (170 g) brown rice.  4 oz (113 g) corn.  8 oz (240 mL) milk.  8 oz (170 g) strawberries with sugar-free whipped topping. Carbohydrate calculation  1. Identify the foods that contain carbohydrates:  Rice.  Corn.  Milk.  Strawberries. 2. Calculate how many servings you have of each food:  2 servings rice.  1 serving corn.  1 serving milk.  1 serving strawberries. 3. Multiply each number of servings by 15 g:  2 servings rice x 15 g = 30 g.  1 serving corn x 15 g = 15 g.  1 serving milk x 15 g = 15 g.  1 serving strawberries x 15 g = 15 g. 4. Add together all of the amounts to find the total grams of carbohydrates eaten:  30 g + 15 g + 15 g + 15 g = 75 g of carbohydrates total. This information is not intended to replace advice given to you by your health care provider. Make sure you discuss any questions you have with your health care provider. Document Released: 10/13/2005 Document Revised: 05/02/2016 Document Reviewed: 03/26/2016 Elsevier Interactive Patient Education  2017 [employer].

## 2017-02-27 NOTE — Telephone Encounter (Signed)
Certified dismissal letter returned as undeliverable, unclaimed, return to sender after three attempts by [employer] on Feb 27, 2017. Letter placed in another envelope and resent as 1st class mail which does not require a signature. DAJ

## 2017-03-03 MED FILL — VIT D2 1.25 MG (50,000 UNIT: 1.25 MG | 29 days supply | Qty: 1 | Fill #2

## 2017-03-03 MED FILL — METOCLOPRAMIDE 5 MG TABLET: 5 | 30 days supply | Qty: 90 | Fill #1

## 2017-03-10 MED FILL — metFORMIN HCL 1000 MG TABS: 1000 | 30 days supply | Qty: 60 | Fill #2

## 2017-03-10 MED FILL — ATORVASTATIN 40 MG TABLET: 40 | 30 days supply | Qty: 30 | Fill #2

## 2017-03-10 MED FILL — GLIMEPIRIDE 4 MG TABLET: 4 | 30 days supply | Qty: 30 | Fill #2

## 2017-03-11 ENCOUNTER — Encounter: Payer: Self-pay | Admitting: Family Medicine
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
## 2017-03-11 NOTE — Progress Notes (Deleted)
Subjective:  Patient ID: Albert Garcia, male    DOB: February 05, 1971  Age: 46 y.o. MRN: [id number]  CC: Hypertension; Diabetes; and Follow-up   HPI KONSTANTINOS CORDOBA has seizure disorder, HLD,  HTN and diabetes he  presents for    1. CHRONIC DIABETES  Disease Monitoring  Blood Sugar Ranges: not checking   Polyuria: yes   Visual problems: yes   Medication Compliance: yes Medication Side Effects  Hypoglycemia: no   Preventitive Health Care  Eye Exam: due   Foot Exam: done today   Diet pattern: regular meals, no restrictions   Exercise: no   He reports early satiety, reflux, bloating at times. Also tingling, numbness and pain in feet.    2. HTN: compliant with losartan 100 mg daily.  3. ED f/u L arm numbness: he was seen at [facility] [place] on 02/06/17 for L arm numbness. His BP was 170/119 and his CBG was 296 at the time. Troponin was negative. EKG was normal sinus rhythm, HR 100.   4. Seizure disorder: he has been dismissed from [facility] as of 01/13/17 due to violation of the no-show policy. He is compliant with dilantin. Reports his last seizure was   Social History  Substance Use Topics  . Smoking status: Never Smoker  . Smokeless tobacco: Never Used  . Alcohol use 0.0 oz/week     Comment: Occasionally throughout year    Outpatient Medications Prior to Visit  Medication Sig Dispense Refill  . acetaminophen (ARTHRITIS PAIN RELIEF) 650 MG CR tablet Take 650 mg by mouth every 8 (eight) hours as needed for pain. Reported on 11/13/2015    . aspirin 325 MG EC tablet Take 325 mg by mouth daily.    Marland Kitchen atorvastatin (LIPITOR) 40 MG tablet Take 1 tablet (40 mg total) by mouth daily. 30 tablet 5  . Blood Glucose Monitoring Suppl (TRUE METRIX METER) w/Device KIT 1 each by Does not apply route as needed. 1 kit 0  . glimepiride (AMARYL) 4 MG tablet Take 1 tablet (4 mg total) by mouth daily with breakfast. 30 tablet 5  . glucose blood (TRUE METRIX BLOOD GLUCOSE TEST) test strip 1 each  by Other route 3 (three) times daily. 100 each 11  . Insulin Glargine (LANTUS SOLOSTAR) 100 UNIT/ML Solostar Pen Inject 40 Units into the skin at bedtime. 15 mL 5  . Insulin Pen Needle 31G X 8 MM MISC Use as directed 100 each 2  . losartan (COZAAR) 100 MG tablet Take 1 tablet (100 mg total) by mouth daily. 30 tablet 5  . metFORMIN (GLUCOPHAGE) 1000 MG tablet TAKE 1 TABLET BY MOUTH 2 TIMES DAILY WITH A MEAL. 60 tablet 5  . metoCLOPramide (REGLAN) 5 MG tablet Take 1 tablet (5 mg total) by mouth 3 (three) times daily before meals. 90 tablet 2  . Multiple Vitamin (MULTIVITAMIN WITH MINERALS) TABS tablet Take 1 tablet by mouth daily. Reported on 11/13/2015    . phenytoin (DILANTIN) 100 MG ER capsule Take 3 capsules (300 mg total) by mouth daily. Take 3 capsules once a day 90 capsule 3  . TRUEPLUS LANCETS 28G MISC 1 each by Does not apply route 3 (three) times daily. 100 each 11  . Vitamin D, Ergocalciferol, (DRISDOL) 50000 units CAPS capsule Take 1 capsule (50,000 Units total) by mouth every 30 (thirty) days. 4 capsule 3   No facility-administered medications prior to visit.     ROS Review of Systems  Constitutional: Negative for chills, fatigue, fever  and unexpected weight change.  Eyes: Positive for visual disturbance.  Respiratory: Negative for cough and shortness of breath.   Cardiovascular: Negative for chest pain, palpitations and leg swelling.  Gastrointestinal: Negative for abdominal pain, blood in stool, constipation, diarrhea, nausea and vomiting.  Endocrine: Positive for polydipsia and polyuria. Negative for polyphagia.  Musculoskeletal: Negative for arthralgias, back pain, gait problem, myalgias and neck pain.  Skin: Negative for rash.  Allergic/Immunologic: Negative for immunocompromised state.  Hematological: Negative for adenopathy. Does not bruise/bleed easily.  Psychiatric/Behavioral: Negative for dysphoric mood, sleep disturbance and suicidal ideas. The patient is not  nervous/anxious.     Objective:  There were no vitals taken for this visit.  BP/Weight 02/06/2017 11/28/2016 30/94/0768  Systolic BP 088 110 315  Diastolic BP 945 86 89  Wt. (Lbs) 260 254.4 245  BMI 39.53 38.68 37.25    Physical Exam  Constitutional: He appears well-developed and well-nourished. No distress.  HENT:  Head: Normocephalic and atraumatic.  Neck: Normal range of motion. Neck supple.  Cardiovascular: Normal rate, regular rhythm, normal heart sounds and intact distal pulses.   Pulmonary/Chest: Effort normal and breath sounds normal.  Musculoskeletal: He exhibits no edema.  Neurological: He is alert.  Skin: Skin is warm and dry. No rash noted. No erythema.  Psychiatric: He has a normal mood and affect.    Lab Results  Component Value Date   HGBA1C 12.6 11/28/2016   CBG  Assessment & Plan:   There are no diagnoses linked to this encounter.  No orders of the defined types were placed in this encounter.   Follow-up: No Follow-up on file.   Boykin Nearing MD

## 2017-03-12 ENCOUNTER — Ambulatory Visit (HOSPITAL_BASED_OUTPATIENT_CLINIC_OR_DEPARTMENT_OTHER): Payer: BLUE CROSS/BLUE SHIELD | Admitting: Family Medicine

## 2017-03-12 DIAGNOSIS — E118 Type 2 diabetes mellitus with unspecified complications: Secondary | ICD-10-CM

## 2017-03-12 DIAGNOSIS — Z794 Long term (current) use of insulin: Secondary | ICD-10-CM

## 2017-03-12 DIAGNOSIS — E1165 Type 2 diabetes mellitus with hyperglycemia: Secondary | ICD-10-CM

## 2017-03-13 NOTE — Progress Notes (Signed)
Patient ID: Albert LeavensJames A Garcia, male   DOB: 10-17-1971, 46 y.o.   MRN: [id number] Opened in error. [facility]

## 2017-04-13 MED FILL — LOSARTAN POTASSIUM 100 MG T: 100 | 30 days supply | Qty: 30 | Fill #2

## 2017-04-13 MED FILL — PHENYTOIN SOD EXT 100 MG CA: 100 | 30 days supply | Qty: 90 | Fill #2

## 2017-04-13 MED FILL — metFORMIN HCL 1000 MG TABS: 1000 | 30 days supply | Qty: 60 | Fill #3

## 2017-04-13 MED FILL — GLIMEPIRIDE 4 MG TABLET: 4 | 30 days supply | Qty: 30 | Fill #3

## 2017-04-15 MED FILL — VIT D2 1.25 MG (50,000 UNIT: 1.25 MG | 29 days supply | Qty: 1 | Fill #3

## 2017-04-16 ENCOUNTER — Other Ambulatory Visit: Payer: Self-pay

## 2017-04-16 MED ORDER — ATORVASTATIN CALCIUM 20 MG PO TABS: 40.0000 mg | ORAL_TABLET | Freq: Every day | ORAL | 3 refills | Status: DC

## 2017-04-16 MED FILL — ATORVASTATIN 40 MG TABLET: 40 | 30 days supply | Qty: 30 | Fill #3

## 2017-05-01 ENCOUNTER — Encounter: Payer: Self-pay | Admitting: Internal Medicine

## 2017-05-01 ENCOUNTER — Ambulatory Visit: Payer: BLUE CROSS/BLUE SHIELD | Attending: Internal Medicine | Admitting: Internal Medicine

## 2017-05-01 VITALS — BP 137/86 | HR 106 | Temp 98.3°F | Resp 16 | Wt 250.0 lb

## 2017-05-01 DIAGNOSIS — G40409 Other generalized epilepsy and epileptic syndromes, not intractable, without status epilepticus: Secondary | ICD-10-CM | POA: Diagnosis not present

## 2017-05-01 DIAGNOSIS — Z7982 Long term (current) use of aspirin: Secondary | ICD-10-CM | POA: Diagnosis not present

## 2017-05-01 DIAGNOSIS — Z833 Family history of diabetes mellitus: Secondary | ICD-10-CM | POA: Diagnosis not present

## 2017-05-01 DIAGNOSIS — IMO0002 Reserved for concepts with insufficient information to code with codable children: Secondary | ICD-10-CM

## 2017-05-01 DIAGNOSIS — I10 Essential (primary) hypertension: Secondary | ICD-10-CM | POA: Diagnosis not present

## 2017-05-01 DIAGNOSIS — Z6838 Body mass index (BMI) 38.0-38.9, adult: Secondary | ICD-10-CM | POA: Diagnosis not present

## 2017-05-01 DIAGNOSIS — E1165 Type 2 diabetes mellitus with hyperglycemia: Secondary | ICD-10-CM

## 2017-05-01 DIAGNOSIS — Z7984 Long term (current) use of oral hypoglycemic drugs: Secondary | ICD-10-CM | POA: Insufficient documentation

## 2017-05-01 DIAGNOSIS — E669 Obesity, unspecified: Secondary | ICD-10-CM | POA: Diagnosis not present

## 2017-05-01 DIAGNOSIS — Z841 Family history of disorders of kidney and ureter: Secondary | ICD-10-CM | POA: Diagnosis not present

## 2017-05-01 DIAGNOSIS — E559 Vitamin D deficiency, unspecified: Secondary | ICD-10-CM | POA: Insufficient documentation

## 2017-05-01 DIAGNOSIS — E118 Type 2 diabetes mellitus with unspecified complications: Secondary | ICD-10-CM | POA: Diagnosis not present

## 2017-05-01 DIAGNOSIS — Z8249 Family history of ischemic heart disease and other diseases of the circulatory system: Secondary | ICD-10-CM | POA: Insufficient documentation

## 2017-05-01 DIAGNOSIS — Z79899 Other long term (current) drug therapy: Secondary | ICD-10-CM | POA: Diagnosis not present

## 2017-05-01 DIAGNOSIS — E785 Hyperlipidemia, unspecified: Secondary | ICD-10-CM | POA: Diagnosis not present

## 2017-05-01 DIAGNOSIS — E119 Type 2 diabetes mellitus without complications: Secondary | ICD-10-CM | POA: Diagnosis present

## 2017-05-01 DIAGNOSIS — G40909 Epilepsy, unspecified, not intractable, without status epilepticus: Secondary | ICD-10-CM

## 2017-05-01 DIAGNOSIS — E1121 Type 2 diabetes mellitus with diabetic nephropathy: Secondary | ICD-10-CM | POA: Insufficient documentation

## 2017-05-01 DIAGNOSIS — Z794 Long term (current) use of insulin: Secondary | ICD-10-CM | POA: Diagnosis not present

## 2017-05-01 LAB — POCT URINALYSIS DIPSTICK
BILIRUBIN UA: NEGATIVE
GLUCOSE UA: 500
KETONES UA: NEGATIVE
LEUKOCYTES UA: NEGATIVE
Nitrite, UA: NEGATIVE
Protein, UA: NEGATIVE
Spec Grav, UA: <=1.005 — AB (ref 1.010–1.025)
Urobilinogen, UA: 0.2 E.U./dL
pH, UA: 5.5 (ref 5.0–8.0)

## 2017-05-01 LAB — GLUCOSE, POCT (MANUAL RESULT ENTRY)
POC Glucose: 463 mg/dl — AB (ref 70–99)
POC Glucose: 384 mg/dl — AB (ref 70–99)

## 2017-05-01 LAB — POCT GLYCOSYLATED HEMOGLOBIN (HGB A1C): Hemoglobin A1C: 11.1

## 2017-05-01 MED ORDER — INSULIN LISPRO 100 UNIT/ML (KWIKPEN): PEN_INJECTOR | SUBCUTANEOUS | 11 refills | Status: DC

## 2017-05-01 MED ORDER — INSULIN ASPART 100 UNIT/ML ~~LOC~~ SOLN
10.0000 [IU] | Freq: Once | SUBCUTANEOUS | Status: AC
  Administered 2017-05-01: 10 [IU] via SUBCUTANEOUS

## 2017-05-01 MED ORDER — INSULIN GLARGINE 100 UNIT/ML SOLOSTAR PEN
45.0000 [IU] | PEN_INJECTOR | Freq: Every day | SUBCUTANEOUS | 5 refills | Status: DC
Start: 2017-05-01 — End: 2017-07-31

## 2017-05-01 MED ORDER — INSULIN PEN NEEDLE 31G X 8 MM MISC: 2 refills | Status: DC

## 2017-05-01 MED FILL — LANTUS SOLOSTAR 100 UNITS/M: 100 | 28 days supply | Qty: 15 | Fill #1

## 2017-05-01 MED FILL — HUMALOG 100 UNITS/ML KWIKPE: 100 | 25 days supply | Qty: 6 | Fill #0

## 2017-05-01 NOTE — Progress Notes (Signed)
Patient ID: CHAZE HRUSKA, male    DOB: 1971-03-10  MRN: [id number]  CC: re-establish and Diabetes   Subjective: Albert Garcia is a 46 y.o. male who presents to re-est care.  PCP was Dr. Adrian Blackwater His concerns today include:  Hx of DM, HTN, Sz, obesity and HL  1DM: -did not take Metformin, Amaryl or Lantus as yet for today and has not eaten -checks BS daily.  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  No blurred vision.   2.  HTN -compliant with Losartan -limits salt  No CP/SOB  3. SZ:  Last sz over 10 yrs ago. -compliant with Dilantin  Patient Active Problem List   Diagnosis Date Noted  . Vitamin D insufficiency 11/30/2016  . 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 Long-term use of high-risk medication 12/27/2015  . Microalbuminuric diabetic nephropathy ([place]) 11/15/2015  . Hyperlipidemia associated with type 2 diabetes mellitus ([place]) 11/15/2015  . Erectile dysfunction 11/13/2015  . Muscle cramps 11/13/2015  . Type 2 diabetes mellitus, uncontrolled ([place]) 09/06/2007  . OBESITY 09/06/2007  . HTN (hypertension) 09/06/2007  . Seizure disorder ([place]) 09/06/2007     Current Outpatient Prescriptions on File Prior to Visit  Medication Sig Dispense Refill  . acetaminophen (ARTHRITIS PAIN RELIEF) 650 MG CR tablet Take 650 mg by mouth every 8 (eight) hours as needed for pain. Reported on 11/13/2015    . aspirin 325 MG EC tablet Take 325 mg by mouth daily.    Marland Kitchen atorvastatin (LIPITOR) 20 MG tablet Take 2 tablets (40 mg total) by mouth daily. (Patient not taking: Reported on 05/01/2017) 90 tablet 3  . atorvastatin (LIPITOR) 40 MG tablet Take 1 tablet (40 mg total) by mouth daily. 30 tablet 5  . Blood Glucose Monitoring Suppl (TRUE METRIX METER) w/Device KIT 1 each by Does not apply route as needed. 1 kit 0  . glimepiride (AMARYL) 4 MG tablet Take 1 tablet (4 mg total)  by mouth daily with breakfast. 30 tablet 5  . glucose blood (TRUE METRIX BLOOD GLUCOSE TEST) test strip 1 each by Other route 3 (three) times daily. 100 each 11  . losartan (COZAAR) 100 MG tablet Take 1 tablet (100 mg total) by mouth daily. 30 tablet 5  . metFORMIN (GLUCOPHAGE) 1000 MG tablet TAKE 1 TABLET BY MOUTH 2 TIMES DAILY WITH A MEAL. 60 tablet 5  . metoCLOPramide (REGLAN) 5 MG tablet Take 1 tablet (5 mg total) by mouth 3 (three) times daily before meals. 90 tablet 2  . Multiple Vitamin (MULTIVITAMIN WITH MINERALS) TABS tablet Take 1 tablet by mouth daily. Reported on 11/13/2015    . phenytoin (DILANTIN) 100 MG ER capsule Take 3 capsules (300 mg total) by mouth daily. Take 3 capsules once a day 90 capsule 3  . TRUEPLUS LANCETS 28G MISC 1 each by Does not apply route 3 (three) times daily. 100 each 11  . Vitamin D, Ergocalciferol, (DRISDOL) 50000 units CAPS capsule Take 1 capsule (50,000 Units total) by mouth every 30 (thirty) days. 4 capsule 3  . [DISCONTINUED] gemfibrozil (LOPID) 600 MG tablet Take 1 tablet (600 mg total) by mouth 2 (two) times daily before a meal. (Patient not taking: Reported on 06/17/2015) 60 tablet 2  . [DISCONTINUED] quinapril-hydrochlorothiazide (ACCURETIC) 20-12.5 MG per tablet Take 1 tablet by mouth daily. (Patient not taking: Reported on 06/17/2015) 90 tablet 7   No current  facility-administered medications on file prior to visit.     No Known Allergies  Social History   Social History  . Marital status: Legally Separated    Spouse name: N/A  . Number of children: 3  . Years of education: N/A   Occupational History  . Hydrologist   Social History Main Topics  . Smoking status: Never Smoker  . Smokeless tobacco: Never Used  . Alcohol use 0.0 oz/week     Comment: Occasionally throughout year  . Drug use: No  . Sexual activity: No   Other Topics Concern  . Not on file   Social History Narrative  . No narrative on file    Family  History  Problem Relation Age of Onset  . Hypertension Mother   . Diabetes Mother   . Lupus Mother   . Kidney failure Mother   . Diabetes Father   . Hypertension Father   . Kidney failure Father     Past Surgical History:  Procedure Laterality Date  . NO PAST SURGERIES      ROS: Review of Systems As stated above PHYSICAL EXAM: BP 137/86   Pulse (!) 106   Temp 98.3 F (36.8 C) (Oral)   Resp 16   Wt 250 lb (113.4 kg)   SpO2 96%   BMI 38.01 kg/m   Wt Readings from Last 3 Encounters:  05/01/17 250 lb (113.4 kg)  02/06/17 260 lb (117.9 kg)  11/28/16 254 lb 6.4 oz (115.4 kg)    Physical Exam General appearance - alert, well appearing, Middle-age African-American male and in no distress Mental status - alert, oriented to person, place, and time, normal mood, behavior, speech, dress, motor activity, and thought processes Eyes - pupils equal and reactive, extraocular eye movements intact Mouth - mucous membranes moist, pharynx normal without lesions Neck - supple, no significant adenopathy Chest - clear to auscultation, no wheezes, rales or rhonchi, symmetric air entry Heart - normal rate, regular rhythm, normal S1, S2, no murmurs, rubs, clicks or gallops Extremities - peripheral pulses normal, no pedal edema, no clubbing or cyanosis Diabetic Foot Exam - Simple   Simple Foot Form Visual Inspection No deformities, no ulcerations, no other skin breakdown bilaterally:  Yes Sensation Testing Intact to touch and monofilament testing bilaterally:  Yes Pulse Check Posterior Tibialis and Dorsalis pulse intact bilaterally:  Yes Comments     Results for orders placed or performed in visit on 05/01/17  POCT glucose (manual entry)  Result Value Ref Range   POC Glucose 463 (A) 70 - 99 mg/dl  POCT glycosylated hemoglobin (Hb A1C)  Result Value Ref Range   Hemoglobin A1C 11.1   POCT urinalysis dipstick  Result Value Ref Range   Color, UA yellow    Clarity, UA clear     Glucose, UA 500    Bilirubin, UA negative    Ketones, UA negative    Spec Grav, UA <=1.005 (A) 1.010 - 1.025   Blood, UA trace    pH, UA 5.5 5.0 - 8.0   Protein, UA negative    Urobilinogen, UA 0.2 0.2 or 1.0 E.U./dL   Nitrite, UA negative    Leukocytes, UA Negative Negative  POCT glucose (manual entry)  Result Value Ref Range   POC Glucose 384 (A) 70 - 99 mg/dl     Chemistry      Component Value Date/Time   NA 136 02/06/2017 0548   K 4.2 02/06/2017 0548   CL 100 (L) 02/06/2017  0548   CO2 25 02/06/2017 0548   BUN 11 02/06/2017 0548   CREATININE 0.91 02/06/2017 0548   CREATININE 0.81 11/28/2016 1232      Component Value Date/Time   CALCIUM 9.3 02/06/2017 0548   ALKPHOS 66 02/06/2017 0548   AST 51 (H) 02/06/2017 0548   ALT 52 02/06/2017 0548   BILITOT 0.6 02/06/2017 0548      ASSESSMENT AND PLAN: 1. Uncontrolled type 2 diabetes mellitus with complication, with long-term current use of insulin ([place]) Patient have been counseled extensively about nutrition and exercise. Other issues discussed during this visit include: low cholesterol diet, weight control and daily exercise, foot care, annual eye examinations at Ophthalmology, importance of adherence with medications and regular follow-up. We also discussed long term complications of uncontrolled diabetes and hypertension. -Increase Lantus to 45 units daily. Add Humalog 12 units with the 2 largest meals of the day. -Patient to check blood sugars daily and bring in readings in 3 weeks to meet with the clinical pharmacist for further titration of insulin -Patient given 10 units of NovoLog and several cups of water here in the office today - POCT glucose (manual entry) - POCT glycosylated hemoglobin (Hb A1C) - POCT urinalysis dipstick - insulin aspart (novoLOG) injection 10 Units; Inject 0.1 mLs (10 Units total) into the skin once. - POCT glucose (manual entry) - Basic Metabolic Panel - insulin lispro (HUMALOG KWIKPEN) 100  UNIT/ML KiwkPen; 12 units twice a day Subcut with the 2 largest meals of the day  Dispense: 15 mL; Refill: 11 - Insulin Pen Needle 31G X 8 MM MISC; Use as directed  Dispense: 100 each; Refill: 2 - Insulin Glargine (LANTUS SOLOSTAR) 100 UNIT/ML Solostar Pen; Inject 45 Units into the skin at bedtime.  Dispense: 15 mL; Refill: 5 - Ambulatory referral to Ophthalmology  2. Essential hypertension At goal. Continue atorvastatin  3. Seizure disorder ([place]) Controlled. Continue Dilantin  4. Obesity (BMI 35.0-39.9 without comorbidity) See #1 above  5. Hyperlipidemia, unspecified hyperlipidemia type Continue Lipitor.  Patient was given the opportunity to ask questions.  Patient verbalized understanding of the plan and was able to repeat key elements of the plan.   Orders Placed This Encounter  Procedures  . Basic Metabolic Panel  . Ambulatory referral to Ophthalmology  . POCT glucose (manual entry)  . POCT glycosylated hemoglobin (Hb A1C)  . POCT urinalysis dipstick  . POCT glucose (manual entry)     Requested Prescriptions   Signed Prescriptions Disp Refills  . insulin lispro (HUMALOG KWIKPEN) 100 UNIT/ML KiwkPen 15 mL 11    Sig: 12 units twice a day Subcut with the 2 largest meals of the day  . Insulin Pen Needle 31G X 8 MM MISC 100 each 2    Sig: Use as directed  . Insulin Glargine (LANTUS SOLOSTAR) 100 UNIT/ML Solostar Pen 15 mL 5    Sig: Inject 45 Units into the skin at bedtime.    Return in about 2 months (around 07/02/2017).  Karle Plumber, MD, FACP

## 2017-05-01 NOTE — Patient Instructions (Signed)
Give appointment with Kennyth ArnoldStacy in 3 wks  Take Humalog insulin 12 units with the two largest meals of the day.  Follow a Healthy Eating Plan - You can do it! Limit sugary drinks.  Avoid sodas, sweet tea, sport or energy drinks, or fruit drinks.  Drink water, lo-fat milk, or diet drinks. Limit snack foods.   Cut back on candy, cake, cookies, chips, ice cream.  These are a special treat, only in small amounts. Eat plenty of vegetables.  Especially dark green, red, and orange vegetables. Aim for at least 3 servings a day. More is better! Include fruit in your daily diet.  Whole fruit is much healthier than fruit juice! Limit "white" bread, "white" pasta, "white" rice.   Choose "100% whole grain" products, brown or wild rice. Avoid fatty meats. Try "Meatless Monday" and choose eggs or beans one day a week.  When eating meat, choose lean meats like chicken, [place]key, and fish.  Grill, broil, or bake meats instead of frying, and eat poultry without the skin. Eat less salt.  Avoid frozen pizzas, frozen dinners and salty foods.  Use seasonings other than salt in cooking.  This can help blood pressure and keep you from swelling Beer, wine and liquor have calories.  If you can safely drink alcohol, limit to 1 drink per day for women, 2 drinks for men

## 2017-05-02 ENCOUNTER — Telehealth: Payer: Self-pay | Admitting: Internal Medicine

## 2017-05-02 LAB — BASIC METABOLIC PANEL
BUN/Creatinine Ratio: 17 (ref 9–20)
BUN: 15 mg/dL (ref 6–24)
CHLORIDE: 95 mmol/L — AB (ref 96–106)
CO2: 24 mmol/L (ref 20–29)
CREATININE: 0.89 mg/dL (ref 0.76–1.27)
Calcium: 10.4 mg/dL — ABNORMAL HIGH (ref 8.7–10.2)
GFR calc Af Amer: 119 mL/min/{1.73_m2} (ref >59)
GFR calc non Af Amer: 103 mL/min/{1.73_m2} (ref >59)
GLUCOSE: 387 mg/dL — AB (ref 65–99)
POTASSIUM: 4.4 mmol/L (ref 3.5–5.2)
SODIUM: 137 mmol/L (ref 134–144)

## 2017-05-02 NOTE — Telephone Encounter (Signed)
I was able to reach pt this afternoon.  I informed him of my concern with his BMP from yesterday.  He reports taking Novolog again when he got him yesterday and his other oral DM meds.  Took Lantus this a.m and 12 units Novolog.  His BS this a.m was 275. He voices no concerns. I encouraged compliance with meds. Will send message to scheduler to give him a f/u appt with clinical pharmacist in 2 wks for further insulin titration

## 2017-05-02 NOTE — Telephone Encounter (Signed)
BMP reviewed. Pt has anion gap of 18. PC placed to pt several times today. Left message that I was trying to reach him to f/u on BS.  Will try to reach him again later. I also tried to reach his emergency contact listed as sister Elmarie Shileyiffany.  I did not leave a message.

## 2017-05-04 ENCOUNTER — Encounter: Payer: Self-pay | Admitting: Family Medicine

## 2017-05-06 ENCOUNTER — Ambulatory Visit: Payer: Self-pay | Admitting: Pharmacist

## 2017-05-06 NOTE — Progress Notes (Deleted)
    S:     No chief complaint on file.   Patient arrives in good spirits.  Presents for diabetes evaluation, education, and management at the request of Dr. Laural BenesJohnson. Patient was referred on 05/01/17.  Patient was last seen by Primary Care Provider on 05/01/17.   Patient reports adherence with medications.  Current diabetes medications include: Lantus 45 units daily, Novolog 12 units prior to 2 largest meals, metformin 1000 mg BID, glimepiride 4 mg daily.  Patient denies hypoglycemic events   Patient reported dietary habits: Eats 2 meals/day. Doesn't eat breakfast. Trying to avoid bread.  Patient reported exercise habits: walks at work, might get into martial arts.   Patient reports nocturia.  Patient reports neuropathy. Patient reports visual changes - blurry vision with high CBGs. Patient denies self foot exams.   Patient also wants to know if he should be getting more vitamin D or taking the capsules he has more frequently.    O:  Physical Exam   ROS   Lab Results  Component Value Date   HGBA1C 11.1 05/01/2017   There were no vitals filed for this visit.  Home fasting CBG: 200s-300s 2 hour post-prandial/random CBG: 200s-300s  A/P: Diabetes longstanding currently uncontrolled based on A1c of 11.1, down from 12.6 so improving. Patient reports hypoglycemic events and is able to verbalize appropriate hypoglycemia management plan. Patient reports adherence with medication. Control is suboptimal due to dietary indiscretion, sedentary lifestyle, and poor medical follow up/adherence.  Next A1C anticipated May 2018.     Written patient instructions provided.  Total time in face to face counseling 20 minutes.   Follow up in Pharmacist Clinic Visit PRN, next visit with Dr. Laural BenesJohnson

## 2017-05-19 MED FILL — GLIMEPIRIDE 4 MG TABLET: 4 | 30 days supply | Qty: 30 | Fill #4

## 2017-05-19 MED FILL — PHENYTOIN SOD EXT 100 MG CA: 100 | 30 days supply | Qty: 90 | Fill #3

## 2017-05-19 MED FILL — ATORVASTATIN 40 MG TABLET: 40 | 30 days supply | Qty: 30 | Fill #4

## 2017-05-19 MED FILL — METOCLOPRAMIDE 5 MG TABLET: 5 | 30 days supply | Qty: 90 | Fill #2

## 2017-05-19 MED FILL — metFORMIN HCL 1000 MG TABS: 1000 | 30 days supply | Qty: 60 | Fill #4

## 2017-05-19 MED FILL — LOSARTAN POTASSIUM 100 MG T: 100 | 30 days supply | Qty: 30 | Fill #3

## 2017-06-16 MED FILL — VIT D2 1.25 MG (50,000 UNIT: 1.25 MG | 29 days supply | Qty: 1 | Fill #4

## 2017-06-16 MED FILL — LANTUS SOLOSTAR 100 UNITS/M: 100 | 28 days supply | Qty: 15 | Fill #2

## 2017-06-19 MED FILL — HUMALOG 100 UNITS/ML KWIKPE: 100 | 30 days supply | Qty: 9 | Fill #1

## 2017-06-30 ENCOUNTER — Other Ambulatory Visit: Payer: Self-pay | Admitting: Family Medicine

## 2017-06-30 DIAGNOSIS — E118 Type 2 diabetes mellitus with unspecified complications: Secondary | ICD-10-CM

## 2017-06-30 DIAGNOSIS — IMO0002 Reserved for concepts with insufficient information to code with codable children: Secondary | ICD-10-CM

## 2017-06-30 DIAGNOSIS — E1165 Type 2 diabetes mellitus with hyperglycemia: Secondary | ICD-10-CM

## 2017-06-30 DIAGNOSIS — Z794 Long term (current) use of insulin: Secondary | ICD-10-CM

## 2017-06-30 DIAGNOSIS — G40909 Epilepsy, unspecified, not intractable, without status epilepticus: Secondary | ICD-10-CM

## 2017-06-30 MED FILL — metFORMIN HCL 1000 MG TABS: 1000 | 30 days supply | Qty: 60 | Fill #5

## 2017-06-30 MED FILL — PHENYTOIN SOD EXT 100 MG CA: 100 | 30 days supply | Qty: 90 | Fill #0

## 2017-06-30 MED FILL — LOSARTAN POTASSIUM 100 MG T: 100 | 30 days supply | Qty: 30 | Fill #4

## 2017-06-30 MED FILL — GLIMEPIRIDE 4 MG TABLET: 4 | 30 days supply | Qty: 30 | Fill #5

## 2017-07-10 ENCOUNTER — Ambulatory Visit: Payer: Self-pay | Admitting: Internal Medicine

## 2017-07-21 MED FILL — VIT D2 1.25 MG (50,000 UNIT: 1.25 MG | 29 days supply | Qty: 1 | Fill #5

## 2017-07-23 MED FILL — HUMALOG 100 UNITS/ML KWIKPE: 100 | 30 days supply | Qty: 9 | Fill #2

## 2017-07-31 ENCOUNTER — Ambulatory Visit: Payer: BLUE CROSS/BLUE SHIELD | Attending: Internal Medicine | Admitting: Licensed Clinical Social Worker

## 2017-07-31 ENCOUNTER — Encounter: Payer: Self-pay | Admitting: Family Medicine

## 2017-07-31 ENCOUNTER — Ambulatory Visit: Payer: BLUE CROSS/BLUE SHIELD | Attending: Internal Medicine | Admitting: Family Medicine

## 2017-07-31 VITALS — BP 134/89 | HR 96 | Temp 98.1°F | Resp 18 | Ht 68.0 in | Wt 255.6 lb

## 2017-07-31 DIAGNOSIS — B353 Tinea pedis: Secondary | ICD-10-CM | POA: Diagnosis not present

## 2017-07-31 DIAGNOSIS — E119 Type 2 diabetes mellitus without complications: Secondary | ICD-10-CM | POA: Diagnosis present

## 2017-07-31 DIAGNOSIS — E1169 Type 2 diabetes mellitus with other specified complication: Secondary | ICD-10-CM | POA: Diagnosis not present

## 2017-07-31 DIAGNOSIS — F32A Depression, unspecified: Secondary | ICD-10-CM

## 2017-07-31 DIAGNOSIS — E785 Hyperlipidemia, unspecified: Secondary | ICD-10-CM | POA: Diagnosis not present

## 2017-07-31 DIAGNOSIS — E118 Type 2 diabetes mellitus with unspecified complications: Secondary | ICD-10-CM | POA: Diagnosis not present

## 2017-07-31 DIAGNOSIS — Z7982 Long term (current) use of aspirin: Secondary | ICD-10-CM | POA: Insufficient documentation

## 2017-07-31 DIAGNOSIS — E1140 Type 2 diabetes mellitus with diabetic neuropathy, unspecified: Secondary | ICD-10-CM | POA: Diagnosis not present

## 2017-07-31 DIAGNOSIS — L03012 Cellulitis of left finger: Secondary | ICD-10-CM

## 2017-07-31 DIAGNOSIS — L03032 Cellulitis of left toe: Secondary | ICD-10-CM | POA: Diagnosis not present

## 2017-07-31 DIAGNOSIS — Z79899 Other long term (current) drug therapy: Secondary | ICD-10-CM | POA: Insufficient documentation

## 2017-07-31 DIAGNOSIS — F419 Anxiety disorder, unspecified: Principal | ICD-10-CM

## 2017-07-31 DIAGNOSIS — IMO0002 Reserved for concepts with insufficient information to code with codable children: Secondary | ICD-10-CM

## 2017-07-31 DIAGNOSIS — E1165 Type 2 diabetes mellitus with hyperglycemia: Secondary | ICD-10-CM | POA: Insufficient documentation

## 2017-07-31 DIAGNOSIS — G40909 Epilepsy, unspecified, not intractable, without status epilepticus: Secondary | ICD-10-CM | POA: Diagnosis not present

## 2017-07-31 DIAGNOSIS — I10 Essential (primary) hypertension: Secondary | ICD-10-CM

## 2017-07-31 DIAGNOSIS — Z794 Long term (current) use of insulin: Secondary | ICD-10-CM | POA: Diagnosis not present

## 2017-07-31 DIAGNOSIS — F329 Major depressive disorder, single episode, unspecified: Secondary | ICD-10-CM

## 2017-07-31 LAB — POCT GLYCOSYLATED HEMOGLOBIN (HGB A1C): Hemoglobin A1C: 10.1

## 2017-07-31 LAB — GLUCOSE, POCT (MANUAL RESULT ENTRY): POC GLUCOSE: 214 mg/dL — AB (ref 70–99)

## 2017-07-31 MED ORDER — GABAPENTIN 300 MG PO CAPS: 300.0000 mg | ORAL_CAPSULE | Freq: Every day | ORAL | 1 refills | Status: DC

## 2017-07-31 MED ORDER — INSULIN LISPRO 100 UNIT/ML (KWIKPEN): PEN_INJECTOR | SUBCUTANEOUS | 11 refills | Status: DC

## 2017-07-31 MED ORDER — GLIMEPIRIDE 4 MG PO TABS: ORAL_TABLET | ORAL | 2 refills | Status: DC

## 2017-07-31 MED ORDER — AMLODIPINE BESYLATE 5 MG PO TABS: 5.0000 mg | ORAL_TABLET | Freq: Every day | ORAL | 2 refills | Status: DC

## 2017-07-31 MED ORDER — CEPHALEXIN 500 MG PO CAPS: 500.0000 mg | ORAL_CAPSULE | Freq: Four times a day (QID) | ORAL | 0 refills | Status: DC

## 2017-07-31 MED ORDER — TERBINAFINE HCL 1 % EX CREA: 1.0000 "application " | TOPICAL_CREAM | Freq: Two times a day (BID) | CUTANEOUS | 0 refills | Status: DC

## 2017-07-31 MED ORDER — METFORMIN HCL 1000 MG PO TABS: ORAL_TABLET | ORAL | 2 refills | Status: DC

## 2017-07-31 MED ORDER — INSULIN GLARGINE 100 UNIT/ML SOLOSTAR PEN: 45.0000 [IU] | PEN_INJECTOR | Freq: Every day | SUBCUTANEOUS | 5 refills | Status: DC

## 2017-07-31 NOTE — Progress Notes (Signed)
Patient is here for f/up   Patient complains left index finger swollen

## 2017-07-31 NOTE — Progress Notes (Signed)
Subjective:  Patient ID: Albert Garcia, male    DOB: 01/02/1971  Age: 46 y.o. MRN: [id number]  CC: Diabetes   HPI Albert Garcia presents to re-establish care. PMH of DM, HTN, HLD, and seizure disorder. History of DM. Symptoms: paresthesia of the feet. Patient denies foot ulcerations, nausea, polydipsia, polyuria, visual disturbances and vomitting. Evaluation to date has been included: fasting blood sugar, fasting lipid panel and hemoglobin A1C. Home sugars: patient does not check sugars. Treatment to date: metformin. History of HTN/HLD.  He is not exercising and is not adherent to low salt diet.  He does not check BP at home. Cardiac symptoms none. Patient denies chest pain, chest pressure/discomfort, claudication, dyspnea, lower extremity edema, near-syncope, palpitations and syncope.  Cardiovascular risk factors: dyslipidemia, hypertension, male gender and sedentary lifestyle. Use of agents associated with hypertension: none. History of target organ damage: none. History of seizure disorder. He reports last seizure was years ago. Elevated PHQ9/GAD screening. He reports his 1 year old son had suicidal attempt a few weeks ago.  He denies any HI/SI. He is agreeable to speaking with LCSW.    Outpatient Medications Prior to Visit  Medication Sig Dispense Refill  . acetaminophen (ARTHRITIS PAIN RELIEF) 650 MG CR tablet Take 650 mg by mouth every 8 (eight) hours as needed for pain. Reported on 11/13/2015    . aspirin 325 MG EC tablet Take 325 mg by mouth daily.    Marland Kitchen atorvastatin (LIPITOR) 40 MG tablet Take 1 tablet (40 mg total) by mouth daily. 30 tablet 5  . Blood Glucose Monitoring Suppl (TRUE METRIX METER) w/Device KIT 1 each by Does not apply route as needed. 1 kit 0  . glucose blood (TRUE METRIX BLOOD GLUCOSE TEST) test strip 1 each by Other route 3 (three) times daily. 100 each 11  . Insulin Pen Needle 31G X 8 MM MISC Use as directed 100 each 2  . losartan (COZAAR) 100 MG tablet Take 1 tablet  (100 mg total) by mouth daily. 30 tablet 5  . metoCLOPramide (REGLAN) 5 MG tablet Take 1 tablet (5 mg total) by mouth 3 (three) times daily before meals. 90 tablet 2  . Multiple Vitamin (MULTIVITAMIN WITH MINERALS) TABS tablet Take 1 tablet by mouth daily. Reported on 11/13/2015    . phenytoin (DILANTIN) 100 MG ER capsule TAKE 3 CAPSULES BY MOUTH ONCE A DAY 90 capsule 3  . TRUEPLUS LANCETS 28G MISC 1 each by Does not apply route 3 (three) times daily. 100 each 11  . atorvastatin (LIPITOR) 20 MG tablet Take 2 tablets (40 mg total) by mouth daily. (Patient not taking: Reported on 05/01/2017) 90 tablet 3  . glimepiride (AMARYL) 4 MG tablet TAKE 1 TABLET BY MOUTH DAILY WITH BREAKFAST. 30 tablet 2  . Insulin Glargine (LANTUS SOLOSTAR) 100 UNIT/ML Solostar Pen Inject 45 Units into the skin at bedtime. 15 mL 5  . insulin lispro (HUMALOG KWIKPEN) 100 UNIT/ML KiwkPen 12 units twice a day Subcut with the 2 largest meals of the day 15 mL 11  . metFORMIN (GLUCOPHAGE) 1000 MG tablet TAKE 1 TABLET BY MOUTH 2 TIMES DAILY WITH A MEAL. 60 tablet 2  . Vitamin D, Ergocalciferol, (DRISDOL) 50000 units CAPS capsule Take 1 capsule (50,000 Units total) by mouth every 30 (thirty) days. 4 capsule 3   No facility-administered medications prior to visit.     ROS Review of Systems  Constitutional: Negative.   Respiratory: Negative.   Cardiovascular: Negative.   Gastrointestinal: Negative.  Skin: Negative.   Psychiatric/Behavioral: Negative for suicidal ideas.   Objective:  BP 134/89   Pulse 96   Temp 98.1 F (36.7 C) (Oral)   Resp 18   Ht '5\' 8"'  (1.727 m)   Wt 255 lb 9.6 oz (115.9 kg)   SpO2 97%   BMI 38.86 kg/m   BP/Weight 07/31/2017 05/01/2017 8/54/6270  Systolic BP 350 093 818  Diastolic BP 89 86 299  Wt. (Lbs) 255.6 250 260  BMI 38.86 38.01 39.53     Physical Exam  Constitutional: He appears well-developed and well-nourished.  Eyes: Pupils are equal, round, and reactive to light. Conjunctivae are  normal.  Cardiovascular: Normal rate, regular rhythm, normal heart sounds and intact distal pulses.   Pulmonary/Chest: Effort normal and breath sounds normal.  Abdominal: Soft. Bowel sounds are normal.  Skin: Skin is warm and dry. Abrasion (to the 2nd toe; no drainage present; mild swelling; dark hyperpigmenation arounding area) noted.  Mild swelling to finger of left hand  Psychiatric: He is agitated. He expresses no homicidal and no suicidal ideation. He expresses no suicidal plans and no homicidal plans.  Nursing note and vitals reviewed.  Depression screen [facility] 2/9 07/31/2017 11/28/2016 11/13/2015  Decreased Interest (No Data) 2 0  Down, Depressed, Hopeless 2 0 0  PHQ - 2 Score 2 2 0  Altered sleeping '3 3 1  ' Tired, decreased energy '1 3 1  ' Change in appetite '2 3 1  ' Feeling bad or failure about yourself  2 0 0  Trouble concentrating 0 3 0  Moving slowly or fidgety/restless 0 0 0  Suicidal thoughts 0 0 0  PHQ-9 Score '10 14 3   ' GAD 7 : Generalized Anxiety Score 07/31/2017 11/28/2016 11/13/2015  Nervous, Anxious, on Edge 1 0 1  Control/stop worrying '3 3 2  ' Worry too much - different things '3 3 2  ' Trouble relaxing '2 3 1  ' Restless 1 0 1  Easily annoyed or irritable 0 2 0  Afraid - awful might happen '3 3 2  ' Total GAD 7 Score '13 14 9      ' Assessment & Plan:   1. Uncontrolled type 2 diabetes mellitus with complication, with long-term current use of insulin (HCC)  - metFORMIN (GLUCOPHAGE) 1000 MG tablet; TAKE 1 TABLET BY MOUTH 2 TIMES DAILY WITH A MEAL.  Dispense: 60 tablet; Refill: 2 - Insulin Glargine (LANTUS SOLOSTAR) 100 UNIT/ML Solostar Pen; Inject 45 Units into the skin at bedtime.  Dispense: 15 mL; Refill: 5 - glimepiride (AMARYL) 4 MG tablet; TAKE 2 TABLETS BY MOUTH DAILY WITH BREAKFAST.  Dispense: 60 tablet; Refill: 2 - POCT ABI Screening for Pilot No Charge - insulin lispro (HUMALOG KWIKPEN) 100 UNIT/ML KiwkPen; Less than 70. Do not give insulin. Treat for hypoglycemia 70-139 =  0 units 140-180 = 4 units sbq 181-240 = 6 units sbq 241-300 = 8 units sbq 301-350 = 10 units sbq 351-400 = 12 units sbq Greater than 400 = 14 units; and repeat CBG check in 30 minutes  Dispense: 15 mL; Refill: 11 - Ambulatory referral to Ophthalmology -LCSW spoke with patient and provided resources.  2. Hyperlipidemia associated with type 2 diabetes mellitus (HCC)  - Glucose (CBG) - HgB A1c - CMP and Liver - Lipid Panel - POCT ABI Screening for Pilot No Charge  3. Essential hypertension Follow up in two weeks for HTN. - CMP and Liver - Lipid Panel - amLODipine (NORVASC) 5 MG tablet; Take 1 tablet (5 mg total) by mouth  daily.  Dispense: 30 tablet; Refill: 2  4. Cellulitis of finger of left hand  - cephALEXin (KEFLEX) 500 MG capsule; Take 1 capsule (500 mg total) by mouth 4 (four) times daily.  Dispense: 28 capsule; Refill: 0  5. Cellulitis of toe of left foot  - POCT ABI Screening for Pilot No Charge - cephALEXin (KEFLEX) 500 MG capsule; Take 1 capsule (500 mg total) by mouth 4 (four) times daily.  Dispense: 28 capsule; Refill: 0  6. Type 2 diabetes mellitus with diabetic neuropathy, with long-term current use of insulin (HCC)  - gabapentin (NEURONTIN) 300 MG capsule; Take 1 capsule (300 mg total) by mouth at bedtime.  Dispense: 30 capsule; Refill: 1  7. Tinea pedis of both feet  - Ambulatory referral to Podiatry   Meds ordered this encounter  Medications  . metFORMIN (GLUCOPHAGE) 1000 MG tablet    Sig: TAKE 1 TABLET BY MOUTH 2 TIMES DAILY WITH A MEAL.    Dispense:  60 tablet    Refill:  2    Order Specific Question:   Supervising Provider    Answer:   Tresa Garter [id number]  . Insulin Glargine (LANTUS SOLOSTAR) 100 UNIT/ML Solostar Pen    Sig: Inject 45 Units into the skin at bedtime.    Dispense:  15 mL    Refill:  5    Order Specific Question:   Supervising Provider    Answer:   Tresa Garter [id number]  . glimepiride (AMARYL) 4 MG tablet     Sig: TAKE 2 TABLETS BY MOUTH DAILY WITH BREAKFAST.    Dispense:  60 tablet    Refill:  2    Order Specific Question:   Supervising Provider    Answer:   Tresa Garter [id number]  . amLODipine (NORVASC) 5 MG tablet    Sig: Take 1 tablet (5 mg total) by mouth daily.    Dispense:  30 tablet    Refill:  2    Order Specific Question:   Supervising Provider    Answer:   Tresa Garter [id number]  . gabapentin (NEURONTIN) 300 MG capsule    Sig: Take 1 capsule (300 mg total) by mouth at bedtime.    Dispense:  30 capsule    Refill:  1    Order Specific Question:   Supervising Provider    Answer:   Tresa Garter [id number]  . insulin lispro (HUMALOG KWIKPEN) 100 UNIT/ML KiwkPen    Sig: Less than 70. Do not give insulin. Treat for hypoglycemia 70-139 = 0 units 140-180 = 4 units sbq 181-240 = 6 units sbq 241-300 = 8 units sbq 301-350 = 10 units sbq 351-400 = 12 units sbq Greater than 400 = 14 units; and repeat CBG check in 30 minutes    Dispense:  15 mL    Refill:  11    Order Specific Question:   Supervising Provider    Answer:   Tresa Garter [id number]  . terbinafine (LAMISIL AT) 1 % cream    Sig: Apply 1 application topically 2 (two) times daily. For 14 days.    Dispense:  30 g    Refill:  0    Order Specific Question:   Supervising Provider    Answer:   Tresa Garter [id number]  . cephALEXin (KEFLEX) 500 MG capsule    Sig: Take 1 capsule (500 mg total) by mouth 4 (four) times daily.    Dispense:  28 capsule    Refill:  0    Order Specific Question:   Supervising Provider    Answer:   Tresa Garter [[id number]]    Follow-up: Return in about 2 weeks (around 08/14/2017) for BP/Cellulitis.   Alfonse Spruce FNP

## 2017-07-31 NOTE — Patient Instructions (Addendum)
Check blood sugars three times a day before meals and every night at bedtime. You will be called with your labs results. Bring glucometer or blood sugar log to your next office visit.    Cellulitis, Adult Cellulitis is a skin infection. The infected area is usually red and sore. This condition occurs most often in the arms and lower legs. It is very important to get treated for this condition. Follow these instructions at home:  Take over-the-counter and prescription medicines only as told by your doctor.  If you were prescribed an antibiotic medicine, take it as told by your doctor. Do not stop taking the antibiotic even if you start to feel better.  Drink enough fluid to keep your pee (urine) clear or pale yellow.  Do not touch or rub the infected area.  Raise (elevate) the infected area above the level of your heart while you are sitting or lying down.  Place warm or cold wet cloths (warm or cold compresses) on the infected area. Do this as told by your doctor.  Keep all follow-up visits as told by your doctor. This is important. These visits let your doctor make sure your infection is not getting worse. Contact a doctor if:  You have a fever.  Your symptoms do not get better after 1-2 days of treatment.  Your bone or joint under the infected area starts to hurt after the skin has healed.  Your infection comes back. This can happen in the same area or another area.  You have a swollen bump in the infected area.  You have new symptoms.  You feel ill and also have muscle aches and pains. Get help right away if:  Your symptoms get worse.  You feel very sleepy.  You throw up (vomit) or have watery poop (diarrhea) for a long time.  There are red streaks coming from the infected area.  Your red area gets larger.  Your red area turns darker. This information is not intended to replace advice given to you by your health care provider. Make sure you discuss any questions  you have with your health care provider. Document Released: 03/31/2008 Document Revised: 03/20/2016 Document Reviewed: 08/22/2015 Elsevier Interactive Patient Education  2018 Elsevier Inc.   Blood Glucose Monitoring, Adult Monitoring your blood sugar (glucose) helps you manage your diabetes. It also helps you and your health care provider determine how well your diabetes management plan is working. Blood glucose monitoring involves checking your blood glucose as often as directed, and keeping a record (log) of your results over time. Why should I monitor my blood glucose? Checking your blood glucose regularly can:  Help you understand how food, exercise, illnesses, and medicines affect your blood glucose.  Let you know what your blood glucose is at any time. You can quickly tell if you are having low blood glucose (hypoglycemia) or high blood glucose (hyperglycemia).  Help you and your health care provider adjust your medicines as needed.  When should I check my blood glucose? Follow instructions from your health care provider about how often to check your blood glucose. This may depend on:  The type of diabetes you have.  How well-controlled your diabetes is.  Medicines you are taking.  If you have type 1 diabetes:  Check your blood glucose at least 2 times a day.  Also check your blood glucose: ? Before every insulin injection. ? Before and after exercise. ? Between meals. ? 2 hours after a meal. ? Occasionally between 2:00  a.m. and 3:00 a.m., as directed. ? Before potentially dangerous tasks, like driving or using heavy machinery. ? At bedtime.  You may need to check your blood glucose more often, up to 6-10 times a day: ? If you use an insulin pump. ? If you need multiple daily injections (MDI). ? If your diabetes is not well-controlled. ? If you are ill. ? If you have a history of severe hypoglycemia. ? If you have a history of not knowing when your blood glucose is  getting low (hypoglycemia unawareness). If you have type 2 diabetes:  If you take insulin or other diabetes medicines, check your blood glucose at least 2 times a day.  If you are on intensive insulin therapy, check your blood glucose at least 4 times a day. Occasionally, you may also need to check between 2:00 a.m. and 3:00 a.m., as directed.  Also check your blood glucose: ? Before and after exercise. ? Before potentially dangerous tasks, like driving or using heavy machinery.  You may need to check your blood glucose more often if: ? Your medicine is being adjusted. ? Your diabetes is not well-controlled. ? You are ill. What is a blood glucose log?  A blood glucose log is a record of your blood glucose readings. It helps you and your health care provider: ? Look for patterns in your blood glucose over time. ? Adjust your diabetes management plan as needed.  Every time you check your blood glucose, write down your result and notes about things that may be affecting your blood glucose, such as your diet and exercise for the day.  Most glucose meters store a record of glucose readings in the meter. Some meters allow you to download your records to a computer. How do I check my blood glucose? Follow these steps to get accurate readings of your blood glucose: Supplies needed   Blood glucose meter.  Test strips for your meter. Each meter has its own strips. You must use the strips that come with your meter.  A needle to prick your finger (lancet). Do not use lancets more than once.  A device that holds the lancet (lancing device).  A journal or log book to write down your results. Procedure  Wash your hands with soap and water.  Prick the side of your finger (not the tip) with the lancet. Use a different finger each time.  Gently rub the finger until a small drop of blood appears.  Follow instructions that come with your meter for inserting the test strip, applying blood to  the strip, and using your blood glucose meter.  Write down your result and any notes. Alternative testing sites  Some meters allow you to use areas of your body other than your finger (alternative sites) to test your blood.  If you think you may have hypoglycemia, or if you have hypoglycemia unawareness, do not use alternative sites. Use your finger instead.  Alternative sites may not be as accurate as the fingers, because blood flow is slower in these areas. This means that the result you get may be delayed, and it may be different from the result that you would get from your finger.  The most common alternative sites are: ? Forearm. ? Thigh. ? Palm of the hand. Additional tips  Always keep your supplies with you.  If you have questions or need help, all blood glucose meters have a 24-hour "hotline" number that you can call. You may also contact your health care  provider.  After you use a few boxes of test strips, adjust (calibrate) your blood glucose meter by following instructions that came with your meter. This information is not intended to replace advice given to you by your health care provider. Make sure you discuss any questions you have with your health care provider. Document Released: 10/16/2003 Document Revised: 05/02/2016 Document Reviewed: 03/24/2016 Elsevier Interactive Patient Education  2017 Elsevier Inc.  Diabetes Mellitus and Food It is important for you to manage your blood sugar (glucose) level. Your blood glucose level can be greatly affected by what you eat. Eating healthier foods in the appropriate amounts throughout the day at about the same time each day will help you control your blood glucose level. It can also help slow or prevent worsening of your diabetes mellitus. Healthy eating may even help you improve the level of your blood pressure and reach or maintain a healthy weight. General recommendations for healthful eating and cooking habits  include:  Eating meals and snacks regularly. Avoid going long periods of time without eating to lose weight.  Eating a diet that consists mainly of plant-based foods, such as fruits, vegetables, nuts, legumes, and whole grains.  Using low-heat cooking methods, such as baking, instead of high-heat cooking methods, such as deep frying.  Work with your dietitian to make sure you understand how to use the Nutrition Facts information on food labels. How can food affect me? Carbohydrates Carbohydrates affect your blood glucose level more than any other type of food. Your dietitian will help you determine how many carbohydrates to eat at each meal and teach you how to count carbohydrates. Counting carbohydrates is important to keep your blood glucose at a healthy level, especially if you are using insulin or taking certain medicines for diabetes mellitus. Alcohol Alcohol can cause sudden decreases in blood glucose (hypoglycemia), especially if you use insulin or take certain medicines for diabetes mellitus. Hypoglycemia can be a life-threatening condition. Symptoms of hypoglycemia (sleepiness, dizziness, and disorientation) are similar to symptoms of having too much alcohol. If your health care provider has given you approval to drink alcohol, do so in moderation and use the following guidelines:  Women should not have more than one drink per day, and men should not have more than two drinks per day. One drink is equal to: ? 12 oz of beer. ? 5 oz of wine. ? 1 oz of hard liquor.  Do not drink on an empty stomach.  Keep yourself hydrated. Have water, diet soda, or unsweetened iced tea.  Regular soda, juice, and other mixers might contain a lot of carbohydrates and should be counted.  What foods are not recommended? As you make food choices, it is important to remember that all foods are not the same. Some foods have fewer nutrients per serving than other foods, even though they might have the  same number of calories or carbohydrates. It is difficult to get your body what it needs when you eat foods with fewer nutrients. Examples of foods that you should avoid that are high in calories and carbohydrates but low in nutrients include:  Trans fats (most processed foods list trans fats on the Nutrition Facts label).  Regular soda.  Juice.  Candy.  Sweets, such as cake, pie, doughnuts, and cookies.  Fried foods.  What foods can I eat? Eat nutrient-rich foods, which will nourish your body and keep you healthy. The food you should eat also will depend on several factors, including:  The calories you  need.  The medicines you take.  Your weight.  Your blood glucose level.  Your blood pressure level.  Your cholesterol level.  You should eat a variety of foods, including:  Protein. ? Lean cuts of meat. ? Proteins low in saturated fats, such as fish, egg whites, and beans. Avoid processed meats.  Fruits and vegetables. ? Fruits and vegetables that may help control blood glucose levels, such as apples, mangoes, and yams.  Dairy products. ? Choose fat-free or low-fat dairy products, such as milk, yogurt, and cheese.  Grains, bread, pasta, and rice. ? Choose whole grain products, such as multigrain bread, whole oats, and brown rice. These foods may help control blood pressure.  Fats. ? Foods containing healthful fats, such as nuts, avocado, olive oil, canola oil, and fish.  Does everyone with diabetes mellitus have the same meal plan? Because every person with diabetes mellitus is different, there is not one meal plan that works for everyone. It is very important that you meet with a dietitian who will help you create a meal plan that is just right for you. This information is not intended to replace advice given to you by your health care provider. Make sure you discuss any questions you have with your health care provider. Document Released: 07/10/2005 Document  Revised: 03/20/2016 Document Reviewed: 09/09/2013 Elsevier Interactive Patient Education  2017 [employer].

## 2017-07-31 NOTE — BH Specialist Note (Signed)
Integrated Behavioral Health Initial Visit  MRN: [id number] Name: Albert Garcia  Number of Integrated Behavioral Health Clinician visits:: 1/6 Session Start time: 10:40 AM  Session End time: 11:10 AM Total time: 30 minutes  Type of Service: Integrated Behavioral Health- Individual/Family Interpretor:No. Interpretor Name and Language: N/A   Warm Hand Off Completed.       SUBJECTIVE: Albert Garcia is a 46 y.o. male accompanied by self Patient was referred by FNP [place] for depression and anxiety. Patient reports the following symptoms/concerns: overwhelming feelings of worry for children, difficulty sleeping, and difficulty relaxing Duration of problem: Couple of weeks; Severity of problem: mild  OBJECTIVE: Mood: Anxious and Affect: Appropriate Risk of harm to self or others: No plan to harm self or others  LIFE CONTEXT: Family and Social: Pt has five sons and one daughter. He receives support from family and friends School/Work: Pt is employed Self-Care: No report of substance use Life Changes: Pt's son recently attempted to commit suicide. Pt has a strained relationship with his ex  GOALS ADDRESSED: Patient will: 1. Reduce symptoms of: anxiety and depression 2. Increase knowledge and/or ability of: coping skills  3. Demonstrate ability to: Increase adequate support systems for patient/family  INTERVENTIONS: Interventions utilized: Solution-Focused Strategies, Supportive Counseling, Psychoeducation and/or Health Education and Link to [facility]  Standardized Assessments completed: GAD-7 and PHQ 2&9  ASSESSMENT: Patient currently experiencing mild depression and anxiety triggered by minor son's recent attempt to complete suicide. Pt reports overwhelming feelings of worry for children, difficulty sleeping, and difficulty relaxing. He receives support from family and friends   Patient may benefit from psychoeducation and psychotherapy. LCSWA educated pt on the  correlation between anxiety and depression. Pt was taught techniques to assist in communicating effectively with children and ex. He identified healtthy coping skills to decrease symptoms. LCSWA provided pt with resources on the [employer] of Mental Illness, crisis intervention, and supportive services for son.   PLAN: 1. Follow up with behavioral health clinician on : Pt was encouraged to contact LCSWA if symptoms worsen or fail to improve to schedule behavioral appointments at [facility]. 2. Behavioral recommendations: LCSWA recommends that pt apply healthy coping skills discussed and utilize provided resources. Pt is encouraged to schedule follow up appointment with LCSWA 3. Referral(s): Community Mental Health Services (LME/Outside Clinic) 4. "From scale of 1-10, how likely are you to follow plan?": 10/10  Bridgett Larsson, LCSW 08/04/17 9:37 AM

## 2017-08-01 LAB — CMP AND LIVER
ALT: 45 IU/L — ABNORMAL HIGH (ref 0–44)
AST: 29 IU/L (ref 0–40)
Albumin: 4.5 g/dL (ref 3.5–5.5)
Alkaline Phosphatase: 72 IU/L (ref 39–117)
BUN: 15 mg/dL (ref 6–24)
Bilirubin Total: 0.2 mg/dL (ref 0.0–1.2)
Bilirubin, Direct: 0.08 mg/dL (ref 0.00–0.40)
CALCIUM: 9.7 mg/dL (ref 8.7–10.2)
CO2: 24 mmol/L (ref 20–29)
CREATININE: 0.68 mg/dL — AB (ref 0.76–1.27)
Chloride: 101 mmol/L (ref 96–106)
GFR calc non Af Amer: 115 mL/min/{1.73_m2} (ref >59)
GFR, EST AFRICAN AMERICAN: 133 mL/min/{1.73_m2} (ref >59)
Glucose: 132 mg/dL — ABNORMAL HIGH (ref 65–99)
Potassium: 3.9 mmol/L (ref 3.5–5.2)
SODIUM: 142 mmol/L (ref 134–144)
TOTAL PROTEIN: 7.6 g/dL (ref 6.0–8.5)

## 2017-08-01 LAB — LIPID PANEL
CHOL/HDL RATIO: 3.6 ratio (ref 0.0–5.0)
Cholesterol, Total: 132 mg/dL (ref 100–199)
HDL: 37 mg/dL — ABNORMAL LOW (ref >39)
LDL CALC: 39 mg/dL (ref 0–99)
Triglycerides: 282 mg/dL — ABNORMAL HIGH (ref 0–149)
VLDL CHOLESTEROL CAL: 56 mg/dL — AB (ref 5–40)

## 2017-08-06 ENCOUNTER — Telehealth: Payer: Self-pay

## 2017-08-06 ENCOUNTER — Other Ambulatory Visit: Payer: Self-pay | Admitting: Family Medicine

## 2017-08-06 DIAGNOSIS — Z794 Long term (current) use of insulin: Principal | ICD-10-CM

## 2017-08-06 DIAGNOSIS — IMO0002 Reserved for concepts with insufficient information to code with codable children: Secondary | ICD-10-CM

## 2017-08-06 DIAGNOSIS — E785 Hyperlipidemia, unspecified: Secondary | ICD-10-CM

## 2017-08-06 DIAGNOSIS — E1169 Type 2 diabetes mellitus with other specified complication: Secondary | ICD-10-CM

## 2017-08-06 DIAGNOSIS — E782 Mixed hyperlipidemia: Secondary | ICD-10-CM

## 2017-08-06 DIAGNOSIS — E1165 Type 2 diabetes mellitus with hyperglycemia: Secondary | ICD-10-CM

## 2017-08-06 DIAGNOSIS — E118 Type 2 diabetes mellitus with unspecified complications: Principal | ICD-10-CM

## 2017-08-06 MED ORDER — ATORVASTATIN CALCIUM 80 MG PO TABS: 80.0000 mg | ORAL_TABLET | Freq: Every day | ORAL | 3 refills | Status: DC

## 2017-08-06 MED FILL — ATORVASTATIN 80 MG TABLET: 80 | 30 days supply | Qty: 30 | Fill #0

## 2017-08-06 MED FILL — PHENYTOIN SOD EXT 100 MG CA: 100 | 30 days supply | Qty: 90 | Fill #1

## 2017-08-06 NOTE — Telephone Encounter (Signed)
CMA call regarding lab results   Patient Verify DOB   Patient was aware and understood  

## 2017-08-06 NOTE — Telephone Encounter (Signed)
-----   Message from Lizbeth Bark, FNP sent at 08/06/2017 12:49 PM EDT ----- Kidney function normal Liver function normal Lipid levels were elevated. This can increase your risk of heart disease overtime. Your dosage of atorvastatin will be increased. Start eating a diet low in saturated fat. Limit your intake of fried foods, red meats, and whole milk. Increase activity. Recommend follow up in 3 months.

## 2017-08-12 ENCOUNTER — Ambulatory Visit: Payer: BLUE CROSS/BLUE SHIELD | Attending: Internal Medicine | Admitting: Licensed Clinical Social Worker

## 2017-08-12 DIAGNOSIS — F4321 Adjustment disorder with depressed mood: Secondary | ICD-10-CM

## 2017-08-12 MED FILL — CEPHALEXIN 500 MG CAPSULE: 500 | 7 days supply | Qty: 28 | Fill #0

## 2017-08-12 MED FILL — metFORMIN HCL 1000 MG TABS: 1000 | 30 days supply | Qty: 60 | Fill #0

## 2017-08-12 MED FILL — AMLODIPINE BESYLATE 5 MG TA: 5 | 30 days supply | Qty: 30 | Fill #0

## 2017-08-12 MED FILL — GLIMEPIRIDE 4 MG TABLET: 4 | 30 days supply | Qty: 60 | Fill #0

## 2017-08-12 MED FILL — GABAPENTIN 300 MG CAPSULE: 300 | 30 days supply | Qty: 30 | Fill #0

## 2017-08-12 NOTE — BH Specialist Note (Signed)
Integrated Behavioral Health Follow Up Visit  MRN: [id number] Name: Albert Garcia  Number of Integrated Behavioral Health Clinician visits: 2/6 Session Start time: 11:00 AM  Session End time: 12:00 AM Total time: 1 hour  Type of Service: Integrated Behavioral Health- Individual/Family Interpretor:No. Interpretor Name and Language: N.A  SUBJECTIVE: Albert Garcia is a 46 y.o. male accompanied by self Patient was referred by FNP Hairston for depression and anxiety. Patient reports the following symptoms/concerns: overwhelming feelings of worry, difficulty sleeping, difficulty relaxing, and grief Duration of problem: 1 month; Severity of problem: moderate  OBJECTIVE: Mood: Irritable and Affect: Appropriate Risk of harm to self or others: No plan to harm self or others  LIFE CONTEXT: Family and Social: Pt has five sons and one daughter. He receives support from family and friends School/Work: Pt is employed Self-Care: No report of substance use Life Changes: Pt's son was recently discharged from the hospital for behavioral health. Pt's grandmother recently passed away.  GOALS ADDRESSED: Patient will: 1.  Reduce symptoms of: agitation, anxiety and depression  2.  Increase knowledge and/or ability of: coping skills  3.  Demonstrate ability to: Increase adequate support systems for patient/family and Begin healthy grieving over loss  INTERVENTIONS: Interventions utilized:  Mindfulness or Management consultantelaxation Training, Supportive Counseling, Psychoeducation and/or Health Education and Link to [facility] Standardized Assessments completed: Not Needed  ASSESSMENT: Patient currently experiencing depression and anxiety triggered by child's behavioral health and the recent passing of pt's grandmother. Pt reported increased agitation and difficulty managing anger.   Patient may benefit from psychoeducation and psychotherapy. LCSWA validated pt's feelings and offered encouragement. Pt was  educated on stages of grief and relaxation techniques to assist in emotional regulation. LCSWA provided pt with grief support services.  PLAN: 1. Follow up with behavioral health clinician on : Pt was encouraged tocontact LCSWA if symptoms worsen or fail to improveto schedule [facility]. 2. Behavioral recommendations: LCSWA recommends that pt apply healthy coping skills discussed and utilize provided resources. Pt is encouraged to schedule follow up appointment with LCSWA 3. Referral(s): Grief Support 4. "From scale of 1-10, how likely are you to follow plan?": 9/10  Bridgett LarssonJasmine D Adeja Sarratt, LCSW 08/14/17 11:21 AM

## 2017-08-26 ENCOUNTER — Encounter: Payer: Self-pay | Admitting: Licensed Clinical Social Worker

## 2017-09-01 ENCOUNTER — Ambulatory Visit: Payer: Self-pay | Admitting: Licensed Clinical Social Worker

## 2017-09-29 MED FILL — metFORMIN HCL 1000 MG TABS: 1000 | 30 days supply | Qty: 60 | Fill #1

## 2017-09-29 MED FILL — VIT D2 1.25 MG (50,000 UNIT: 1.25 MG | 29 days supply | Qty: 1 | Fill #6

## 2017-09-29 MED FILL — PHENYTOIN SOD EXT 100 MG CA: 100 | 30 days supply | Qty: 90 | Fill #2

## 2017-09-29 MED FILL — GLIMEPIRIDE 4 MG TABLET: 4 | 30 days supply | Qty: 60 | Fill #1 | Status: TO

## 2017-09-29 MED FILL — GABAPENTIN 300 MG CAPSULE: 300 | 30 days supply | Qty: 30 | Fill #1

## 2017-09-29 MED FILL — LANTUS SOLOSTAR 100 UNITS/M: 100 | 30 days supply | Qty: 15 | Fill #3

## 2017-09-29 MED FILL — HUMALOG 100 UNITS/ML KWIKPE: 100 | 30 days supply | Qty: 9 | Fill #3

## 2017-11-06 MED FILL — ATORVASTATIN 80 MG TABLET: 80 | 30 days supply | Qty: 30 | Fill #1 | Status: TO

## 2017-11-06 MED FILL — metFORMIN HCL 1000 MG TABS: 1000 | 30 days supply | Qty: 60 | Fill #2

## 2017-11-06 MED FILL — PHENYTOIN SOD EXT 100 MG CA: 100 | 30 days supply | Qty: 90 | Fill #3

## 2017-11-06 MED FILL — VIT D2 1.25 MG (50,000 UNIT: 1.25 MG | 29 days supply | Qty: 1 | Fill #7

## 2017-11-06 MED FILL — AMLODIPINE BESYLATE 5 MG TA: 5 | 30 days supply | Qty: 30 | Fill #1

## 2017-12-06 ENCOUNTER — Emergency Department (HOSPITAL_BASED_OUTPATIENT_CLINIC_OR_DEPARTMENT_OTHER)
Admission: EM | Admit: 2017-12-06 | Discharge: 2017-12-07 | Disposition: A | Discharge status: Home / Self Care | Payer: BLUE CROSS/BLUE SHIELD | Attending: Emergency Medicine | Admitting: Emergency Medicine

## 2017-12-06 ENCOUNTER — Other Ambulatory Visit: Payer: Self-pay

## 2017-12-06 ENCOUNTER — Encounter (HOSPITAL_BASED_OUTPATIENT_CLINIC_OR_DEPARTMENT_OTHER): Payer: Self-pay | Admitting: Emergency Medicine

## 2017-12-06 DIAGNOSIS — Z7982 Long term (current) use of aspirin: Secondary | ICD-10-CM | POA: Diagnosis not present

## 2017-12-06 DIAGNOSIS — E119 Type 2 diabetes mellitus without complications: Secondary | ICD-10-CM | POA: Diagnosis not present

## 2017-12-06 DIAGNOSIS — Z794 Long term (current) use of insulin: Secondary | ICD-10-CM | POA: Diagnosis not present

## 2017-12-06 DIAGNOSIS — R42 Dizziness and giddiness: Secondary | ICD-10-CM

## 2017-12-06 DIAGNOSIS — Z79899 Other long term (current) drug therapy: Secondary | ICD-10-CM | POA: Diagnosis not present

## 2017-12-06 DIAGNOSIS — R739 Hyperglycemia, unspecified: Secondary | ICD-10-CM | POA: Insufficient documentation

## 2017-12-06 DIAGNOSIS — I10 Essential (primary) hypertension: Secondary | ICD-10-CM | POA: Diagnosis not present

## 2017-12-06 LAB — BASIC METABOLIC PANEL
Anion gap: 10 (ref 5–15)
BUN: 16 mg/dL (ref 6–20)
CHLORIDE: 98 mmol/L — AB (ref 101–111)
CO2: 25 mmol/L (ref 22–32)
CREATININE: 0.89 mg/dL (ref 0.61–1.24)
Calcium: 9.6 mg/dL (ref 8.9–10.3)
GFR calc Af Amer: >60 mL/min (ref >60)
GFR calc non Af Amer: >60 mL/min (ref >60)
GLUCOSE: 411 mg/dL — AB (ref 65–99)
Potassium: 3.7 mmol/L (ref 3.5–5.1)
Sodium: 133 mmol/L — ABNORMAL LOW (ref 135–145)

## 2017-12-06 LAB — CBC WITH DIFFERENTIAL/PLATELET
Basophils Absolute: 0 10*3/uL (ref 0.0–0.1)
Basophils Relative: 1 %
EOS ABS: 0.1 10*3/uL (ref 0.0–0.7)
Eosinophils Relative: 2 %
HCT: 40.4 % (ref 39.0–52.0)
HEMOGLOBIN: 14.7 g/dL (ref 13.0–17.0)
LYMPHS ABS: 2.5 10*3/uL (ref 0.7–4.0)
Lymphocytes Relative: 55 %
MCH: 32.7 pg (ref 26.0–34.0)
MCHC: 36.4 g/dL — AB (ref 30.0–36.0)
MCV: 90 fL (ref 78.0–100.0)
MONOS PCT: 10 %
Monocytes Absolute: 0.4 10*3/uL (ref 0.1–1.0)
NEUTROS PCT: 32 %
Neutro Abs: 1.4 10*3/uL — ABNORMAL LOW (ref 1.7–7.7)
Platelets: 225 10*3/uL (ref 150–400)
RBC: 4.49 MIL/uL (ref 4.22–5.81)
RDW: 12.4 % (ref 11.5–15.5)
WBC: 4.4 10*3/uL (ref 4.0–10.5)

## 2017-12-06 LAB — CBG MONITORING, ED
Glucose-Capillary: 442 mg/dL — ABNORMAL HIGH (ref 65–99)
Glucose-Capillary: 388 mg/dL — ABNORMAL HIGH (ref 65–99)

## 2017-12-06 MED ORDER — INSULIN ASPART 100 UNIT/ML ~~LOC~~ SOLN
10.0000 [IU] | Freq: Once | SUBCUTANEOUS | Status: AC
  Administered 2017-12-06: 10 [IU] via SUBCUTANEOUS
  Filled 2017-12-06: qty 1

## 2017-12-06 MED ORDER — SODIUM CHLORIDE 0.9 % IV BOLUS (SEPSIS)
1000.0000 mL | Freq: Once | INTRAVENOUS | Status: AC
  Administered 2017-12-06: 1000 mL via INTRAVENOUS

## 2017-12-06 NOTE — ED Triage Notes (Addendum)
Pt presents with c/o feeling like his sugar is high. PT has not checked his sugar. PT reports his symptoms were slight dizzy and bumped into a workstation and headache.  Pt states he did not take his insulin today because he has not eaten today.

## 2017-12-07 LAB — CBG MONITORING, ED
GLUCOSE-CAPILLARY: 244 mg/dL — AB (ref 65–99)
GLUCOSE-CAPILLARY: 331 mg/dL — AB (ref 65–99)

## 2017-12-07 LAB — PHENYTOIN LEVEL, TOTAL: Phenytoin Lvl: <2.5 ug/mL — ABNORMAL LOW (ref 10.0–20.0)

## 2017-12-07 MED ORDER — INSULIN GLARGINE 100 UNIT/ML ~~LOC~~ SOLN
SUBCUTANEOUS | Status: AC
  Filled 2017-12-07: qty 1

## 2017-12-07 MED ORDER — INSULIN GLARGINE 100 UNIT/ML ~~LOC~~ SOLN
45.0000 [IU] | Freq: Once | SUBCUTANEOUS | Status: AC
  Administered 2017-12-07: 45 [IU] via SUBCUTANEOUS

## 2017-12-07 MED ORDER — SODIUM CHLORIDE 0.9 % IV BOLUS (SEPSIS)
1000.0000 mL | Freq: Once | INTRAVENOUS | Status: AC
  Administered 2017-12-07: 1000 mL via INTRAVENOUS

## 2017-12-07 NOTE — ED Provider Notes (Signed)
[facility] Provider Note   CSN: [id number] Arrival date & time: 12/06/17  2051     History   Chief Complaint Chief Complaint  Patient presents with  . Hyperglycemia    HPI Albert Garcia is a 47 y.o. male.  HPI  This is a 47 year old male with history of diabetes, hypertension, seizures who presents with dizziness and "feeling like my blood sugar is high."  Patient states that he was at work when he was changing a filter and experienced lightheadedness and dizziness.  He has not taken his insulin today.  Denies any weakness, numbness, tingling, strokelike symptoms.  Does report intermittent blurry vision.  Denies any chest pain, shortness of breath, fevers, abdominal pain, nausea, vomiting.  Initial blood sugar in triage 442.  Past Medical History:  Diagnosis Date  . Diabetes mellitus   . Erectile dysfunction   . Headache   . Hypertension   . Seizures [facility])     Patient Active Problem List   Diagnosis Date Noted  . Vitamin D insufficiency 11/30/2016  . 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 Long-term use of high-risk medication 12/27/2015  . Microalbuminuric diabetic nephropathy ([place]) 11/15/2015  . Hyperlipidemia associated with type 2 diabetes mellitus ([place]) 11/15/2015  . Erectile dysfunction 11/13/2015  . Muscle cramps 11/13/2015  . Type 2 diabetes mellitus, uncontrolled ([place]) 09/06/2007  . OBESITY 09/06/2007  . HTN (hypertension) 09/06/2007  . Seizure disorder ([place]) 09/06/2007    Past Surgical History:  Procedure Laterality Date  . NO PAST SURGERIES         Home Medications    Prior to Admission medications   Medication Sig Start Date End Date Taking? Authorizing Provider  acetaminophen (ARTHRITIS PAIN RELIEF) 650 MG CR tablet Take 650 mg by mouth every 8 (eight) hours as needed for pain. Reported on 11/13/2015    [provider]  amLODipine (NORVASC) 5 MG tablet Take 1 tablet (5 mg total) by mouth  daily. 07/31/17   Alfonse Spruce, FNP  aspirin 325 MG EC tablet Take 325 mg by mouth daily.    [provider]  atorvastatin (LIPITOR) 40 MG tablet Take 1 tablet (40 mg total) by mouth daily. 11/28/16   Funches, Adriana Mccallum, MD  atorvastatin (LIPITOR) 80 MG tablet Take 1 tablet (80 mg total) by mouth daily. 08/06/17   Alfonse Spruce, FNP  Blood Glucose Monitoring Suppl (TRUE METRIX METER) w/Device KIT 1 each by Does not apply route as needed. 11/28/16   Funches, Adriana Mccallum, MD  cephALEXin (KEFLEX) 500 MG capsule Take 1 capsule (500 mg total) by mouth 4 (four) times daily. 07/31/17   Alfonse Spruce, FNP  gabapentin (NEURONTIN) 300 MG capsule Take 1 capsule (300 mg total) by mouth at bedtime. 07/31/17   Hairston, Maylon Peppers, FNP  glimepiride (AMARYL) 4 MG tablet TAKE 2 TABLETS BY MOUTH DAILY WITH BREAKFAST. 07/31/17   Hairston, Toy Baker R, FNP  glucose blood (TRUE METRIX BLOOD GLUCOSE TEST) test strip 1 each by Other route 3 (three) times daily. 11/28/16   Funches, Adriana Mccallum, MD  Insulin Glargine (LANTUS SOLOSTAR) 100 UNIT/ML Solostar Pen Inject 45 Units into the skin at bedtime. 07/31/17   Alfonse Spruce, FNP  insulin lispro (HUMALOG KWIKPEN) 100 UNIT/ML KiwkPen Less than 70. Do not give insulin. Treat for hypoglycemia 70-139 = 0 units 140-180 = 4 units sbq 181-240 = 6 units sbq 241-300 = 8 units sbq 301-350 = 10 units sbq 351-400 = 12 units sbq Greater than  400 = 14 units; and repeat CBG check in 30 minutes 07/31/17   Alfonse Spruce, FNP  Insulin Pen Needle 31G X 8 MM MISC Use as directed 05/01/17   Ladell Pier, MD  losartan (COZAAR) 100 MG tablet Take 1 tablet (100 mg total) by mouth daily. 11/28/16   Funches, Adriana Mccallum, MD  metFORMIN (GLUCOPHAGE) 1000 MG tablet TAKE 1 TABLET BY MOUTH 2 TIMES DAILY WITH A MEAL. 07/31/17   Alfonse Spruce, FNP  metoCLOPramide (REGLAN) 5 MG tablet Take 1 tablet (5 mg total) by mouth 3 (three) times daily before meals. 11/28/16   Boykin Nearing, MD  Multiple Vitamin (MULTIVITAMIN WITH MINERALS) TABS tablet Take 1 tablet by mouth daily. Reported on 11/13/2015    [provider]  phenytoin (DILANTIN) 100 MG ER capsule TAKE 3 CAPSULES BY MOUTH ONCE A DAY 06/30/17   Ladell Pier, MD  terbinafine (LAMISIL AT) 1 % cream Apply 1 application topically 2 (two) times daily. For 14 days. 07/31/17   Alfonse Spruce, FNP  TRUEPLUS LANCETS 28G MISC 1 each by Does not apply route 3 (three) times daily. 11/28/16   Boykin Nearing, MD  gemfibrozil (LOPID) 600 MG tablet Take 1 tablet (600 mg total) by mouth 2 (two) times daily before a meal. Patient not taking: Reported on 06/17/2015 03/19/15 08/23/15  Lorayne Marek, MD  quinapril-hydrochlorothiazide (ACCURETIC) 20-12.5 MG per tablet Take 1 tablet by mouth daily. Patient not taking: Reported on 06/17/2015 02/20/15 08/23/15  Lorayne Marek, MD    Family History Family History  Problem Relation Age of Onset  . Hypertension Mother   . Diabetes Mother   . Lupus Mother   . Kidney failure Mother   . Diabetes Father   . Hypertension Father   . Kidney failure Father     Social History Social History   Tobacco Use  . Smoking status: Never Smoker  . Smokeless tobacco: Never Used  Substance Use Topics  . Alcohol use: Yes    Alcohol/week: 0.0 oz    Comment: Occasionally throughout year  . Drug use: No     Allergies   Patient has no known allergies.   Review of Systems Review of Systems  Constitutional: Negative for fever.  HENT: Negative for congestion.   Eyes: Positive for visual disturbance.  Respiratory: Negative for shortness of breath.   Cardiovascular: Negative for chest pain.  Gastrointestinal: Negative for abdominal pain.  Neurological: Positive for dizziness and light-headedness. Negative for weakness, numbness and headaches.  All other systems reviewed and are negative.    Physical Exam Updated Vital Signs BP (!) 139/95 (BP Location: Left Arm)    Pulse 88   Temp 98.1 F (36.7 C) (Oral)   Resp 16   Ht '5\' 8"'  (1.727 m)   Wt 112.2 kg (247 lb 5.7 oz)   SpO2 96%   BMI 37.61 kg/m   Physical Exam  Constitutional: He is oriented to person, place, and time. He appears well-developed and well-nourished.  Obese, no acute distress  HENT:  Head: Normocephalic and atraumatic.  Eyes: Pupils are equal, round, and reactive to light.  Cardiovascular: Normal rate, regular rhythm and normal heart sounds.  No murmur heard. Pulmonary/Chest: Effort normal and breath sounds normal. No respiratory distress. He has no wheezes.  Abdominal: Soft. There is no tenderness.  Musculoskeletal: He exhibits no edema.  Neurological: He is alert and oriented to person, place, and time.  Cranial nerves II through XII intact, 5 out of 5 strength  in all 4 extremities, no dysmetria to finger-nose-finger  Skin: Skin is warm and dry.  Psychiatric: He has a normal mood and affect.  Nursing note and vitals reviewed.    ED Treatments / Results  Labs (all labs ordered are listed, but only abnormal results are displayed) Labs Reviewed  CBC WITH DIFFERENTIAL/PLATELET - Abnormal; Notable for the following components:      Result Value   MCHC 36.4 (*)    Neutro Abs 1.4 (*)    All other components within normal limits  BASIC METABOLIC PANEL - Abnormal; Notable for the following components:   Sodium 133 (*)    Chloride 98 (*)    Glucose, Bld 411 (*)    All other components within normal limits  PHENYTOIN LEVEL, TOTAL - Abnormal; Notable for the following components:   Phenytoin Lvl <2.5 (*)    All other components within normal limits  CBG MONITORING, ED - Abnormal; Notable for the following components:   Glucose-Capillary 442 (*)    All other components within normal limits  CBG MONITORING, ED - Abnormal; Notable for the following components:   Glucose-Capillary 388 (*)    All other components within normal limits  CBG MONITORING, ED - Abnormal; Notable for  the following components:   Glucose-Capillary 331 (*)    All other components within normal limits  CBG MONITORING, ED - Abnormal; Notable for the following components:   Glucose-Capillary 244 (*)    All other components within normal limits    EKG  EKG Interpretation None       Radiology No results found.  Procedures Procedures (including critical care time)  Medications Ordered in ED Medications  insulin glargine (LANTUS) 100 UNIT/ML injection (not administered)  sodium chloride 0.9 % bolus 1,000 mL (1,000 mLs Intravenous New Bag/Given 12/06/17 2322)  insulin aspart (novoLOG) injection 10 Units (10 Units Subcutaneous Given 12/06/17 2330)  sodium chloride 0.9 % bolus 1,000 mL (1,000 mLs Intravenous New Bag/Given 12/07/17 0122)  insulin glargine (LANTUS) injection 45 Units (45 Units Subcutaneous Given 12/07/17 0122)     Initial Impression / Assessment and Plan / ED Course  I have reviewed the triage vital signs and the nursing notes.  Pertinent labs & imaging results that were available during my care of the patient were reviewed by me and considered in my medical decision making (see chart for details).  Clinical Course as of Dec 07 234  Mon Dec 07, 2017  0114 Feels much better.  Persistently elevated blood sugar.  Patient given nightly Lantus and 1 additional liter of fluids.  [CH]    Clinical Course User Index [CH] Raney Koeppen, Barbette Hair, MD   Patient presents with hyperglycemia and dizziness.  He is overall nontoxic appearing.  Vital signs notable for blood pressure of 139/95.  He is neurologically intact.  States his symptoms are similar to prior episodes of hypoglycemia.  Lab work obtained.  No anion gap.  Patient was initially given insulin and 1 L of fluids.  Lab work notable for a subtherapeutic phenytoin level but otherwise fairly reassuring with exception of hyperglycemia.  On recheck he feels much better but persistently elevated sugars.  Redosed 1 L fluids and his  nightly Lantus.  Recommend that he comply with daily insulin regimen and keep a close log of his blood sugars.  He may need adjustment.  After history, exam, and medical workup I feel the patient has been appropriately medically screened and is safe for discharge home. Pertinent diagnoses were discussed  with the patient. Patient was given return precautions.   Final Clinical Impressions(s) / ED Diagnoses   Final diagnoses:  Hyperglycemia  Dizziness    ED Discharge Orders    None       Florencia Zaccaro, Barbette Hair, MD 12/07/17 [phone]

## 2017-12-07 NOTE — Discharge Instructions (Signed)
You were seen today for high blood sugars.  Make sure that you take your insulin as directed and monitor your blood sugars closely.  Follow-up with your primary physician for medication adjustment.

## 2017-12-14 ENCOUNTER — Ambulatory Visit: Payer: BLUE CROSS/BLUE SHIELD | Attending: Family Medicine | Admitting: Licensed Clinical Social Worker

## 2017-12-14 ENCOUNTER — Ambulatory Visit: Payer: BLUE CROSS/BLUE SHIELD | Attending: Family Medicine | Admitting: Family Medicine

## 2017-12-14 ENCOUNTER — Encounter: Payer: Self-pay | Admitting: Family Medicine

## 2017-12-14 VITALS — BP 162/106 | HR 92 | Temp 98.4°F | Resp 16 | Wt 256.0 lb

## 2017-12-14 DIAGNOSIS — E1165 Type 2 diabetes mellitus with hyperglycemia: Secondary | ICD-10-CM | POA: Diagnosis not present

## 2017-12-14 DIAGNOSIS — Z79899 Other long term (current) drug therapy: Secondary | ICD-10-CM | POA: Diagnosis not present

## 2017-12-14 DIAGNOSIS — N529 Male erectile dysfunction, unspecified: Secondary | ICD-10-CM | POA: Insufficient documentation

## 2017-12-14 DIAGNOSIS — F418 Other specified anxiety disorders: Secondary | ICD-10-CM

## 2017-12-14 DIAGNOSIS — E1140 Type 2 diabetes mellitus with diabetic neuropathy, unspecified: Secondary | ICD-10-CM | POA: Diagnosis not present

## 2017-12-14 DIAGNOSIS — I10 Essential (primary) hypertension: Secondary | ICD-10-CM | POA: Diagnosis not present

## 2017-12-14 DIAGNOSIS — G40909 Epilepsy, unspecified, not intractable, without status epilepticus: Secondary | ICD-10-CM | POA: Insufficient documentation

## 2017-12-14 DIAGNOSIS — E1169 Type 2 diabetes mellitus with other specified complication: Secondary | ICD-10-CM

## 2017-12-14 DIAGNOSIS — Z7982 Long term (current) use of aspirin: Secondary | ICD-10-CM | POA: Insufficient documentation

## 2017-12-14 DIAGNOSIS — E1110 Type 2 diabetes mellitus with ketoacidosis without coma: Secondary | ICD-10-CM | POA: Insufficient documentation

## 2017-12-14 DIAGNOSIS — N521 Erectile dysfunction due to diseases classified elsewhere: Secondary | ICD-10-CM

## 2017-12-14 DIAGNOSIS — E113591 Type 2 diabetes mellitus with proliferative diabetic retinopathy without macular edema, right eye: Secondary | ICD-10-CM | POA: Insufficient documentation

## 2017-12-14 DIAGNOSIS — E785 Hyperlipidemia, unspecified: Secondary | ICD-10-CM

## 2017-12-14 DIAGNOSIS — R809 Proteinuria, unspecified: Secondary | ICD-10-CM

## 2017-12-14 DIAGNOSIS — Z794 Long term (current) use of insulin: Secondary | ICD-10-CM | POA: Diagnosis not present

## 2017-12-14 DIAGNOSIS — IMO0002 Reserved for concepts with insufficient information to code with codable children: Secondary | ICD-10-CM

## 2017-12-14 DIAGNOSIS — E1129 Type 2 diabetes mellitus with other diabetic kidney complication: Secondary | ICD-10-CM | POA: Insufficient documentation

## 2017-12-14 LAB — GLUCOSE, POCT (MANUAL RESULT ENTRY)
POC Glucose: 301 mg/dl — AB (ref 70–99)
POC Glucose: 251 mg/dl — AB (ref 70–99)

## 2017-12-14 MED ORDER — INSULIN GLARGINE 100 UNIT/ML SOLOSTAR PEN: 45.0000 [IU] | PEN_INJECTOR | Freq: Every day | SUBCUTANEOUS | 5 refills | Status: DC

## 2017-12-14 MED ORDER — GLIPIZIDE 5 MG PO TABS: 5.0000 mg | ORAL_TABLET | Freq: Every day | ORAL | 2 refills | Status: DC

## 2017-12-14 MED ORDER — GABAPENTIN 300 MG PO CAPS: 300.0000 mg | ORAL_CAPSULE | Freq: Every day | ORAL | 2 refills | Status: DC

## 2017-12-14 MED ORDER — SITAGLIPTIN PHOS-METFORMIN HCL 50-1000 MG PO TABS: 1.0000 | ORAL_TABLET | Freq: Two times a day (BID) | ORAL | 2 refills | Status: DC

## 2017-12-14 MED ORDER — LOSARTAN POTASSIUM 100 MG PO TABS: 100.0000 mg | ORAL_TABLET | Freq: Every day | ORAL | 5 refills | Status: DC

## 2017-12-14 MED ORDER — SILDENAFIL CITRATE 50 MG PO TABS: 25.0000 mg | ORAL_TABLET | Freq: Every day | ORAL | 6 refills | Status: DC | PRN

## 2017-12-14 MED ORDER — AMLODIPINE BESYLATE 10 MG PO TABS: 10.0000 mg | ORAL_TABLET | Freq: Every day | ORAL | 2 refills | Status: DC

## 2017-12-14 MED ORDER — INSULIN ASPART 100 UNIT/ML ~~LOC~~ SOLN
8.0000 [IU] | Freq: Once | SUBCUTANEOUS | Status: AC
  Administered 2017-12-14: 8 [IU] via SUBCUTANEOUS

## 2017-12-14 MED ORDER — ATORVASTATIN CALCIUM 80 MG PO TABS: 80.0000 mg | ORAL_TABLET | Freq: Every day | ORAL | 2 refills | Status: DC

## 2017-12-14 MED FILL — !VIAGRA 50 MG TABLET: 50 | 9 days supply | Qty: 3 | Fill #0

## 2017-12-14 MED FILL — ATORVASTATIN 80 MG TABLET: 80 | 30 days supply | Qty: 30 | Fill #0

## 2017-12-14 MED FILL — LANTUS SOLOSTAR 100 UNITS/M: 100 | 20 days supply | Qty: 9 | Fill #0

## 2017-12-14 MED FILL — GABAPENTIN 300 MG CAPSULE: 300 | 30 days supply | Qty: 30 | Fill #0

## 2017-12-14 MED FILL — glipiZIDE 5 MG TABS: 5 | 30 days supply | Qty: 30 | Fill #0 | Status: TO

## 2017-12-14 MED FILL — AMLODIPINE BESYLATE 10 MG T: 10 | 30 days supply | Qty: 30 | Fill #0

## 2017-12-14 MED FILL — LOSARTAN POTASSIUM 100 MG T: 100 | 30 days supply | Qty: 30 | Fill #0
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
## 2017-12-14 NOTE — Progress Notes (Signed)
CBGFollow up visit from ED 1 week:  Hyperglycemic episode at work  Only taking Lantus Diarrhea on metformin, does not digest Discuss medication and hyperglycemia Has not taken medication this morning
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
## 2017-12-15 LAB — LIPID PANEL
CHOL/HDL RATIO: 2.9 ratio (ref 0.0–5.0)
Cholesterol, Total: 120 mg/dL (ref 100–199)
HDL: 42 mg/dL (ref >39)
LDL CALC: 50 mg/dL (ref 0–99)
TRIGLYCERIDES: 141 mg/dL (ref 0–149)
VLDL Cholesterol Cal: 28 mg/dL (ref 5–40)

## 2017-12-15 LAB — HEMOGLOBIN A1C
ESTIMATED AVERAGE GLUCOSE: 272 mg/dL
Hgb A1c MFr Bld: 11.1 % — ABNORMAL HIGH (ref 4.8–5.6)

## 2017-12-15 LAB — PHENYTOIN LEVEL, TOTAL: Phenytoin (Dilantin), Serum: 5.3 ug/mL — ABNORMAL LOW (ref 10.0–20.0)

## 2017-12-16 ENCOUNTER — Other Ambulatory Visit: Payer: Self-pay | Admitting: Internal Medicine

## 2017-12-16 DIAGNOSIS — G40909 Epilepsy, unspecified, not intractable, without status epilepticus: Secondary | ICD-10-CM

## 2017-12-16 MED FILL — AMLODIPINE BESYLATE 5 MG TA: 5 | 30 days supply | Qty: 30 | Fill #2

## 2017-12-18 MED FILL — PHENYTOIN SOD EXT 100 MG CA: 100 | 30 days supply | Qty: 120 | Fill #0 | Status: TO

## 2017-12-18 MED FILL — JANUMET 50-1,000 MG TABLET: 50-1000 | 30 days supply | Qty: 60 | Fill #0

## 2017-12-21 ENCOUNTER — Telehealth: Payer: Self-pay

## 2017-12-21 NOTE — Telephone Encounter (Signed)
CMA spoke to patient to inform on lab results and PCP advising.   Patient understood.   

## 2017-12-21 NOTE — Telephone Encounter (Signed)
-----   Message from Lizbeth BarkMandesia R Hairston, FNP sent at 12/17/2017  5:30 PM EST ----- Your dosage of phenytoin will be increased. Recommend f/u in 2 weeks for lab only visit to re-evaluate levels.  Cholesterol levels controlled on current dose of atorvastatin. Diabetes screening is not at goal. Hgba1c is 11.1, goal is less than 7. Take diabetic medications consistently.  -Continue to take your diabetic medications. -Start eating a carbohydrate modified diet. Avoid eating large amounts of starches, white bread, rice, and sugar. -Keep a log of your blood sugar levels to bring to your next office visit.

## 2017-12-29 ENCOUNTER — Ambulatory Visit: Payer: BLUE CROSS/BLUE SHIELD | Attending: Family Medicine | Admitting: Family Medicine

## 2017-12-29 ENCOUNTER — Encounter: Payer: Self-pay | Admitting: Pharmacist

## 2017-12-29 VITALS — BP 120/83 | HR 99 | Temp 97.9°F | Ht 68.0 in | Wt 264.4 lb

## 2017-12-29 DIAGNOSIS — F418 Other specified anxiety disorders: Secondary | ICD-10-CM

## 2017-12-29 DIAGNOSIS — Z79899 Other long term (current) drug therapy: Secondary | ICD-10-CM | POA: Insufficient documentation

## 2017-12-29 DIAGNOSIS — Z794 Long term (current) use of insulin: Secondary | ICD-10-CM | POA: Diagnosis not present

## 2017-12-29 DIAGNOSIS — E1140 Type 2 diabetes mellitus with diabetic neuropathy, unspecified: Secondary | ICD-10-CM | POA: Insufficient documentation

## 2017-12-29 DIAGNOSIS — E1165 Type 2 diabetes mellitus with hyperglycemia: Secondary | ICD-10-CM | POA: Diagnosis not present

## 2017-12-29 DIAGNOSIS — Z7982 Long term (current) use of aspirin: Secondary | ICD-10-CM | POA: Insufficient documentation

## 2017-12-29 DIAGNOSIS — E119 Type 2 diabetes mellitus without complications: Secondary | ICD-10-CM | POA: Diagnosis present

## 2017-12-29 DIAGNOSIS — F329 Major depressive disorder, single episode, unspecified: Secondary | ICD-10-CM | POA: Diagnosis not present

## 2017-12-29 DIAGNOSIS — R569 Unspecified convulsions: Secondary | ICD-10-CM | POA: Diagnosis not present

## 2017-12-29 DIAGNOSIS — Z029 Encounter for administrative examinations, unspecified: Secondary | ICD-10-CM | POA: Diagnosis not present

## 2017-12-29 DIAGNOSIS — I10 Essential (primary) hypertension: Secondary | ICD-10-CM | POA: Insufficient documentation

## 2017-12-29 DIAGNOSIS — F419 Anxiety disorder, unspecified: Secondary | ICD-10-CM | POA: Insufficient documentation

## 2017-12-29 DIAGNOSIS — IMO0002 Reserved for concepts with insufficient information to code with codable children: Secondary | ICD-10-CM

## 2017-12-29 LAB — GLUCOSE, POCT (MANUAL RESULT ENTRY): POC Glucose: 143 mg/dl — AB (ref 70–99)

## 2017-12-29 MED ORDER — INSULIN LISPRO 100 UNIT/ML (KWIKPEN): PEN_INJECTOR | SUBCUTANEOUS | 2 refills | Status: DC

## 2017-12-29 MED FILL — HUMALOG 100 UNITS/ML KWIKPE: 100 | 30 days supply | Qty: 9 | Fill #0
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
## 2017-12-29 NOTE — Progress Notes (Signed)
Patient has FMLA paperwork. 

## 2017-12-29 NOTE — Patient Instructions (Addendum)
Thanks for coming to see us!  Restart the Humalog with dinner. Same sliding scale as before  Stop the glipizide.  Make an appointment with new PCP for March visit

## 2017-12-29 NOTE — Progress Notes (Signed)
    S:     No chief complaint on file.   Patient arrives in good spirits.  Presents for diabetes evaluation, education, and management.  Patient reports adherence with medications.  Current diabetes medications include: glipizide 5 mg daily, Janumet 50-1000 mg BID, Lantus 45 units BID (prescribed as daily)  Current hypertension medications include: amlodipine 10 mg daily, losartan 100 mg daily   Patient denies hypoglycemic events.  Patient reported dietary habits: drinks water throughout the day, trying to eat more greens and less fried foods and potatoes.  Patient reports significant stress with family but reports that he feels like he is doing a good job managing it.    O:  Physical Exam   ROS   Lab Results  Component Value Date   HGBA1C 11.1 (H) 12/14/2017   There were no vitals filed for this visit.  Home fasting CBG: 100s-230s 2 hour post-prandial/random CBG: 200s-300s  POCT fasting = 143   A/P: Diabetes longstanding currently uncontrolled based on A1c of 11.1. Patient denies hypoglycemic events and is able to verbalize appropriate hypoglycemia management plan. Patient reports adherence with medication. Control is suboptimal due to previous nonadherence to medications and sedentary lifestyle.  His blood sugars are much more elevated in the evenings. Patient to stop glipizide and restart Humalog. Will likely need to be titrated up on the Humalog but will start with the SSI that he has used previously. Patient agreeable to this plan. Will not change Lantus at this time, and am hoping to get more of 50/50 split between the short acting and long acting insulins as this is provide him with better control.   Next A1C anticipated May 2019.    Hypertension longstanding currently controlled on current medications.  Patient reports adherence with medication. No changes needed to medications at this time.  Written patient instructions provided.  Total time in face to face  counseling 20 minutes.   Follow up in Pharmacist Clinic Visit PRN, next visit needed with new PCP.   Patient seen with Karna DupesMedina Rasul and Mike CrazeShirley Guo, PharmD Candidates

## 2017-12-31 ENCOUNTER — Encounter: Payer: Self-pay | Admitting: *Deleted

## 2017-12-31 LAB — POCT ABI - SCREENING FOR PILOT NO CHARGE: Other: NEGATIVE

## 2018-01-13 ENCOUNTER — Other Ambulatory Visit: Payer: Self-pay | Admitting: Family Medicine

## 2018-01-13 ENCOUNTER — Other Ambulatory Visit: Payer: Self-pay | Admitting: Pharmacist

## 2018-01-13 DIAGNOSIS — E1165 Type 2 diabetes mellitus with hyperglycemia: Secondary | ICD-10-CM

## 2018-01-13 DIAGNOSIS — E118 Type 2 diabetes mellitus with unspecified complications: Principal | ICD-10-CM

## 2018-01-13 DIAGNOSIS — E0840 Diabetes mellitus due to underlying condition with diabetic neuropathy, unspecified: Secondary | ICD-10-CM

## 2018-01-13 DIAGNOSIS — Z794 Long term (current) use of insulin: Principal | ICD-10-CM

## 2018-01-13 DIAGNOSIS — E0865 Diabetes mellitus due to underlying condition with hyperglycemia: Secondary | ICD-10-CM

## 2018-01-13 DIAGNOSIS — IMO0002 Reserved for concepts with insufficient information to code with codable children: Secondary | ICD-10-CM

## 2018-01-13 MED ORDER — ONETOUCH DELICA LANCETS 33G MISC: 1.0000 | Freq: Three times a day (TID) | 2 refills | Status: AC

## 2018-01-13 MED ORDER — GLUCOSE BLOOD VI STRP: 1.0000 | ORAL_STRIP | Freq: Three times a day (TID) | 11 refills | Status: DC

## 2018-01-13 MED ORDER — GLUCOSE BLOOD VI STRP: ORAL_STRIP | 2 refills | Status: AC

## 2018-01-13 MED FILL — ONE TOUCH ULTRA TEST STRIPS: 30 days supply | Qty: 100 | Fill #0 | Status: TO

## 2018-01-13 MED FILL — JANUMET 50-1,000 MG TABLET: 50-1000 | 30 days supply | Qty: 60 | Fill #1

## 2018-01-13 MED FILL — PHENYTOIN SOD EXT 100 MG CA: 100 | 30 days supply | Qty: 120 | Fill #1 | Status: TO

## 2018-01-13 MED FILL — GABAPENTIN 300 MG CAPSULE: 300 | 30 days supply | Qty: 30 | Fill #1

## 2018-01-13 MED FILL — LOSARTAN POTASSIUM 100 MG T: 100 | 30 days supply | Qty: 30 | Fill #1

## 2018-01-13 MED FILL — AMLODIPINE BESYLATE 10 MG T: 10 | 30 days supply | Qty: 30 | Fill #1

## 2018-01-13 MED FILL — ATORVASTATIN 80 MG TABLET: 80 | 30 days supply | Qty: 30 | Fill #1

## 2018-01-14 ENCOUNTER — Other Ambulatory Visit: Payer: Self-pay | Admitting: Family Medicine

## 2018-01-14 MED ORDER — ONETOUCH ULTRA 2 W/DEVICE KIT
PACK | 0 refills | Status: DC
Start: 2018-01-14 — End: 2020-05-23

## 2018-01-14 MED FILL — ONE TOUCH ULTRAMINI METER: W/DEVICE | 30 days supply | Qty: 1 | Fill #0

## 2018-01-14 MED FILL — ONE TOUCH DELICA 33G LANCET: 30 days supply | Qty: 100 | Fill #0 | Status: TO

## 2018-01-15 MED FILL — LANTUS SOLOSTAR 100 UNITS/M: 100 | 20 days supply | Qty: 9 | Fill #1

## 2018-02-09 ENCOUNTER — Telehealth: Payer: Self-pay | Admitting: Family Medicine

## 2018-02-09 NOTE — Telephone Encounter (Signed)
3 page, paperwork received through fax 02-09-18.

## 2018-02-11 MED FILL — ATORVASTATIN 80 MG TABLET: 80 | 30 days supply | Qty: 30 | Fill #2

## 2018-02-11 MED FILL — GABAPENTIN 300 MG CAPSULE: 300 | 30 days supply | Qty: 30 | Fill #2

## 2018-02-11 MED FILL — AMLODIPINE BESYLATE 10 MG T: 10 | 30 days supply | Qty: 30 | Fill #2

## 2018-02-18 ENCOUNTER — Ambulatory Visit: Payer: BLUE CROSS/BLUE SHIELD | Attending: Internal Medicine | Admitting: Internal Medicine

## 2018-02-18 ENCOUNTER — Encounter: Payer: Self-pay | Admitting: Internal Medicine

## 2018-02-18 VITALS — BP 163/105 | HR 93 | Temp 98.5°F | Resp 16 | Ht 68.0 in | Wt 270.0 lb

## 2018-02-18 DIAGNOSIS — Z8249 Family history of ischemic heart disease and other diseases of the circulatory system: Secondary | ICD-10-CM | POA: Insufficient documentation

## 2018-02-18 DIAGNOSIS — E785 Hyperlipidemia, unspecified: Secondary | ICD-10-CM | POA: Insufficient documentation

## 2018-02-18 DIAGNOSIS — Z833 Family history of diabetes mellitus: Secondary | ICD-10-CM | POA: Diagnosis not present

## 2018-02-18 DIAGNOSIS — R600 Localized edema: Secondary | ICD-10-CM | POA: Insufficient documentation

## 2018-02-18 DIAGNOSIS — Z7982 Long term (current) use of aspirin: Secondary | ICD-10-CM | POA: Insufficient documentation

## 2018-02-18 DIAGNOSIS — E669 Obesity, unspecified: Secondary | ICD-10-CM | POA: Diagnosis not present

## 2018-02-18 DIAGNOSIS — N529 Male erectile dysfunction, unspecified: Secondary | ICD-10-CM | POA: Diagnosis not present

## 2018-02-18 DIAGNOSIS — Z6841 Body Mass Index (BMI) 40.0 and over, adult: Secondary | ICD-10-CM | POA: Insufficient documentation

## 2018-02-18 DIAGNOSIS — I10 Essential (primary) hypertension: Secondary | ICD-10-CM | POA: Diagnosis not present

## 2018-02-18 DIAGNOSIS — Z794 Long term (current) use of insulin: Secondary | ICD-10-CM

## 2018-02-18 DIAGNOSIS — G40909 Epilepsy, unspecified, not intractable, without status epilepticus: Secondary | ICD-10-CM | POA: Insufficient documentation

## 2018-02-18 DIAGNOSIS — E1165 Type 2 diabetes mellitus with hyperglycemia: Secondary | ICD-10-CM | POA: Insufficient documentation

## 2018-02-18 DIAGNOSIS — F329 Major depressive disorder, single episode, unspecified: Secondary | ICD-10-CM | POA: Diagnosis not present

## 2018-02-18 DIAGNOSIS — E118 Type 2 diabetes mellitus with unspecified complications: Secondary | ICD-10-CM | POA: Diagnosis not present

## 2018-02-18 DIAGNOSIS — Z8269 Family history of other diseases of the musculoskeletal system and connective tissue: Secondary | ICD-10-CM | POA: Insufficient documentation

## 2018-02-18 DIAGNOSIS — IMO0002 Reserved for concepts with insufficient information to code with codable children: Secondary | ICD-10-CM

## 2018-02-18 DIAGNOSIS — Z79899 Other long term (current) drug therapy: Secondary | ICD-10-CM | POA: Diagnosis not present

## 2018-02-18 DIAGNOSIS — F32A Depression, unspecified: Secondary | ICD-10-CM

## 2018-02-18 DIAGNOSIS — Z841 Family history of disorders of kidney and ureter: Secondary | ICD-10-CM | POA: Diagnosis not present

## 2018-02-18 DIAGNOSIS — E1140 Type 2 diabetes mellitus with diabetic neuropathy, unspecified: Secondary | ICD-10-CM

## 2018-02-18 LAB — GLUCOSE, POCT (MANUAL RESULT ENTRY): POC GLUCOSE: 193 mg/dL — AB (ref 70–99)

## 2018-02-18 LAB — POCT GLYCOSYLATED HEMOGLOBIN (HGB A1C): HEMOGLOBIN A1C: 9

## 2018-02-18 MED ORDER — INSULIN LISPRO 100 UNIT/ML (KWIKPEN): 23.0000 [IU] | PEN_INJECTOR | Freq: Three times a day (TID) | SUBCUTANEOUS | 2 refills | Status: DC

## 2018-02-18 MED ORDER — LISINOPRIL 10 MG PO TABS: 10.0000 mg | ORAL_TABLET | Freq: Every day | ORAL | 3 refills | Status: DC

## 2018-02-18 MED ORDER — INSULIN GLARGINE 100 UNIT/ML SOLOSTAR PEN: 50.0000 [IU] | PEN_INJECTOR | Freq: Every day | SUBCUTANEOUS | 5 refills | Status: DC

## 2018-02-18 MED ORDER — HYDROCHLOROTHIAZIDE 25 MG PO TABS: 25.0000 mg | ORAL_TABLET | Freq: Every day | ORAL | 3 refills | Status: DC

## 2018-02-18 MED FILL — HYDROCHLOROTHIAZIDE 25 MG T: 25 | 30 days supply | Qty: 30 | Fill #0

## 2018-02-18 MED FILL — HUMALOG 100 UNITS/ML KWIKPE: 100 | 30 days supply | Qty: 9 | Fill #1

## 2018-02-18 MED FILL — LISINOPRIL 10 MG TABS: 10 | 30 days supply | Qty: 30 | Fill #0

## 2018-02-18 NOTE — Patient Instructions (Addendum)
[facility]  Stop Cozaar.  Start lisinopril 10 mg daily instead. Start hydrochlorothiazide 25 mg daily to help better control your blood pressure and decrease the swelling in the legs. After you have been on these new medications for 1 week, please return to the lab to have kidney and potassium level check.  Please limit the salt in the foods.  You should no drive until you have been seizure free for 6 months.  I have referred you to a neurologist.   Your diabetes is not well controlled.  Increase Lantus to 50 units daily.  Increase Humalog to 23 units with meals.  Please cut back on eating fast foods and drinking regular sodas.

## 2018-02-18 NOTE — Progress Notes (Signed)
Pt states his foot is swollen to where he can't hardly wal  Pt is requesting 2 referrals dentist and eye
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
## 2018-02-19 ENCOUNTER — Telehealth: Payer: Self-pay | Admitting: Internal Medicine

## 2018-02-19 ENCOUNTER — Other Ambulatory Visit: Payer: Self-pay | Admitting: Internal Medicine

## 2018-02-19 DIAGNOSIS — G40909 Epilepsy, unspecified, not intractable, without status epilepticus: Secondary | ICD-10-CM

## 2018-02-19 DIAGNOSIS — R945 Abnormal results of liver function studies: Secondary | ICD-10-CM

## 2018-02-19 DIAGNOSIS — I10 Essential (primary) hypertension: Secondary | ICD-10-CM

## 2018-02-19 DIAGNOSIS — R7989 Other specified abnormal findings of blood chemistry: Secondary | ICD-10-CM

## 2018-02-19 LAB — COMPREHENSIVE METABOLIC PANEL
ALT: 70 IU/L — AB (ref 0–44)
AST: 34 IU/L (ref 0–40)
Albumin/Globulin Ratio: 1.5 (ref 1.2–2.2)
Albumin: 4.4 g/dL (ref 3.5–5.5)
Alkaline Phosphatase: 63 IU/L (ref 39–117)
BUN/Creatinine Ratio: 19 (ref 9–20)
BUN: 13 mg/dL (ref 6–24)
Bilirubin Total: 0.3 mg/dL (ref 0.0–1.2)
CALCIUM: 9.5 mg/dL (ref 8.7–10.2)
CO2: 27 mmol/L (ref 20–29)
CREATININE: 0.7 mg/dL — AB (ref 0.76–1.27)
Chloride: 102 mmol/L (ref 96–106)
GFR calc non Af Amer: 113 mL/min/{1.73_m2} (ref >59)
GFR, EST AFRICAN AMERICAN: 131 mL/min/{1.73_m2} (ref >59)
GLUCOSE: 180 mg/dL — AB (ref 65–99)
Globulin, Total: 3 g/dL (ref 1.5–4.5)
Potassium: 4.1 mmol/L (ref 3.5–5.2)
Sodium: 142 mmol/L (ref 134–144)
TOTAL PROTEIN: 7.4 g/dL (ref 6.0–8.5)

## 2018-02-19 LAB — BRAIN NATRIURETIC PEPTIDE: BNP: 11 pg/mL (ref 0.0–100.0)

## 2018-02-19 LAB — MICROALBUMIN / CREATININE URINE RATIO
Creatinine, Urine: 121.8 mg/dL
MICROALBUM., U, RANDOM: 793 ug/mL
Microalb/Creat Ratio: 651.1 mg/g creat — ABNORMAL HIGH (ref 0.0–30.0)

## 2018-02-19 LAB — PHENYTOIN LEVEL, TOTAL: PHENYTOIN (DILANTIN), SERUM: 4.5 ug/mL — AB (ref 10.0–20.0)

## 2018-02-19 LAB — TSH: TSH: 4.09 u[IU]/mL (ref 0.450–4.500)

## 2018-02-19 MED ORDER — PHENYTOIN SODIUM EXTENDED 300 MG PO CAPS: 300.0000 mg | ORAL_CAPSULE | Freq: Two times a day (BID) | ORAL | 3 refills | Status: DC

## 2018-02-19 NOTE — Telephone Encounter (Signed)
Received fax from [facility]  Requesting for medical support disability, fax will on the pcp in-box

## 2018-02-19 NOTE — Telephone Encounter (Signed)
PC placed to pt this evening.  Pt informed that Dilantin level  Is subtherapeutic.  He confirms that he is taking 200 mg BID and has not skipped any doses in the last 2-3 wks.  He has not taken his evening dose as yet for today.  I recommend taking 400 mg tonight then increase to 300 mg BID starting tomorrow.  Return to lab next wk for repeat level. Pt expressed understanding and was able to repeat back the instructions. Pt also informed of elevation in one of his LFTs.  Could be due to statin.  Screen for hep C.  Kidney function good.  TSH and BNP nl. Pt with macroalbuminuria.  Changed from Cozaar to Lisinopril yesterday.  Will recheck urine alb/creat ratio in several wks

## 2018-02-24 ENCOUNTER — Ambulatory Visit: Payer: BLUE CROSS/BLUE SHIELD

## 2018-02-24 ENCOUNTER — Telehealth: Payer: Self-pay | Admitting: Internal Medicine

## 2018-02-24 ENCOUNTER — Ambulatory Visit: Payer: BLUE CROSS/BLUE SHIELD | Attending: Family Medicine | Admitting: Licensed Clinical Social Worker

## 2018-02-24 ENCOUNTER — Other Ambulatory Visit: Payer: Self-pay

## 2018-02-24 DIAGNOSIS — E559 Vitamin D deficiency, unspecified: Secondary | ICD-10-CM | POA: Diagnosis not present

## 2018-02-24 DIAGNOSIS — I10 Essential (primary) hypertension: Secondary | ICD-10-CM

## 2018-02-24 DIAGNOSIS — G40909 Epilepsy, unspecified, not intractable, without status epilepticus: Secondary | ICD-10-CM

## 2018-02-24 DIAGNOSIS — R945 Abnormal results of liver function studies: Secondary | ICD-10-CM

## 2018-02-24 DIAGNOSIS — R7989 Other specified abnormal findings of blood chemistry: Secondary | ICD-10-CM

## 2018-02-24 DIAGNOSIS — E1165 Type 2 diabetes mellitus with hyperglycemia: Principal | ICD-10-CM

## 2018-02-24 DIAGNOSIS — E1140 Type 2 diabetes mellitus with diabetic neuropathy, unspecified: Secondary | ICD-10-CM

## 2018-02-24 DIAGNOSIS — F419 Anxiety disorder, unspecified: Secondary | ICD-10-CM

## 2018-02-24 DIAGNOSIS — IMO0002 Reserved for concepts with insufficient information to code with codable children: Secondary | ICD-10-CM

## 2018-02-24 MED ORDER — SITAGLIPTIN PHOS-METFORMIN HCL 50-1000 MG PO TABS: 1.0000 | ORAL_TABLET | Freq: Two times a day (BID) | ORAL | 0 refills | Status: DC

## 2018-02-24 MED FILL — LANTUS SOLOSTAR 100 UNITS/M: 100 | 30 days supply | Qty: 15 | Fill #0 | Status: TO

## 2018-02-24 NOTE — BH Specialist Note (Signed)
Integrated Behavioral Health Follow Up Visit  MRN: [id number] Name: Albert Garcia  Number of Integrated Behavioral Health Clinician visits: 4/6 Session Start time: 9:30 AM  Session End time: 10:00 AM Total time: 30 minutes  Type of Service: Integrated Behavioral Health- Individual/Family Interpretor:No. Interpretor Name and Language: N.A  SUBJECTIVE: Albert Garcia is a 47 y.o. male accompanied by self Patient was referred by FNP [place] for depression and anxiety. Patient reports the following symptoms/concerns: feelings of sadness and worry, difficulty sleeping, low energy, difficulty relaxing, feeling bad about self, and irritability Duration of problem: Over 6 months; Severity of problem: moderate  OBJECTIVE: Mood: Anxious and Pleasant and Affect: Appropriate Risk of harm to self or others: No plan to harm self or others  LIFE CONTEXT: Family and Social: Pt has five sons and one daughter. He receives support from family, friends, and partner School/Work: Pt is employed; however, is currently on short term-leave due to medical concerns Self-Care: No report of substance use. Pt is starting an exercise regimen to assist in improving mood and energy Life Changes: Pt has been placed on short term disability, which may be transitioned to long term due to PCP placing him on driving restrictions. Pt is anxious about finances and health  GOALS ADDRESSED: Patient will: 1.  Reduce symptoms of: agitation, anxiety, depression and stress  2.  Increase knowledge and/or ability of: coping skills and stress reduction  3.  Demonstrate ability to: Increase healthy adjustment to current life circumstances and Increase adequate support systems for patient/family  INTERVENTIONS: Interventions utilized:  Solution-Focused Strategies, Supportive Counseling and Link to [facility] Standardized Assessments completed: Not Needed  ASSESSMENT: Patient currently experiencing increased stress,  depression, and anxiety triggered by financial strain and medical conditions that resulted in pt being placed on short term disability from employer. PCP placed him on driving restrictions which impairs pt's ability to work; therefore, he may have to transition to long-term disability for the next six months. Pt reports feelings of sadness and worry, difficulty sleeping, low energy, difficulty relaxing, feeling bad about self, and irritability.   LCSWA directed pt to reflect on previous accomplishments to assist pt in identifying his strengths. Pt highlighted healthy strategies used in overcoming barriers that he can currently use to assist him in adjusting to being out of work. Free community resources were provided to pt to assist him with exercise goals.   PLAN: 1. Follow up with behavioral health clinician on : Pt was encouraged tocontact LCSWA if symptoms worsen or fail to improveto schedule behavioral appointments at [facility]. 2. Behavioral recommendations: LCSWA recommends that pt apply healthy coping skills discussed and utilize provided resources. Pt is encouraged to schedule follow up appointment with LCSWA 3. Referral(s): Integrated [facility] (In Clinic) 4. "From scale of 1-10, how likely are you to follow plan?": 10/10  Bridgett Larsson, LCSW 02/26/18 5:03 PM

## 2018-02-24 NOTE — Addendum Note (Signed)
Addended by: Paschal Dopp on: 02/24/2018 11:34 AM   Modules accepted: Orders

## 2018-02-24 NOTE — Telephone Encounter (Signed)
-----   Message from Paschal Dopp sent at 02/24/2018 10:03 AM EDT ----- Mr. Albert Garcia would like to know if he can have his vitamin D checked with the labs that were drawn today. If it is possible please add order so I can send it out today

## 2018-02-24 NOTE — Progress Notes (Signed)
Patient here for lab visit only 

## 2018-02-25 ENCOUNTER — Other Ambulatory Visit: Payer: Self-pay

## 2018-02-25 ENCOUNTER — Telehealth: Payer: Self-pay | Admitting: Internal Medicine

## 2018-02-25 DIAGNOSIS — G40909 Epilepsy, unspecified, not intractable, without status epilepticus: Secondary | ICD-10-CM

## 2018-02-25 LAB — POTASSIUM: POTASSIUM: 3.9 mmol/L (ref 3.5–5.2)

## 2018-02-25 LAB — PHENYTOIN LEVEL, TOTAL: Phenytoin (Dilantin), Serum: 6.5 ug/mL — ABNORMAL LOW (ref 10.0–20.0)

## 2018-02-25 LAB — HEPATITIS C ANTIBODY: Hep C Virus Ab: <0.1 s/co ratio (ref 0.0–0.9)

## 2018-02-25 MED ORDER — PHENYTOIN SODIUM EXTENDED 300 MG PO CAPS: 300.0000 mg | ORAL_CAPSULE | Freq: Two times a day (BID) | ORAL | 3 refills | Status: DC

## 2018-02-25 NOTE — Telephone Encounter (Signed)
Resent rx to [facility]

## 2018-02-25 NOTE — Telephone Encounter (Signed)
Patient called requesting a refill om Phenytoin  refill please and let me know so I can call the patient back.

## 2018-02-26 ENCOUNTER — Telehealth: Payer: Self-pay | Admitting: Internal Medicine

## 2018-02-26 DIAGNOSIS — G40909 Epilepsy, unspecified, not intractable, without status epilepticus: Secondary | ICD-10-CM

## 2018-02-26 LAB — VITAMIN D 25 HYDROXY (VIT D DEFICIENCY, FRACTURES): VIT D 25 HYDROXY: 117 ng/mL — AB (ref 30.0–100.0)

## 2018-02-26 LAB — SPECIMEN STATUS REPORT

## 2018-02-26 NOTE — Telephone Encounter (Signed)
PC placed to pt this a.m to go over lab results.  I left message on voicemail informing him that his Vit D level is elevated.  Advised to stop any Vit D supplement or MV that contains Vit D for now.   Also informed of low Dilantin level. Advised we can change to different sz med but for now, continue Dilantin.  Take 300 mg this morning, then 300 mg in 2 hrs then another 300 mg in 4  Hrs.  Take his 300 mg tonight at 10 p.m.  Starting tomorrow, continue 300 mg twice a day.  Pt advise to call me at the office today if he has questions about these instructions.  Return to lab next wk for Dilantin level.

## 2018-02-26 NOTE — Addendum Note (Signed)
Addended by: Jenel Lucks D on: 02/26/2018 05:04 PM   Modules accepted: Level of Service

## 2018-03-02 ENCOUNTER — Ambulatory Visit (INDEPENDENT_AMBULATORY_CARE_PROVIDER_SITE_OTHER): Payer: BLUE CROSS/BLUE SHIELD | Admitting: Neurology

## 2018-03-02 ENCOUNTER — Encounter: Payer: Self-pay | Admitting: Neurology

## 2018-03-02 VITALS — BP 150/87 | HR 86 | Ht 68.0 in | Wt 261.8 lb

## 2018-03-02 DIAGNOSIS — G40009 Localization-related (focal) (partial) idiopathic epilepsy and epileptic syndromes with seizures of localized onset, not intractable, without status epilepticus: Secondary | ICD-10-CM

## 2018-03-02 DIAGNOSIS — G40909 Epilepsy, unspecified, not intractable, without status epilepticus: Secondary | ICD-10-CM

## 2018-03-02 MED ORDER — PHENYTOIN SODIUM EXTENDED 300 MG PO CAPS: 300.0000 mg | ORAL_CAPSULE | Freq: Every day | ORAL | 3 refills | Status: DC

## 2018-03-02 MED ORDER — LEVETIRACETAM ER 500 MG PO TB24: 500.0000 mg | ORAL_TABLET | Freq: Every day | ORAL | 3 refills | Status: DC

## 2018-03-02 NOTE — Patient Instructions (Addendum)
I had a long discussion with the patient regards to his recent episode of possible complex partial seizure and remote history of generalized and partial seizures. I recommend he reduce the dose of Dilantin from 300 mg twice daily back to 300 milligrams  Once daily as it is too higher dose and may not work as well for complex partial seizures. And Keppra XR 500 mg daily instead. Check EEG and MRI scan of the brain with and without contrast. Patient was counseled not to drive aspirin [address] for 6 months. He was also advised to stay away from climbing heights and dangerous machinery. She was counseled to be regular with his sleep-wake cycle as well. He will return for follow-up in 3 months to see minus practitioner Shanda Bumps call earlier if necessary

## 2018-03-02 NOTE — Progress Notes (Signed)
[facility] [address] [place]. [place] [address] [phone]       OFFICE CONSULT NOTE  Mr. Albert Garcia Date of Birth:  1970-12-30 Medical Record Number:  [id number]   Referring MD: Ladell Pier Reason for Referral:  seizure  HPI: Albert Garcia is a 47 year old male who states he's had history of seizures since teenage years.he describes both staring spells as well as generalized conversions in the past and had tried phenobarbital, Depakote, carbamazepine and Dilantin had been seizure-free for more than 8 years until 2 weeks ago. He states he had an aura and deja vu  like feeling with lightheadedness. He had to sit down. He lost awareness of surroundings and fell asleep. He woke up and out and half later and felt quite tired and weak all over and this feeling lasted for 4-6 hours. The patient was previously taking Dilantin 300 mg daily which he has taken for the last couple of the kids. His Dilantin level was checked and found to be 4.5 and his primary care physician increase the Dilantin dose to 300 mg twice daily. He had a follow-up level on 02/24/18 which was 6.5. Patient denies any signs or Dilantin toxicity in the form of nausea, dizziness, gait ataxia and diplopia. Patient was seen previously on 12/27/15 by Dr. Santiago Glad endocrine no and advised to continue Dilantin 300 mg daily at that visit. The patient was previously found to have low vitamin D levels but he has been on replacement and most recent vitamin D level has been supratherapeutic. The patient has no family history of epilepsy and denies any history of perinatal infection, injury, trauma, febrile seizures. He denies drug abuse or heavy alcohol intake.e states he sleeps well every night and denies irregular sleeping or eating habits. He works with chemicals and operates heavy machinery.Review of electronic medical records shows that he had an MRI scan of the brain done in 2001 which was unremarkable and a CT scan of  the head in 2014 which was also normal. He has had no EEG is documented in the medical records recently.  ROS:   14 system review of systems is positive for  Fatigue, weight gain, blurred vision, snoring, feeling hot, cramps, importance, racing thoughts, change in appetite, not enough sleep, seizure, insomnia, restless legs and all other systems negative  PMH:  Past Medical History:  Diagnosis Date  . Diabetes mellitus   . Erectile dysfunction   . Headache   . Hypertension   . Seizures ([place])     Social History:  Social History   Socioeconomic History  . Marital status: Legally Separated    Spouse name: Not on file  . Number of children: 3  . Years of education: Not on file  . Highest education level: Not on file  Occupational History  . Occupation: Arts development officer: [employer]  . Financial resource strain: Not on file  . Food insecurity:    Worry: Not on file    Inability: Not on file  . Transportation needs:    Medical: Not on file    Non-medical: Not on file  Tobacco Use  . Smoking status: Never Smoker  . Smokeless tobacco: Never Used  Substance and Sexual Activity  . Alcohol use: Yes    Alcohol/week: 0.0 oz    Comment: Occasionally throughout year  . Drug use: No  . Sexual activity: Never  Lifestyle  . Physical activity:    Days per week:  Not on file    Minutes per session: Not on file  . Stress: Not on file  Relationships  . Social connections:    Talks on phone: Not on file    Gets together: Not on file    Attends religious service: Not on file    Active member of club or organization: Not on file    Attends meetings of clubs or organizations: Not on file    Relationship status: Not on file  . Intimate partner violence:    Fear of current or ex partner: Not on file    Emotionally abused: Not on file    Physically abused: Not on file    Forced sexual activity: Not on file  Other Topics Concern  . Not on file  Social  History Narrative  . Not on file    Medications:   Current Outpatient Medications on File Prior to Visit  Medication Sig Dispense Refill  . aspirin 325 MG EC tablet Take 325 mg by mouth daily.    Marland Kitchen atorvastatin (LIPITOR) 80 MG tablet Take 1 tablet (80 mg total) by mouth daily. 30 tablet 2  . Blood Glucose Monitoring Suppl (ONE TOUCH ULTRA 2) w/Device KIT Use 3 times daily 1 each 0  . Blood Glucose Monitoring Suppl (TRUE METRIX METER) w/Device KIT 1 each by Does not apply route as needed. 1 kit 0  . gabapentin (NEURONTIN) 300 MG capsule Take 1 capsule (300 mg total) by mouth at bedtime. 30 capsule 2  . glucose blood (TRUE METRIX BLOOD GLUCOSE TEST) test strip 1 each by Other route 3 (three) times daily. 100 each 11  . glucose blood test strip Use as instructed 3 times daily 100 each 2  . hydrochlorothiazide (HYDRODIURIL) 25 MG tablet Take 1 tablet (25 mg total) by mouth daily. 30 tablet 3  . Insulin Glargine (LANTUS SOLOSTAR) 100 UNIT/ML Solostar Pen Inject 50 Units into the skin at bedtime. 15 mL 5  . insulin lispro (HUMALOG KWIKPEN) 100 UNIT/ML KiwkPen Inject 0.23 mLs (23 Units total) into the skin 3 (three) times daily. Take with meals 15 mL 2  . Insulin Pen Needle 31G X 8 MM MISC Use as directed 100 each 2  . lisinopril (PRINIVIL,ZESTRIL) 10 MG tablet Take 1 tablet (10 mg total) by mouth daily. 30 tablet 3  . Multiple Vitamin (MULTIVITAMIN WITH MINERALS) TABS tablet Take 1 tablet by mouth daily. Reported on 11/13/2015    . naproxen (NAPROSYN) 500 MG tablet     . ONETOUCH DELICA LANCETS 01V MISC 1 each by Does not apply route 3 (three) times daily. 100 each 2  . sildenafil (VIAGRA) 50 MG tablet Take 0.5-1 tablets (25-50 mg total) by mouth daily as needed for erectile dysfunction. 10 tablet 6  . sitaGLIPtin-metformin (JANUMET) 50-1000 MG tablet Take 1 tablet by mouth 2 (two) times daily with a meal. 180 tablet 0  . TRUEPLUS LANCETS 28G MISC 1 each by Does not apply route 3 (three) times  daily. 100 each 11  . acetaminophen (ARTHRITIS PAIN RELIEF) 650 MG CR tablet Take 650 mg by mouth every 8 (eight) hours as needed for pain. Reported on 11/13/2015    . amLODipine (NORVASC) 10 MG tablet Take 1 tablet (10 mg total) by mouth daily. (Patient not taking: Reported on 03/02/2018) 30 tablet 2   No current facility-administered medications on file prior to visit.     Allergies:  No Known Allergies  Physical Exam General: well developed, well nourished middle-age male,  seated, in no evident distress Head: head normocephalic and atraumatic.   Neck: supple with no carotid or supraclavicular bruits Cardiovascular: regular rate and rhythm, no murmurs Musculoskeletal: no deformity Skin:  no rash/petichiae Vascular:  Normal pulses all extremities  Neurologic Exam Mental Status: Awake and fully alert. Oriented to place and time. Recent and remote memory intact. Attention span, concentration and fund of knowledge appropriate. Mood and affect appropriate.  Cranial Nerves: Fundoscopic exam reveals sharp disc margins. Pupils equal, briskly reactive to light. Extraocular movements full without nystagmus. Visual fields full to confrontation. Hearing intact. Facial sensation intact. Face, tongue, palate moves normally and symmetrically.  Motor: Normal bulk and tone. Normal strength in all tested extremity muscles. Sensory.: intact to touch , pinprick , position and vibratory sensation.  Coordination: Rapid alternating movements normal in all extremities. Finger-to-nose and heel-to-shin performed accurately bilaterally. Gait and Station: Arises from chair without difficulty. Stance is normal. Gait demonstrates normal stride length and balance . Able to heel, toe and tandem walk without difficulty.  Reflexes: 1+ and symmetric. Toes downgoing.       ASSESSMENT: 47 year old male with long-standing history of seizures since teenage years who was doing well on Dilantin and had been seizure-free for  more than 7 years with recent episode of aura and possible complex partial seizure 2 weeks ago.    PLAN: I had a long discussion with the patient regards to his recent episode of possible complex partial seizure and remote history of generalized and partial seizures. I recommend he reduce the dose of Dilantin from 300 mg twice daily back to 300 milligrams  Once daily as it is too higher dose and may not work as well for complex partial seizures. And Keppra XR 500 mg daily instead. Check EEG and MRI scan of the brain with and without contrast. Patient was counseled not to drive aspirin [place] law for 6 months. He was also advised to stay away from climbing heights and dangerous machinery. She was counseled to be regular with his sleep-wake cycle as well. Greater than 50% time during this 45 minute consultation visit was spent on counseling and coordination of care about seizures and answered questions.He will return for follow-up in 3 months to see my nurse practitioner Janett Billow or call earlier if necessary Antony Contras, MD  [facility] [address] [place], [place] [address]  Phone [phone] Fax [phone] Note: This document was prepared with digital dictation and possible smart phrase technology. Any transcriptional errors that result from this process are unintentional.

## 2018-03-03 ENCOUNTER — Other Ambulatory Visit: Payer: Self-pay | Admitting: Neurology

## 2018-03-03 ENCOUNTER — Telehealth: Payer: Self-pay | Admitting: Neurology

## 2018-03-03 DIAGNOSIS — T1590XA Foreign body on external eye, part unspecified, unspecified eye, initial encounter: Secondary | ICD-10-CM

## 2018-03-03 NOTE — Telephone Encounter (Signed)
Ok I ordered xray of orbits

## 2018-03-03 NOTE — Telephone Encounter (Signed)
MR Brain w/wo contrast Dr. Gunnar Fusi Auth: NPR Ref # [id number]. Patient request to have his MRI on June 5.. He is scheduled for 03/31/18 for the [facility] mobile unit.  He informed me he has been around metal before and he has had it in his eye before and so I asked him to get a Xray done before having the MRI just to make sure there is nothing in there before he has the MRI. When you get a chance can you please put a Xray order in to have done at [facility]?

## 2018-03-04 NOTE — Telephone Encounter (Signed)
I spoke to the patient and he is aware to get they xray done because his MRI appt on 03/31/18. He is aware he can go to [facility] any time before 3:30 pm to get it done.

## 2018-03-10 ENCOUNTER — Encounter: Payer: Self-pay | Admitting: Internal Medicine

## 2018-03-12 ENCOUNTER — Ambulatory Visit
Admission: RE | Admit: 2018-03-12 | Discharge: 2018-03-12 | Disposition: A | Discharge status: Home / Self Care | Payer: BLUE CROSS/BLUE SHIELD | Source: Ambulatory Visit | Attending: Neurology | Admitting: Neurology

## 2018-03-12 DIAGNOSIS — T1590XA Foreign body on external eye, part unspecified, unspecified eye, initial encounter: Secondary | ICD-10-CM

## 2018-03-24 ENCOUNTER — Other Ambulatory Visit: Payer: Self-pay | Admitting: Internal Medicine

## 2018-03-24 DIAGNOSIS — I10 Essential (primary) hypertension: Secondary | ICD-10-CM

## 2018-03-24 DIAGNOSIS — E1165 Type 2 diabetes mellitus with hyperglycemia: Secondary | ICD-10-CM

## 2018-03-24 DIAGNOSIS — IMO0002 Reserved for concepts with insufficient information to code with codable children: Secondary | ICD-10-CM

## 2018-03-24 DIAGNOSIS — E1140 Type 2 diabetes mellitus with diabetic neuropathy, unspecified: Secondary | ICD-10-CM

## 2018-03-24 MED ORDER — AMLODIPINE BESYLATE 10 MG PO TABS: 10.0000 mg | ORAL_TABLET | Freq: Every day | ORAL | 11 refills | Status: DC

## 2018-03-24 MED ORDER — GABAPENTIN 300 MG PO CAPS: 300.0000 mg | ORAL_CAPSULE | Freq: Every day | ORAL | 11 refills | Status: DC

## 2018-03-25 ENCOUNTER — Encounter: Payer: Self-pay | Admitting: Internal Medicine

## 2018-03-25 ENCOUNTER — Ambulatory Visit: Payer: BLUE CROSS/BLUE SHIELD | Attending: Internal Medicine | Admitting: Internal Medicine

## 2018-03-25 VITALS — BP 109/71 | HR 99 | Temp 97.4°F | Resp 16 | Wt 255.0 lb

## 2018-03-25 DIAGNOSIS — R809 Proteinuria, unspecified: Secondary | ICD-10-CM | POA: Diagnosis not present

## 2018-03-25 DIAGNOSIS — E559 Vitamin D deficiency, unspecified: Secondary | ICD-10-CM | POA: Diagnosis not present

## 2018-03-25 DIAGNOSIS — Z833 Family history of diabetes mellitus: Secondary | ICD-10-CM | POA: Diagnosis not present

## 2018-03-25 DIAGNOSIS — Z79899 Other long term (current) drug therapy: Secondary | ICD-10-CM | POA: Insufficient documentation

## 2018-03-25 DIAGNOSIS — F329 Major depressive disorder, single episode, unspecified: Secondary | ICD-10-CM | POA: Diagnosis not present

## 2018-03-25 DIAGNOSIS — R112 Nausea with vomiting, unspecified: Secondary | ICD-10-CM

## 2018-03-25 DIAGNOSIS — E1121 Type 2 diabetes mellitus with diabetic nephropathy: Secondary | ICD-10-CM | POA: Insufficient documentation

## 2018-03-25 DIAGNOSIS — F419 Anxiety disorder, unspecified: Secondary | ICD-10-CM | POA: Diagnosis not present

## 2018-03-25 DIAGNOSIS — Z8269 Family history of other diseases of the musculoskeletal system and connective tissue: Secondary | ICD-10-CM | POA: Insufficient documentation

## 2018-03-25 DIAGNOSIS — Z8249 Family history of ischemic heart disease and other diseases of the circulatory system: Secondary | ICD-10-CM | POA: Diagnosis not present

## 2018-03-25 DIAGNOSIS — F418 Other specified anxiety disorders: Secondary | ICD-10-CM | POA: Insufficient documentation

## 2018-03-25 DIAGNOSIS — Z6838 Body mass index (BMI) 38.0-38.9, adult: Secondary | ICD-10-CM | POA: Diagnosis not present

## 2018-03-25 DIAGNOSIS — E118 Type 2 diabetes mellitus with unspecified complications: Secondary | ICD-10-CM

## 2018-03-25 DIAGNOSIS — I10 Essential (primary) hypertension: Secondary | ICD-10-CM | POA: Diagnosis not present

## 2018-03-25 DIAGNOSIS — E785 Hyperlipidemia, unspecified: Secondary | ICD-10-CM | POA: Diagnosis not present

## 2018-03-25 DIAGNOSIS — Z794 Long term (current) use of insulin: Secondary | ICD-10-CM | POA: Diagnosis not present

## 2018-03-25 DIAGNOSIS — G40409 Other generalized epilepsy and epileptic syndromes, not intractable, without status epilepticus: Secondary | ICD-10-CM | POA: Diagnosis not present

## 2018-03-25 DIAGNOSIS — E1129 Type 2 diabetes mellitus with other diabetic kidney complication: Secondary | ICD-10-CM

## 2018-03-25 DIAGNOSIS — R634 Abnormal weight loss: Secondary | ICD-10-CM

## 2018-03-25 DIAGNOSIS — E669 Obesity, unspecified: Secondary | ICD-10-CM | POA: Diagnosis not present

## 2018-03-25 DIAGNOSIS — Z841 Family history of disorders of kidney and ureter: Secondary | ICD-10-CM | POA: Insufficient documentation

## 2018-03-25 DIAGNOSIS — G40909 Epilepsy, unspecified, not intractable, without status epilepticus: Secondary | ICD-10-CM

## 2018-03-25 DIAGNOSIS — N529 Male erectile dysfunction, unspecified: Secondary | ICD-10-CM | POA: Diagnosis not present

## 2018-03-25 DIAGNOSIS — F32A Depression, unspecified: Secondary | ICD-10-CM

## 2018-03-25 DIAGNOSIS — Z7982 Long term (current) use of aspirin: Secondary | ICD-10-CM | POA: Insufficient documentation

## 2018-03-25 DIAGNOSIS — IMO0002 Reserved for concepts with insufficient information to code with codable children: Secondary | ICD-10-CM

## 2018-03-25 DIAGNOSIS — E119 Type 2 diabetes mellitus without complications: Secondary | ICD-10-CM | POA: Diagnosis present

## 2018-03-25 DIAGNOSIS — E1165 Type 2 diabetes mellitus with hyperglycemia: Secondary | ICD-10-CM | POA: Diagnosis not present

## 2018-03-25 LAB — GLUCOSE, POCT (MANUAL RESULT ENTRY): POC GLUCOSE: 166 mg/dL — AB (ref 70–99)

## 2018-03-25 MED ORDER — PROMETHAZINE HCL 12.5 MG PO TABS: 12.5000 mg | ORAL_TABLET | Freq: Every day | ORAL | 0 refills | Status: DC | PRN

## 2018-03-25 MED ORDER — INSULIN LISPRO 100 UNIT/ML (KWIKPEN): 23.0000 [IU] | PEN_INJECTOR | Freq: Three times a day (TID) | SUBCUTANEOUS | 6 refills | Status: DC

## 2018-03-25 NOTE — Patient Instructions (Addendum)
Please check blood sugars twice a day and bring in readings with you.    Try eating smaller, more frequent meals. Use Phenergan as needed for nausea.  Prescription sent to your pharmacy.

## 2018-03-25 NOTE — Progress Notes (Signed)
Patient ID: Albert Garcia, male    DOB: 04-18-1971  MRN: [id number]  CC: Diabetes and Emesis   Subjective: Albert Garcia is a 47 y.o. male who presents for follow-up of chronic disease management   His concerns today include:  Pt with hx of DM, HTN, HL, Sz, dep/anx, ED  Sz:  Saw Dr. Leonie Man. Dilantin change to ER 300 mg once a day and Keppra added -no Sz since last visit MRI and EEG ordered -Patient states that he was advised to avoid machine operating or driving until he seizure-free for 6 mths.  He is still off on disability from work  DM:  Checking BS once a day to QOD. Reports BS have been below 200.  Reports compliance with Lantus to 50 units and Humalog.  Sometimes he takes less than 23 uints of Humalog; only gets 3 pens a mth.  -macroalbuminuria on recent labs Has been having some nause/vomiting more so with heavy meals.  Occurs about 4-5 x a wk.  No abdominal pains.  Some diarrhea last wk.  -walking 15 mins 3-4 times a wk now  LE edema resolved with HCTZ.    Mood:  " Better when I sleep."  He did meet with our LCSW since last visit with me  Patient Active Problem List   Diagnosis Date Noted  . Type 2 diabetes mellitus with microalbuminuria, with long-term current use of insulin ([place]) 12/14/2017  . Depression with anxiety 12/14/2017  . Vitamin D insufficiency 11/30/2016  . Epilepsy, generalized, convulsive ([place]) 12/27/2015  . Long-term use of high-risk medication 12/27/2015  . Microalbuminuric diabetic nephropathy ([place]) 11/15/2015  . Hyperlipidemia associated with type 2 diabetes mellitus ([place]) 11/15/2015  . Erectile dysfunction 11/13/2015  . Muscle cramps 11/13/2015  . Type 2 diabetes mellitus, uncontrolled ([place]) 09/06/2007  . OBESITY 09/06/2007  . HTN (hypertension) 09/06/2007  . Seizure disorder ([place]) 09/06/2007     Current Outpatient Medications on File Prior to Visit  Medication Sig Dispense Refill  . acetaminophen (ARTHRITIS PAIN RELIEF) 650 MG CR tablet Take  650 mg by mouth every 8 (eight) hours as needed for pain. Reported on 11/13/2015    . amLODipine (NORVASC) 10 MG tablet Take 1 tablet (10 mg total) by mouth daily. 30 tablet 11  . aspirin 325 MG EC tablet Take 325 mg by mouth daily.    Marland Kitchen atorvastatin (LIPITOR) 80 MG tablet Take 1 tablet (80 mg total) by mouth daily. 30 tablet 2  . Blood Glucose Monitoring Suppl (ONE TOUCH ULTRA 2) w/Device KIT Use 3 times daily 1 each 0  . Blood Glucose Monitoring Suppl (TRUE METRIX METER) w/Device KIT 1 each by Does not apply route as needed. 1 kit 0  . gabapentin (NEURONTIN) 300 MG capsule Take 1 capsule (300 mg total) by mouth at bedtime. 30 capsule 11  . glucose blood (TRUE METRIX BLOOD GLUCOSE TEST) test strip 1 each by Other route 3 (three) times daily. 100 each 11  . glucose blood test strip Use as instructed 3 times daily 100 each 2  . hydrochlorothiazide (HYDRODIURIL) 25 MG tablet Take 1 tablet (25 mg total) by mouth daily. 30 tablet 3  . Insulin Glargine (LANTUS SOLOSTAR) 100 UNIT/ML Solostar Pen Inject 50 Units into the skin at bedtime. 15 mL 5  . insulin lispro (HUMALOG KWIKPEN) 100 UNIT/ML KiwkPen Inject 0.23 mLs (23 Units total) into the skin 3 (three) times daily. Take with meals 15 mL 2  . Insulin Pen Needle 31G X 8  MM MISC Use as directed 100 each 2  . levETIRAcetam (KEPPRA XR) 500 MG 24 hr tablet Take 1 tablet (500 mg total) by mouth daily. 90 tablet 3  . lisinopril (PRINIVIL,ZESTRIL) 10 MG tablet Take 1 tablet (10 mg total) by mouth daily. 30 tablet 3  . Multiple Vitamin (MULTIVITAMIN WITH MINERALS) TABS tablet Take 1 tablet by mouth daily. Reported on 11/13/2015    . naproxen (NAPROSYN) 500 MG tablet     . ONETOUCH DELICA LANCETS 74M MISC 1 each by Does not apply route 3 (three) times daily. 100 each 2  . phenytoin (DILANTIN) 300 MG ER capsule Take 1 capsule (300 mg total) by mouth daily. 60 capsule 3  . sildenafil (VIAGRA) 50 MG tablet Take 0.5-1 tablets (25-50 mg total) by mouth daily as needed  for erectile dysfunction. 10 tablet 6  . sitaGLIPtin-metformin (JANUMET) 50-1000 MG tablet Take 1 tablet by mouth 2 (two) times daily with a meal. 180 tablet 0  . TRUEPLUS LANCETS 28G MISC 1 each by Does not apply route 3 (three) times daily. 100 each 11   No current facility-administered medications on file prior to visit.     No Known Allergies  Social History   Socioeconomic History  . Marital status: Legally Separated    Spouse name: Not on file  . Number of children: 3  . Years of education: Not on file  . Highest education level: Not on file  Occupational History  . Occupation: Arts development officer: [employer]  . Financial resource strain: Not on file  . Food insecurity:    Worry: Not on file    Inability: Not on file  . Transportation needs:    Medical: Not on file    Non-medical: Not on file  Tobacco Use  . Smoking status: Never Smoker  . Smokeless tobacco: Never Used  Substance and Sexual Activity  . Alcohol use: Yes    Alcohol/week: 0.0 oz    Comment: Occasionally throughout year  . Drug use: No  . Sexual activity: Never  Lifestyle  . Physical activity:    Days per week: Not on file    Minutes per session: Not on file  . Stress: Not on file  Relationships  . Social connections:    Talks on phone: Not on file    Gets together: Not on file    Attends religious service: Not on file    Active member of club or organization: Not on file    Attends meetings of clubs or organizations: Not on file    Relationship status: Not on file  . Intimate partner violence:    Fear of current or ex partner: Not on file    Emotionally abused: Not on file    Physically abused: Not on file    Forced sexual activity: Not on file  Other Topics Concern  . Not on file  Social History Narrative  . Not on file    Family History  Problem Relation Age of Onset  . Hypertension Mother   . Diabetes Mother   . Lupus Mother   . Kidney failure Mother     . Diabetes Father   . Hypertension Father   . Kidney failure Father     Past Surgical History:  Procedure Laterality Date  . NO PAST SURGERIES      ROS: Review of Systems Negative except as stated above PHYSICAL EXAM: BP 109/71   Pulse 99   Temp Marland Kitchen)  97.4 F (36.3 C) (Oral)   Resp 16   Wt 255 lb (115.7 kg)   SpO2 96%   BMI 38.77 kg/m   Wt Readings from Last 3 Encounters:  03/25/18 255 lb (115.7 kg)  03/02/18 261 lb 12.8 oz (118.8 kg)  02/18/18 270 lb (122.5 kg)    Physical Exam General appearance - alert, well appearing, and in no distress Mental status - normal mood, behavior, speech, dress, motor activity, and thought processes Neck - supple, no significant adenopathy Chest - clear to auscultation, no wheezes, rales or rhonchi, symmetric air entry Heart - normal rate, regular rhythm, normal S1, S2, no murmurs, rubs, clicks or gallops Abdomen - soft, nontender, nondistended, no masses or organomegaly Extremities - peripheral pulses normal, no pedal edema, no clubbing or cyanosis  Results for orders placed or performed in visit on 03/25/18  POCT glucose (manual entry)  Result Value Ref Range   POC Glucose 166 (A) 70 - 99 mg/dl   Lab Results  Component Value Date   HGBA1C 9.0 02/18/2018    ASSESSMENT AND PLAN: 1. Type 2 diabetes mellitus with microalbuminuria, with long-term current use of insulin (H[facility]) -Ordered blood sugars improved.  However he needs to check blood sugars more often.  I recommend checking blood sugars twice a day before meals and to bring in readings on next visit -We will see if his insurance allows for a full box of Humalog pens which comes as 5 in the box. Lisinopril to help decrease proteinuria.  We will need to recheck urine in several months - POCT glucose (manual entry)  2. Non-intractable vomiting with nausea, unspecified vomiting type - NM GASTRIC EMPTYING SOLID LIQUID BOTH W/SB TRANSIT; Future - promethazine (PHENERGAN) 12.5 MG  tablet; Take 1 tablet (12.5 mg total) by mouth daily as needed for nausea or vomiting.  Dispense: 30 tablet; Refill: 0  3. Abnormal weight loss Patient has lost 15 pounds since last visit with me.  He attributes this to walking more and change in eating habits.  He has cut back on eating large portion sizes partially due to the nausea and sometimes vomiting that he has been having recently  4. Seizure disorder (HCC) Continue Dilantin and Keppra  5. Anxiety and depression Continue to monitor.  Patient does not want medication at this time   Patient was given the opportunity to ask questions.  Patient verbalized understanding of the plan and was able to repeat key elements of the plan.   Orders Placed This Encounter  Procedures  . NM GASTRIC EMPTYING SOLID LIQUID BOTH W/SB TRANSIT  . POCT glucose (manual entry)     Requested Prescriptions   Signed Prescriptions Disp Refills  . promethazine (PHENERGAN) 12.5 MG tablet 30 tablet 0    Sig: Take 1 tablet (12.5 mg total) by mouth daily as needed for nausea or vomiting.    Return in about 6 weeks (around 05/06/2018).  Karle Plumber, MD, FACP

## 2018-03-26 ENCOUNTER — Telehealth: Payer: Self-pay | Admitting: Radiology

## 2018-03-26 NOTE — Telephone Encounter (Signed)
Pt is wanting to know if there is anything earlier in the morning. Please call to advise

## 2018-03-29 ENCOUNTER — Encounter: Payer: Self-pay | Admitting: Internal Medicine

## 2018-03-29 NOTE — Telephone Encounter (Signed)
I spoke to the patient and informed him he would have to arrive at 10 AM for his 0:30 for the MRI. He stated he will keep that time.

## 2018-03-31 ENCOUNTER — Ambulatory Visit: Payer: BLUE CROSS/BLUE SHIELD

## 2018-03-31 DIAGNOSIS — G40009 Localization-related (focal) (partial) idiopathic epilepsy and epileptic syndromes with seizures of localized onset, not intractable, without status epilepticus: Secondary | ICD-10-CM

## 2018-03-31 LAB — HM DIABETES EYE EXAM

## 2018-03-31 MED ORDER — GADOPENTETATE DIMEGLUMINE 469.01 MG/ML IV SOLN
20.0000 mL | Freq: Once | INTRAVENOUS | Status: AC | PRN
  Administered 2018-03-31: 20 mL via INTRAVENOUS

## 2018-04-01 ENCOUNTER — Other Ambulatory Visit: Payer: BLUE CROSS/BLUE SHIELD

## 2018-04-07 ENCOUNTER — Telehealth: Payer: Self-pay | Admitting: *Deleted

## 2018-04-07 ENCOUNTER — Other Ambulatory Visit: Payer: Self-pay

## 2018-04-07 DIAGNOSIS — R112 Nausea with vomiting, unspecified: Secondary | ICD-10-CM

## 2018-04-07 NOTE — Telephone Encounter (Signed)
I spoke with pt he is going to call his insurance  company to have them to fax over his form to [facility]

## 2018-04-08 ENCOUNTER — Other Ambulatory Visit: Payer: Self-pay | Admitting: Internal Medicine

## 2018-04-08 ENCOUNTER — Telehealth: Payer: Self-pay | Admitting: Internal Medicine

## 2018-04-08 DIAGNOSIS — E1165 Type 2 diabetes mellitus with hyperglycemia: Principal | ICD-10-CM

## 2018-04-08 DIAGNOSIS — IMO0002 Reserved for concepts with insufficient information to code with codable children: Secondary | ICD-10-CM

## 2018-04-08 DIAGNOSIS — E1140 Type 2 diabetes mellitus with diabetic neuropathy, unspecified: Secondary | ICD-10-CM

## 2018-04-08 DIAGNOSIS — E785 Hyperlipidemia, unspecified: Secondary | ICD-10-CM

## 2018-04-08 DIAGNOSIS — I10 Essential (primary) hypertension: Secondary | ICD-10-CM

## 2018-04-08 DIAGNOSIS — E1169 Type 2 diabetes mellitus with other specified complication: Secondary | ICD-10-CM

## 2018-04-08 MED ORDER — SITAGLIPTIN PHOS-METFORMIN HCL 50-1000 MG PO TABS: 1.0000 | ORAL_TABLET | Freq: Two times a day (BID) | ORAL | 2 refills | Status: DC

## 2018-04-08 MED ORDER — HYDROCHLOROTHIAZIDE 25 MG PO TABS: 25.0000 mg | ORAL_TABLET | Freq: Every day | ORAL | 3 refills | Status: DC

## 2018-04-08 MED ORDER — LISINOPRIL 10 MG PO TABS: 10.0000 mg | ORAL_TABLET | Freq: Every day | ORAL | 3 refills | Status: DC

## 2018-04-08 MED ORDER — ATORVASTATIN CALCIUM 80 MG PO TABS: 80.0000 mg | ORAL_TABLET | Freq: Every day | ORAL | 2 refills | Status: DC

## 2018-04-08 MED ORDER — AMLODIPINE BESYLATE 10 MG PO TABS: 10.0000 mg | ORAL_TABLET | Freq: Every day | ORAL | 3 refills | Status: DC

## 2018-04-08 NOTE — Telephone Encounter (Signed)
-----   Message from Particia LatherJay'A R Pollock, [place]MA sent at 04/07/2018  5:04 PM EDT ----- Regarding: Rx Refill  Pt is requesting all medication refill for all his medicine. Pt state she has been waiting since last office visit

## 2018-04-09 ENCOUNTER — Encounter (HOSPITAL_COMMUNITY): Payer: BLUE CROSS/BLUE SHIELD

## 2018-04-12 ENCOUNTER — Telehealth: Payer: Self-pay | Admitting: Internal Medicine

## 2018-04-12 NOTE — Telephone Encounter (Signed)
Called patient and scheduled an appt. For 7/2 with PCP.

## 2018-04-13 ENCOUNTER — Telehealth: Payer: Self-pay | Admitting: Internal Medicine

## 2018-04-13 NOTE — Telephone Encounter (Signed)
-----   Message from Hildred AlaminKatrina Y Murrell, RN sent at 04/12/2018  8:15 AM EDT -----   ----- Message ----- From: Micki RileySethi, Pramod S, MD Sent: 04/09/2018  12:42 PM To: Hildred AlaminKatrina Y Murrell, RN  Dr Laural BenesJohnson,  he had been seizure free for many years and recent seizure may have been due to rapid dilantin taper. I have put him on keppra and if he is tolerating it he should be fine and may return to work. He has driving limitations for 6 months as per [place] law.Brain MRI was fairly benign but EEG is yet pending ----- Message ----- From: Hildred AlaminMurrell, Katrina Y, RN Sent: 04/08/2018   8:56 AM To: Micki RileyPramod S Sethi, MD    ----- Message ----- From: Marcine MatarJohnson, Cosme Jacob B, MD Sent: 04/08/2018   8:49 AM To: Micki RileyPramod S Sethi, MD, Hildred AlaminKatrina Y Murrell, RN  I received the form and has started filling it out.  I was just asking your opinion as to when we can possibly send him back to work given the type of work that he does. ----- Message ----- From: Micki RileySethi, Pramod S, MD Sent: 04/07/2018   3:51 PM To: Hildred AlaminKatrina Y Murrell, RN, Marcine Matareborah B Cloma Rahrig, MD  I will have my office look into this to see if we received this form ----- Message ----- From: Marcine MatarJohnson, Daelan Gatt B, MD Sent: 04/07/2018   1:14 PM To: Micki RileyPramod S Sethi, MD  Dr. Pearlean BrownieSethi:  I am the PCP for this pt. 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  [place] out [place] he described what sound like possible seizure.  You saw him last mth. How long due to think I need to keep him out of work or should he not return to this type of work where he is working with any type of machinery?

## 2018-04-20 MED FILL — HYDROCHLOROTHIAZIDE 25 MG T: 25 | 30 days supply | Qty: 30 | Fill #1

## 2018-04-20 MED FILL — LISINOPRIL 10 MG TABS: 10 | 30 days supply | Qty: 30 | Fill #1

## 2018-04-21 ENCOUNTER — Encounter (HOSPITAL_COMMUNITY)
Admission: RE | Admit: 2018-04-21 | Discharge: 2018-04-21 | Disposition: A | Discharge status: Home / Self Care | Payer: BLUE CROSS/BLUE SHIELD | Source: Ambulatory Visit | Attending: Internal Medicine | Admitting: Internal Medicine

## 2018-04-21 ENCOUNTER — Ambulatory Visit (INDEPENDENT_AMBULATORY_CARE_PROVIDER_SITE_OTHER): Payer: BLUE CROSS/BLUE SHIELD

## 2018-04-21 DIAGNOSIS — R112 Nausea with vomiting, unspecified: Secondary | ICD-10-CM | POA: Diagnosis not present

## 2018-04-21 DIAGNOSIS — G40009 Localization-related (focal) (partial) idiopathic epilepsy and epileptic syndromes with seizures of localized onset, not intractable, without status epilepticus: Secondary | ICD-10-CM | POA: Diagnosis not present

## 2018-04-21 MED ORDER — TECHNETIUM TC 99M SULFUR COLLOID
2.0000 | Freq: Once | INTRAVENOUS | Status: AC | PRN
  Administered 2018-04-21: 2 via ORAL

## 2018-04-21 MED FILL — AMLODIPINE BESYLATE 10 MG T: 10 | 30 days supply | Qty: 30 | Fill #0

## 2018-04-21 MED FILL — GABAPENTIN 300 MG CAPSULE: 300 | 30 days supply | Qty: 30 | Fill #0

## 2018-04-21 MED FILL — ATORVASTATIN 80 MG TABLET: 80 | 30 days supply | Qty: 30 | Fill #0

## 2018-04-21 MED FILL — HUMALOG 100 UNITS/ML KWIKPE: 100 | 9 days supply | Qty: 6 | Fill #0

## 2018-04-22 ENCOUNTER — Telehealth: Payer: Self-pay

## 2018-04-22 LAB — HM DIABETES EYE EXAM

## 2018-04-22 NOTE — Telephone Encounter (Signed)
Contacted pt to go over nm gastric results pt is aware and doesn't have any questions or concerns

## 2018-04-26 HISTORY — PX: REFRACTIVE SURGERY: SHX103

## 2018-04-27 ENCOUNTER — Ambulatory Visit: Payer: BLUE CROSS/BLUE SHIELD | Attending: Internal Medicine | Admitting: Internal Medicine

## 2018-04-27 ENCOUNTER — Ambulatory Visit: Payer: BLUE CROSS/BLUE SHIELD | Admitting: Licensed Clinical Social Worker

## 2018-04-27 ENCOUNTER — Encounter: Payer: Self-pay | Admitting: Internal Medicine

## 2018-04-27 VITALS — BP 128/87 | HR 94 | Temp 98.2°F | Resp 16 | Wt 259.4 lb

## 2018-04-27 DIAGNOSIS — Z79899 Other long term (current) drug therapy: Secondary | ICD-10-CM | POA: Insufficient documentation

## 2018-04-27 DIAGNOSIS — G40409 Other generalized epilepsy and epileptic syndromes, not intractable, without status epilepticus: Secondary | ICD-10-CM | POA: Insufficient documentation

## 2018-04-27 DIAGNOSIS — E1165 Type 2 diabetes mellitus with hyperglycemia: Secondary | ICD-10-CM | POA: Diagnosis present

## 2018-04-27 DIAGNOSIS — Z7982 Long term (current) use of aspirin: Secondary | ICD-10-CM | POA: Insufficient documentation

## 2018-04-27 DIAGNOSIS — E113592 Type 2 diabetes mellitus with proliferative diabetic retinopathy without macular edema, left eye: Secondary | ICD-10-CM | POA: Insufficient documentation

## 2018-04-27 DIAGNOSIS — E118 Type 2 diabetes mellitus with unspecified complications: Secondary | ICD-10-CM | POA: Diagnosis not present

## 2018-04-27 DIAGNOSIS — IMO0002 Reserved for concepts with insufficient information to code with codable children: Secondary | ICD-10-CM

## 2018-04-27 DIAGNOSIS — E669 Obesity, unspecified: Secondary | ICD-10-CM | POA: Insufficient documentation

## 2018-04-27 DIAGNOSIS — E1129 Type 2 diabetes mellitus with other diabetic kidney complication: Secondary | ICD-10-CM | POA: Diagnosis not present

## 2018-04-27 DIAGNOSIS — E11311 Type 2 diabetes mellitus with unspecified diabetic retinopathy with macular edema: Secondary | ICD-10-CM | POA: Diagnosis not present

## 2018-04-27 DIAGNOSIS — E11319 Type 2 diabetes mellitus with unspecified diabetic retinopathy without macular edema: Secondary | ICD-10-CM

## 2018-04-27 DIAGNOSIS — E1142 Type 2 diabetes mellitus with diabetic polyneuropathy: Secondary | ICD-10-CM | POA: Insufficient documentation

## 2018-04-27 DIAGNOSIS — R809 Proteinuria, unspecified: Secondary | ICD-10-CM

## 2018-04-27 DIAGNOSIS — Z6839 Body mass index (BMI) 39.0-39.9, adult: Secondary | ICD-10-CM | POA: Diagnosis not present

## 2018-04-27 DIAGNOSIS — N529 Male erectile dysfunction, unspecified: Secondary | ICD-10-CM | POA: Diagnosis not present

## 2018-04-27 DIAGNOSIS — Z794 Long term (current) use of insulin: Secondary | ICD-10-CM | POA: Insufficient documentation

## 2018-04-27 DIAGNOSIS — F418 Other specified anxiety disorders: Secondary | ICD-10-CM | POA: Insufficient documentation

## 2018-04-27 DIAGNOSIS — G40909 Epilepsy, unspecified, not intractable, without status epilepticus: Secondary | ICD-10-CM

## 2018-04-27 DIAGNOSIS — I10 Essential (primary) hypertension: Secondary | ICD-10-CM | POA: Insufficient documentation

## 2018-04-27 DIAGNOSIS — E559 Vitamin D deficiency, unspecified: Secondary | ICD-10-CM | POA: Diagnosis not present

## 2018-04-27 DIAGNOSIS — E785 Hyperlipidemia, unspecified: Secondary | ICD-10-CM | POA: Diagnosis not present

## 2018-04-27 DIAGNOSIS — F419 Anxiety disorder, unspecified: Secondary | ICD-10-CM

## 2018-04-27 LAB — GLUCOSE, POCT (MANUAL RESULT ENTRY): POC GLUCOSE: 216 mg/dL — AB (ref 70–99)

## 2018-04-27 NOTE — Progress Notes (Signed)
  Patient ID: Albert Garcia, male    DOB: 04/13/1971  MRN: [id number]  CC: Follow-up   Subjective: Albert Garcia is a 47 y.o. male who presents for chronic ds management/follow up His concerns today include:  Pt with hx of DM, HTN, HL, Sz, dep/anx, ED  SZ: had MRI and EEG.  Reports being told no significant abnormalities.   Doing well on Dilantin and Keppra.  He has not had any further seizures   DM: checking BS 2 x a day.  Before BF 150-200 and bedtime 150-200.  Takes Lantus 45 units (suppose to be on 50 units) and Humalog 25 units with meals.  Admits that he misses doses of Humalog in an effort to ration the amount that he has.  States that he only gets about 3 pens per month. Wants Free style Libre meter Has laser eye surgery LT eye today then RT eye in 2 wks for DM retinopathy.  Sees Dr. Grout   Patient Active Problem List   Diagnosis Date Noted  . Type 2 diabetes mellitus with microalbuminuria, with long-term current use of insulin (HCC) 12/14/2017  . Depression with anxiety 12/14/2017  . Vitamin D insufficiency 11/30/2016  . Epilepsy, generalized, convulsive (HCC) 12/27/2015  . Long-term use of high-risk medication 12/27/2015  . Microalbuminuric diabetic nephropathy (HCC) 11/15/2015  . Hyperlipidemia associated with type 2 diabetes mellitus (HCC) 11/15/2015  . Erectile dysfunction 11/13/2015  . Muscle cramps 11/13/2015  . Type 2 diabetes mellitus, uncontrolled (HCC) 09/06/2007  . OBESITY 09/06/2007  . HTN (hypertension) 09/06/2007  . Seizure disorder (HCC) 09/06/2007     Current Outpatient Medications on File Prior to Visit  Medication Sig Dispense Refill  . acetaminophen (ARTHRITIS PAIN RELIEF) 650 MG CR tablet Take 650 mg by mouth every 8 (eight) hours as needed for pain. Reported on 11/13/2015    . amLODipine (NORVASC) 10 MG tablet Take 1 tablet (10 mg total) by mouth daily. 90 tablet 3  . aspirin 325 MG EC tablet Take 325 mg by mouth daily.    . atorvastatin (LIPITOR)  80 MG tablet Take 1 tablet (80 mg total) by mouth daily. 90 tablet 2  . Blood Glucose Monitoring Suppl (ONE TOUCH ULTRA 2) w/Device KIT Use 3 times daily 1 each 0  . Blood Glucose Monitoring Suppl (TRUE METRIX METER) w/Device KIT 1 each by Does not apply route as needed. 1 kit 0  . gabapentin (NEURONTIN) 300 MG capsule Take 1 capsule (300 mg total) by mouth at bedtime. 30 capsule 11  . glucose blood (TRUE METRIX BLOOD GLUCOSE TEST) test strip 1 each by Other route 3 (three) times daily. 100 each 11  . glucose blood test strip Use as instructed 3 times daily 100 each 2  . hydrochlorothiazide (HYDRODIURIL) 25 MG tablet Take 1 tablet (25 mg total) by mouth daily. 90 tablet 3  . Insulin Glargine (LANTUS SOLOSTAR) 100 UNIT/ML Solostar Pen Inject 50 Units into the skin at bedtime. 15 mL 5  . insulin lispro (HUMALOG KWIKPEN) 100 UNIT/ML KiwkPen Inject 0.23 mLs (23 Units total) into the skin 3 (three) times daily. Take with meals 15 mL 6  . Insulin Pen Needle 31G X 8 MM MISC Use as directed 100 each 2  . levETIRAcetam (KEPPRA XR) 500 MG 24 hr tablet Take 1 tablet (500 mg total) by mouth daily. 90 tablet 3  . lisinopril (PRINIVIL,ZESTRIL) 10 MG tablet Take 1 tablet (10 mg total) by mouth daily. 90 tablet 3  . Multiple   Vitamin (MULTIVITAMIN WITH MINERALS) TABS tablet Take 1 tablet by mouth daily. Reported on 11/13/2015    . naproxen (NAPROSYN) 500 MG tablet     . ONETOUCH DELICA LANCETS 33A MISC 1 each by Does not apply route 3 (three) times daily. 100 each 2  . phenytoin (DILANTIN) 300 MG ER capsule Take 1 capsule (300 mg total) by mouth daily. 60 capsule 3  . promethazine (PHENERGAN) 12.5 MG tablet Take 1 tablet (12.5 mg total) by mouth daily as needed for nausea or vomiting. 30 tablet 0  . sildenafil (VIAGRA) 50 MG tablet Take 0.5-1 tablets (25-50 mg total) by mouth daily as needed for erectile dysfunction. 10 tablet 6  . sitaGLIPtin-metformin (JANUMET) 50-1000 MG tablet Take 1 tablet by mouth 2 (two)  times daily with a meal. 180 tablet 2  . TRUEPLUS LANCETS 28G MISC 1 each by Does not apply route 3 (three) times daily. 100 each 11   No current facility-administered medications on file prior to visit.     No Known Allergies  Social History   Socioeconomic History  . Marital status: Legally Separated    Spouse name: Not on file  . Number of children: 3  . Years of education: Not on file  . Highest education level: Not on file  Occupational History  . Occupation: Arts development officer: [employer]  . Financial resource strain: Not on file  . Food insecurity:    Worry: Not on file    Inability: Not on file  . Transportation needs:    Medical: Not on file    Non-medical: Not on file  Tobacco Use  . Smoking status: Never Smoker  . Smokeless tobacco: Never Used  Substance and Sexual Activity  . Alcohol use: Yes    Alcohol/week: 0.0 oz    Comment: Occasionally throughout year  . Drug use: No  . Sexual activity: Never  Lifestyle  . Physical activity:    Days per week: Not on file    Minutes per session: Not on file  . Stress: Not on file  Relationships  . Social connections:    Talks on phone: Not on file    Gets together: Not on file    Attends religious service: Not on file    Active member of club or organization: Not on file    Attends meetings of clubs or organizations: Not on file    Relationship status: Not on file  . Intimate partner violence:    Fear of current or ex partner: Not on file    Emotionally abused: Not on file    Physically abused: Not on file    Forced sexual activity: Not on file  Other Topics Concern  . Not on file  Social History Narrative  . Not on file    Family History  Problem Relation Age of Onset  . Hypertension Mother   . Diabetes Mother   . Lupus Mother   . Kidney failure Mother   . Diabetes Father   . Hypertension Father   . Kidney failure Father     Past Surgical History:  Procedure Laterality  Date  . NO PAST SURGERIES      ROS: Review of Systems Negative except as stated above PHYSICAL EXAM: BP 128/87   Pulse 94   Temp 98.2 F (36.8 C) (Oral)   Resp 16   Wt 259 lb 6.4 oz (117.7 kg)   SpO2 97%   BMI 39.44 kg/m  Wt Readings from Last 3 Encounters:  04/27/18 259 lb 6.4 oz (117.7 kg)  03/25/18 255 lb (115.7 kg)  03/02/18 261 lb 12.8 oz (118.8 kg)    Physical Exam General appearance - alert, well appearing, and in no distress Mental status - alert, oriented to person, place, and time, normal mood, behavior, speech, dress, motor activity, and thought processes Chest - clear to auscultation, no wheezes, rales or rhonchi, symmetric air entry Heart - normal rate, regular rhythm, normal S1, S2, no murmurs, rubs, clicks or gallops Extremities - peripheral pulses normal, no pedal edema, no clubbing or cyanosis  Results for orders placed or performed in visit on 04/27/18  POCT glucose (manual entry)  Result Value Ref Range   POC Glucose 216 (A) 70 - 99 mg/dl   Lab Results  Component Value Date   HGBA1C 9.0 02/18/2018     ASSESSMENT AND PLAN: 1. Uncontrolled type 2 diabetes mellitus with complication, with long-term current use of insulin ([place]) -Encourage compliance with medication. Looks like his insurance would not cover the freestyle libre meter Take Lantus 50 units daily.  Humalog 23 units with meals.  I have spoken with our clinical pharmacist.  It looks like co-pay for both insulins would be much less if he uses his insurance preferred pharmacy or mail order pharmacy.  I have informed patient of this information and encouraged him to call his insurance and find out who is their preferred pharmacy and do they provide/, for 30-day supply on his insulin.  He will call me back or send me a my chart message with this information - POCT glucose (manual entry)  2. Seizure disorder ([place]) Controlled on Dilantin and Keppra. I discussed return to work with him.  Last form  that I completed I indicated a return to work for July 15 with no operating any heavy machinery.  He knows that he still cannot drive until he is free of seizures for 6 months from his last episode. -Patient reports that his second eye surgery he thinks is scheduled for July 15.  If that is the case I have informed him that we can extend his return to work until after that surgery.  He will let me know if there is any new forms that need to be completed for this extension.  3. 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  Patient verbalized understanding of the plan and was able to repeat key elements of the plan.   Orders Placed This Encounter  Procedures  . POCT glucose (manual entry)     Requested Prescriptions    No prescriptions requested or ordered in this encounter    Return in about 2 months (around 07/06/2018).  Karle Plumber, MD, FACP

## 2018-04-27 NOTE — Patient Instructions (Signed)
Please call your insurance and find out who is there preferred pharmacy and whether it is cheaper to get 3 month supply at a time of your insulins.

## 2018-04-27 NOTE — BH Specialist Note (Signed)
  Integrated Behavioral Health Follow Up Visit  MRN: [id number] Name: Albert LeavensJames A Lankford  Number of Integrated Behavioral Health Clinician visits: 1/6 Session Start time: 8:45 AM  Session End time: 9:40 AM Total time: 55 minutes  Type of Service: Integrated Behavioral Health- Individual/Family Interpretor:No. Interpretor Name and Language: N/A  SUBJECTIVE: Albert Garcia is a 47 y.o. male accompanied by self Patient was referred by Dr. Laural BenesJohnson for anxiety. Patient reports the following symptoms/concerns: little interest in doing things, feelings of sadness and worry, difficulty sleeping, low energy, and irritability Duration of problem: Ongoing; Severity of problem: moderate  OBJECTIVE: Mood: Anxious and Affect: Appropriate Risk of harm to self or others: No plan to harm self or others  LIFE CONTEXT: Family and Social: Pt has five sons and one daughter. He receives support from family, friends, and partner School/Work: Pt is employed; however, is currently on short term-leave due to medical conditions Self-Care: No report of substance use.  Life Changes: Pt reports having a recent eye exam. Results indicated that he needs to have laser surgery on each eye. Pt's first surgery is scheduled for today and the next surgery is in two weeks.   GOALS ADDRESSED: Patient will: 1.  Reduce symptoms of: anxiety, depression and stress  2.  Increase knowledge and/or ability of: self-management skills  3.  Demonstrate ability to: Increase healthy adjustment to current life circumstances and Improve medication compliance  INTERVENTIONS: Interventions utilized:  Solution-Focused Strategies and Psychoeducation and/or Health Education Standardized Assessments completed: GAD-7 and PHQ 2&9  ASSESSMENT: Patient currently experiencing depression and anxiety triggered by difficulty managing his ongoing medical conditions and upcoming surgeries on both eyes. He reports interest in doing things, feelings of  sadness and worry, difficulty sleeping, low energy, and irritability.   Patient shared that he has observed a positive improvement on mood and that his vitamin d levels are sufficient. He reported difficulty in managing blood sugars due to diet; however, understands how this will negatively impact his physical health. LCSWA discussed different self-management strategies to strengthen pt's ability to meet goals. Pt was also encouraged to implement healthy coping skills to assist with anxiety regarding upcoming surgeries.   PLAN: 1. Follow up with behavioral health clinician on : Pt was encouraged to contact LCSWA if symptoms worsen or fail to improve to [facility]. 2. Behavioral recommendations: LCSWA recommends that pt apply healthy coping skills and strategies to assist with identified goals. Pt is encouraged to schedule follow up appointment with LCSWA 3. Referral(s): [facility] 4. "From scale of 1-10, how likely are you to follow plan?": 10/10  Bridgett LarssonJasmine D Blondie Riggsbee, LCSW 04/28/18 3:12 PM

## 2018-04-30 ENCOUNTER — Ambulatory Visit: Payer: BLUE CROSS/BLUE SHIELD | Admitting: Neurology

## 2018-04-30 ENCOUNTER — Ambulatory Visit: Payer: BLUE CROSS/BLUE SHIELD | Admitting: Diagnostic Neuroimaging

## 2018-05-14 ENCOUNTER — Encounter: Payer: Self-pay | Admitting: Internal Medicine

## 2018-05-17 ENCOUNTER — Encounter: Payer: Self-pay | Admitting: Internal Medicine

## 2018-05-17 MED FILL — ATORVASTATIN 80 MG TABLET: 80 | 30 days supply | Qty: 30 | Fill #1 | Status: TO

## 2018-05-17 MED FILL — HYDROCHLOROTHIAZIDE 25 MG T: 25 | 30 days supply | Qty: 30 | Fill #2 | Status: TO

## 2018-05-17 MED FILL — LISINOPRIL 10 MG TABS: 10 | 30 days supply | Qty: 30 | Fill #2 | Status: TO

## 2018-05-17 MED FILL — AMLODIPINE BESYLATE 10 MG T: 10 | 30 days supply | Qty: 30 | Fill #1 | Status: TO

## 2018-05-17 MED FILL — GABAPENTIN 300 MG CAPSULE: 300 | 30 days supply | Qty: 30 | Fill #1 | Status: TO

## 2018-05-18 ENCOUNTER — Other Ambulatory Visit: Payer: Self-pay | Admitting: Pharmacist

## 2018-05-18 MED ORDER — INSULIN LISPRO 200 UNIT/ML ~~LOC~~ SOPN: 23.0000 [IU] | PEN_INJECTOR | Freq: Three times a day (TID) | SUBCUTANEOUS | 6 refills | Status: DC

## 2018-05-18 MED FILL — HUMALOG 200 UNITS/ML KWIKPE: 200 | 17 days supply | Qty: 6 | Fill #0 | Status: TO

## 2018-05-18 NOTE — Progress Notes (Signed)
Pt is on 23 units TID of Humalog. Insurance will pay for U-200 kwikpen but not U-100. No dose conversion necessary. Will send in for U-200 Humalog per auto-sub policy.

## 2018-05-19 ENCOUNTER — Telehealth: Payer: Self-pay | Admitting: Neurology

## 2018-05-19 NOTE — Telephone Encounter (Signed)
No form was receive for Dr. Pearlean BrownieSethi to filled out

## 2018-05-19 NOTE — Telephone Encounter (Signed)
-----   Message from Micki RileyPramod S Sethi, MD sent at 05/18/2018  5:36 PM EDT ----- Joneen RoachKindly inform the patient that EEG study was normal

## 2018-05-19 NOTE — Telephone Encounter (Signed)
Called and informed the patient that EEG was normal and was not concerning of seizure activity. Pt questions how he should take medications and I reviewed that information with him. Pt verbalized understanding. Pt had no questions at this time but was encouraged to call back if questions arise.

## 2018-06-01 NOTE — Progress Notes (Signed)
[facility] [address] [place]. [place] [address] [phone]       OFFICE CONSULT NOTE  Mr. Albert Garcia Date of Birth:  30-Jan-1971 Medical Record Number:  [id number]   Referring MD: Ladell Pier Reason for Referral:  seizure  HPI: Albert Garcia is a 47 year old male who states he's had history of seizures since teenage years.he describes both staring spells as well as generalized conversions in the past and had tried phenobarbital, Depakote, carbamazepine and Dilantin had been seizure-free for more than 8 years until 2 weeks ago. He states he had an aura and deja vu  like feeling with lightheadedness. He had to sit down. He lost awareness of surroundings and fell asleep. He woke up and out and half later and felt quite tired and weak all over and this feeling lasted for 4-6 hours. The patient was previously taking Dilantin 300 mg daily which he has taken for the last couple of the kids. His Dilantin level was checked and found to be 4.5 and his primary care physician increase the Dilantin dose to 300 mg twice daily. He had a follow-up level on 02/24/18 which was 6.5. Patient denies any signs or Dilantin toxicity in the form of nausea, dizziness, gait ataxia and diplopia. Patient was seen previously on 12/27/15 by Albert Garcia endocrine no and advised to continue Dilantin 300 mg daily at that visit. The patient was previously found to have low vitamin D levels but he has been on replacement and most recent vitamin D level has been supratherapeutic. The patient has no family history of epilepsy and denies any history of perinatal infection, injury, trauma, febrile seizures. He denies drug abuse or heavy alcohol intake.e states he sleeps well every night and denies irregular sleeping or eating habits. He works with chemicals and operates heavy machinery.Review of electronic medical records shows that he had an MRI scan of the brain done in 2001 which was unremarkable and a CT scan of  the head in 2014 which was also normal. He has had no EEG is documented in the medical records recently.  Interval History: Patient is being seen today for follow-up.  He continues to take Dilantin 300 mg daily along with Keppra XR 500 mg daily.  He denies recent seizure activity and overall has been doing well.  He has been tolerating medications well without side effects.  He continues not to drive.  He has not returned to work at this time as he works with chemicals and operates heavy machinery.  He did undergo EEG monitoring which was negative for seizure activity.  Patient returns today for reevaluation.     ROS:   14 system review of systems is positive for appetite change and snoring and all other systems negative  PMH:  Past Medical History:  Diagnosis Date  . Diabetes mellitus   . Erectile dysfunction   . Headache   . Hypertension   . Seizures ([place])     Social History:  Social History   Socioeconomic History  . Marital status: Legally Separated    Spouse name: Not on file  . Number of children: 3  . Years of education: Not on file  . Highest education level: Not on file  Occupational History  . Occupation: Arts development officer: [employer]  . Financial resource strain: Not on file  . Food insecurity:    Worry: Not on file    Inability: Not on file  . Transportation  needs:    Medical: Not on file    Non-medical: Not on file  Tobacco Use  . Smoking status: Never Smoker  . Smokeless tobacco: Never Used  Substance and Sexual Activity  . Alcohol use: Yes    Alcohol/week: 0.0 oz    Comment: Occasionally throughout year  . Drug use: No  . Sexual activity: Never  Lifestyle  . Physical activity:    Days per week: Not on file    Minutes per session: Not on file  . Stress: Not on file  Relationships  . Social connections:    Talks on phone: Not on file    Gets together: Not on file    Attends religious service: Not on file    Active  member of club or organization: Not on file    Attends meetings of clubs or organizations: Not on file    Relationship status: Not on file  . Intimate partner violence:    Fear of current or ex partner: Not on file    Emotionally abused: Not on file    Physically abused: Not on file    Forced sexual activity: Not on file  Other Topics Concern  . Not on file  Social History Narrative  . Not on file    Medications:   Current Outpatient Medications on File Prior to Visit  Medication Sig Dispense Refill  . acetaminophen (ARTHRITIS PAIN RELIEF) 650 MG CR tablet Take 650 mg by mouth every 8 (eight) hours as needed for pain. Reported on 11/13/2015    . amLODipine (NORVASC) 10 MG tablet Take 1 tablet (10 mg total) by mouth daily. 90 tablet 3  . aspirin 325 MG EC tablet Take 325 mg by mouth daily.    Marland Kitchen atorvastatin (LIPITOR) 80 MG tablet Take 1 tablet (80 mg total) by mouth daily. 90 tablet 2  . Blood Glucose Monitoring Suppl (ONE TOUCH ULTRA 2) w/Device KIT Use 3 times daily 1 each 0  . Blood Glucose Monitoring Suppl (TRUE METRIX METER) w/Device KIT 1 each by Does not apply route as needed. 1 kit 0  . gabapentin (NEURONTIN) 300 MG capsule Take 1 capsule (300 mg total) by mouth at bedtime. 30 capsule 11  . glucose blood (TRUE METRIX BLOOD GLUCOSE TEST) test strip 1 each by Other route 3 (three) times daily. 100 each 11  . glucose blood test strip Use as instructed 3 times daily 100 each 2  . hydrochlorothiazide (HYDRODIURIL) 25 MG tablet Take 1 tablet (25 mg total) by mouth daily. 90 tablet 3  . Insulin Glargine (LANTUS SOLOSTAR) 100 UNIT/ML Solostar Pen Inject 50 Units into the skin at bedtime. 15 mL 5  . Insulin Lispro (HUMALOG KWIKPEN) 200 UNIT/ML SOPN Inject 23 Units into the skin 3 (three) times daily with meals. 9 mL 6  . Insulin Pen Needle 31G X 8 MM MISC Use as directed 100 each 2  . levETIRAcetam (KEPPRA XR) 500 MG 24 hr tablet Take 1 tablet (500 mg total) by mouth daily. 90 tablet 3  .  lisinopril (PRINIVIL,ZESTRIL) 10 MG tablet Take 1 tablet (10 mg total) by mouth daily. 90 tablet 3  . Multiple Vitamin (MULTIVITAMIN WITH MINERALS) TABS tablet Take 1 tablet by mouth daily. Reported on 11/13/2015    . naproxen (NAPROSYN) 500 MG tablet     . ONETOUCH DELICA LANCETS 19F MISC 1 each by Does not apply route 3 (three) times daily. 100 each 2  . phenytoin (DILANTIN) 300 MG ER capsule Take  1 capsule (300 mg total) by mouth daily. 60 capsule 3  . promethazine (PHENERGAN) 12.5 MG tablet Take 1 tablet (12.5 mg total) by mouth daily as needed for nausea or vomiting. 30 tablet 0  . sildenafil (VIAGRA) 50 MG tablet Take 0.5-1 tablets (25-50 mg total) by mouth daily as needed for erectile dysfunction. 10 tablet 6  . sitaGLIPtin-metformin (JANUMET) 50-1000 MG tablet Take 1 tablet by mouth 2 (two) times daily with a meal. 180 tablet 2  . TRUEPLUS LANCETS 28G MISC 1 each by Does not apply route 3 (three) times daily. 100 each 11   No current facility-administered medications on file prior to visit.     Allergies:  No Known Allergies  Physical Exam General: well developed, well nourished middle-age male, seated, in no evident distress Head: head normocephalic and atraumatic.   Neck: supple with no carotid or supraclavicular bruits Cardiovascular: regular rate and rhythm, no murmurs Musculoskeletal: no deformity Skin:  no rash/petichiae Vascular:  Normal pulses all extremities  Neurologic Exam Mental Status: Awake and fully alert. Oriented to place and time. Recent and remote memory intact. Attention span, concentration and fund of knowledge appropriate. Mood and affect appropriate.  Cranial Nerves: Fundoscopic exam reveals sharp disc margins. Pupils equal, briskly reactive to light. Extraocular movements full without nystagmus. Visual fields full to confrontation. Hearing intact. Facial sensation intact. Face, tongue, palate moves normally and symmetrically.  Motor: Normal bulk and tone.  Normal strength in all tested extremity muscles. Sensory.: intact to touch , pinprick , position and vibratory sensation.  Coordination: Rapid alternating movements normal in all extremities. Finger-to-nose and heel-to-shin performed accurately bilaterally. Gait and Station: Arises from chair without difficulty. Stance is normal. Gait demonstrates normal stride length and balance . Able to heel, toe and tandem walk without difficulty.  Reflexes: 1+ and symmetric. Toes downgoing.     IMAGING/STUDIES:  MR Brain WO contrast 03/31/18 IMPRESSION: Slightly abnormal MRI scan of the brain showing mild nonspecific periventricular white matter hyperintensities with the differential discussed above.  A small area of focal calcification of the falx is noted anteriorly in the midline which may represent benign calcification versus small calcified meningioma  EEG 05/07/18 Summary Normal electroencephalogram, awake, asleep and with activation procedures. There are no focal lateralizing or epileptiform features.    ASSESSMENT: 47 year old male with long-standing history of seizures since teenage years who was doing well on Dilantin and had been seizure-free for more than 7 years with recent episode of aura and possible complex partial seizure 2 weeks ago.  Patient returns today for follow-up visit and overall is doing well without recent seizure activity.    PLAN: Continue current dose of Dilantin and Keppra for seizure management and prevention I advised patient that he should not be driving or operating heavy machinery at this time as he needs to be seizure-free for 6 months per [facility].  Advised patient that he can call our office once he is 6 6 months out from his previous seizure and as long as he is doing well, we will provide him paperwork for return to work.  Patient will return in 6 months or call earlier if needed  Greater than 50% time during this 25 minute consultation visit was  spent on counseling and coordination of care about seizures and answered questions.He will return for follow-up in 3 months to see my nurse practitioner Janett Billow or call earlier if necessary  Venancio Poisson, AGNP-BC  [facility] [address] [place],  [place] [address]  Phone [phone] Fax [phone] Note: This document was prepared with digital dictation and possible smart phrase technology. Any transcriptional errors that result from this process are unintentional.

## 2018-06-02 ENCOUNTER — Ambulatory Visit (INDEPENDENT_AMBULATORY_CARE_PROVIDER_SITE_OTHER): Payer: BLUE CROSS/BLUE SHIELD | Admitting: Adult Health

## 2018-06-02 ENCOUNTER — Encounter: Payer: Self-pay | Admitting: Adult Health

## 2018-06-02 VITALS — BP 127/81 | HR 104 | Ht 68.0 in | Wt 263.0 lb

## 2018-06-02 DIAGNOSIS — G40909 Epilepsy, unspecified, not intractable, without status epilepticus: Secondary | ICD-10-CM | POA: Diagnosis not present

## 2018-06-02 DIAGNOSIS — G40309 Generalized idiopathic epilepsy and epileptic syndromes, not intractable, without status epilepticus: Secondary | ICD-10-CM | POA: Diagnosis not present

## 2018-06-02 MED ORDER — PHENYTOIN SODIUM EXTENDED 300 MG PO CAPS: 300.0000 mg | ORAL_CAPSULE | Freq: Every day | ORAL | 4 refills | Status: DC

## 2018-06-02 MED ORDER — LEVETIRACETAM ER 500 MG PO TB24: 500.0000 mg | ORAL_TABLET | Freq: Every day | ORAL | 4 refills | Status: DC

## 2018-06-02 NOTE — Patient Instructions (Addendum)
Your Plan:  Continue Dilantin and Keppra for seizure management  You still should not be driving or operating heavy machinery at this time. Once you reach 6 months without being seizure free, you can contact office and we will provide you with return to work paperwork   Follow up in 6 months or call earlier if needed     [facility] you [facility]. I hope we have been able to provide you high quality care today.  You may receive a patient satisfaction survey over the next few weeks. We would appreciate your feedback and comments so that we may continue to improve ourselves and the health of our patients.

## 2018-06-08 ENCOUNTER — Telehealth: Payer: Self-pay

## 2018-06-08 NOTE — Telephone Encounter (Signed)
Contacted [employer] for the paper I received through fax spoke with Ester and from the information she saw in the computer the case worker is looking for a work status and contain medical support for pt to perform job duties. I informed Ester that pt next scheduled appointment is 07-29-2018. Ester asked if pt was seeing someone for seizure I provided neurologist information and I informed Ester that we wouldn't be able to send neurologist notes they will need to contact neurologist to get there notes. Per Ester she will be putting a note in for case worker.   Dr. Laural BenesJohnson will hold paperwork till pt next appointment 07-29-18

## 2018-06-09 NOTE — Telephone Encounter (Signed)
Yes ma'am I will. It's not the form its just a paper stating they need treatment plan and etc. When I called Ester stated they are just needing those 2 things. I informed her that you filled out the FMLA already they said they have that but they are needing those 2 things. They will be sending a form to the neurologist

## 2018-06-10 NOTE — Progress Notes (Signed)
I agree with the above plan 

## 2018-06-16 ENCOUNTER — Telehealth: Payer: Self-pay | Admitting: Internal Medicine

## 2018-06-16 NOTE — Telephone Encounter (Signed)
Patient came in the office and dropped off paperwork please call him once complete at [phone]

## 2018-06-16 NOTE — Telephone Encounter (Signed)
I have put paperwork in Dr. Laural BenesJohnson bin

## 2018-07-01 ENCOUNTER — Other Ambulatory Visit: Payer: Self-pay

## 2018-07-22 ENCOUNTER — Encounter: Payer: Self-pay | Admitting: Internal Medicine

## 2018-07-27 ENCOUNTER — Other Ambulatory Visit: Payer: Self-pay | Admitting: Internal Medicine

## 2018-07-27 ENCOUNTER — Ambulatory Visit: Payer: BLUE CROSS/BLUE SHIELD | Attending: Family Medicine | Admitting: Family Medicine

## 2018-07-27 VITALS — BP 116/76 | HR 93 | Temp 98.5°F | Resp 17 | Ht 70.0 in | Wt 263.0 lb

## 2018-07-27 DIAGNOSIS — G40909 Epilepsy, unspecified, not intractable, without status epilepticus: Secondary | ICD-10-CM | POA: Insufficient documentation

## 2018-07-27 DIAGNOSIS — Z833 Family history of diabetes mellitus: Secondary | ICD-10-CM | POA: Insufficient documentation

## 2018-07-27 DIAGNOSIS — Z794 Long term (current) use of insulin: Secondary | ICD-10-CM | POA: Diagnosis not present

## 2018-07-27 DIAGNOSIS — E1169 Type 2 diabetes mellitus with other specified complication: Secondary | ICD-10-CM | POA: Diagnosis not present

## 2018-07-27 DIAGNOSIS — E1165 Type 2 diabetes mellitus with hyperglycemia: Secondary | ICD-10-CM | POA: Diagnosis not present

## 2018-07-27 DIAGNOSIS — I10 Essential (primary) hypertension: Secondary | ICD-10-CM

## 2018-07-27 DIAGNOSIS — E118 Type 2 diabetes mellitus with unspecified complications: Secondary | ICD-10-CM | POA: Diagnosis not present

## 2018-07-27 DIAGNOSIS — E1121 Type 2 diabetes mellitus with diabetic nephropathy: Secondary | ICD-10-CM | POA: Insufficient documentation

## 2018-07-27 DIAGNOSIS — Z8249 Family history of ischemic heart disease and other diseases of the circulatory system: Secondary | ICD-10-CM | POA: Insufficient documentation

## 2018-07-27 DIAGNOSIS — E785 Hyperlipidemia, unspecified: Secondary | ICD-10-CM

## 2018-07-27 DIAGNOSIS — E11311 Type 2 diabetes mellitus with unspecified diabetic retinopathy with macular edema: Secondary | ICD-10-CM | POA: Insufficient documentation

## 2018-07-27 DIAGNOSIS — IMO0002 Reserved for concepts with insufficient information to code with codable children: Secondary | ICD-10-CM

## 2018-07-27 DIAGNOSIS — F418 Other specified anxiety disorders: Secondary | ICD-10-CM | POA: Insufficient documentation

## 2018-07-27 DIAGNOSIS — Z6837 Body mass index (BMI) 37.0-37.9, adult: Secondary | ICD-10-CM | POA: Insufficient documentation

## 2018-07-27 DIAGNOSIS — E1140 Type 2 diabetes mellitus with diabetic neuropathy, unspecified: Secondary | ICD-10-CM | POA: Insufficient documentation

## 2018-07-27 DIAGNOSIS — Z7982 Long term (current) use of aspirin: Secondary | ICD-10-CM | POA: Insufficient documentation

## 2018-07-27 DIAGNOSIS — N529 Male erectile dysfunction, unspecified: Secondary | ICD-10-CM | POA: Insufficient documentation

## 2018-07-27 DIAGNOSIS — R252 Cramp and spasm: Secondary | ICD-10-CM | POA: Insufficient documentation

## 2018-07-27 DIAGNOSIS — E559 Vitamin D deficiency, unspecified: Secondary | ICD-10-CM | POA: Insufficient documentation

## 2018-07-27 DIAGNOSIS — Z79899 Other long term (current) drug therapy: Secondary | ICD-10-CM | POA: Insufficient documentation

## 2018-07-27 LAB — POCT GLYCOSYLATED HEMOGLOBIN (HGB A1C): HEMOGLOBIN A1C: 8.2 % — AB (ref 4.0–5.6)

## 2018-07-27 LAB — GLUCOSE, POCT (MANUAL RESULT ENTRY): POC GLUCOSE: 206 mg/dL — AB (ref 70–99)

## 2018-07-27 MED ORDER — INSULIN LISPRO 200 UNIT/ML ~~LOC~~ SOPN: 25.0000 [IU] | PEN_INJECTOR | Freq: Three times a day (TID) | SUBCUTANEOUS | 1 refills | Status: DC

## 2018-07-27 NOTE — Progress Notes (Signed)
Albert Garcia, is a 47 y.o. male  KJZ:[id number]  XYI:[id number]  DOB - 1971/08/27  CC:  Chief Complaint  Patient presents with  . Diabetes    states that FSBS have been running between 150-250. has c/o polydipsia. denies numbness/tingling in feet, polyuria, nausea, vomiting  . Hypertension    doesn't check BP at home. watching salt intake. not really active. denies chest pain, SHOB, lower extremity swelling, headaches  . Hyperlipidemia       HPI: Albert Garcia is a 47 y.o. male is here today for diabetes, hypertension, and lipid panel evaluation.   Chronic health problems include:has Type 2 diabetes mellitus, uncontrolled ([name]); OBESITY; HTN (hypertension); Seizure disorder ([name]); Erectile dysfunction; Muscle cramps; Microalbuminuric diabetic nephropathy ([name]); Hyperlipidemia associated with type 2 diabetes mellitus ([name]); Epilepsy, generalized, convulsive ([name]); Long-term use of high-risk medication; Vitamin D insufficiency; Type 2 diabetes mellitus with microalbuminuria, with long-term current use of insulin ([name]); Depression with anxiety; and Diabetic retinopathy of both eyes with macular edema associated with type 2 diabetes mellitus ([name]) on their problem list.    Today's visit  Albert Garcia monitor glucose at home. Adheres to current medication regimen.  Concern today as Humilin cost is increasing with insurance and he is uncertain if he can continue with current diabetes medication regimen. 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 BS readings remain up and down 150-200. He admits to poor diet and is inactive of exercise. He admits to sitting and sleeping during the day. Reports urinary frequency but denies visual disturbances, appetite change or increased thirst. Last A1C 9,  4 months prior. Body mass index is 37.74 kg/m.  Patient has hypertension. Compliant with medication. Efforts to reduce sodium intake. No recent edema. No home monitoring of blood pressure at home.   Patient denies shortness of breath, dizziness, chest pain, abdominal pain, nausea, vomiting, headache or new weakness.  Current medications: Current Outpatient Medications:  .  amLODipine (NORVASC) 10 MG tablet, Take 1 tablet (10 mg total) by mouth daily., Disp: 90 tablet, Rfl: 3 .  aspirin 325 MG EC tablet, Take 325 mg by mouth daily., Disp: , Rfl:  .  atorvastatin (LIPITOR) 80 MG tablet, Take 1 tablet (80 mg total) by mouth daily., Disp: 90 tablet, Rfl: 2 .  Blood Glucose Monitoring Suppl (ONE TOUCH ULTRA 2) w/Device KIT, Use 3 times daily, Disp: 1 each, Rfl: 0 .  gabapentin (NEURONTIN) 300 MG capsule, Take 1 capsule (300 mg total) by mouth at bedtime., Disp: 30 capsule, Rfl: 11 .  glucose blood test strip, Use as instructed 3 times daily, Disp: 100 each, Rfl: 2 .  hydrochlorothiazide (HYDRODIURIL) 25 MG tablet, Take 1 tablet (25 mg total) by mouth daily., Disp: 90 tablet, Rfl: 3 .  Insulin Glargine (LANTUS SOLOSTAR) 100 UNIT/ML Solostar Pen, Inject 50 Units into the skin at bedtime., Disp: 15 mL, Rfl: 5 .  Insulin Lispro (HUMALOG KWIKPEN) 200 UNIT/ML SOPN, Inject 23 Units into the skin 3 (three) times daily with meals., Disp: 9 mL, Rfl: 6 .  levETIRAcetam (KEPPRA XR) 500 MG 24 hr tablet, Take 1 tablet (500 mg total) by mouth daily., Disp: 90 tablet, Rfl: 4 .  lisinopril (PRINIVIL,ZESTRIL) 10 MG tablet, Take 1 tablet (10 mg total) by mouth daily., Disp: 90 tablet, Rfl: 3 .  Multiple Vitamin (MULTIVITAMIN WITH MINERALS) TABS tablet, Take 1 tablet by mouth daily. Reported on 11/13/2015, Disp: , Rfl:  .  naproxen (NAPROSYN) 500 MG tablet, , Disp: , Rfl:  .  ONETOUCH DELICA LANCETS 09B MISC, 1 each by Does not apply route 3 (three) times daily., Disp: 100 each, Rfl: 2 .  phenytoin (DILANTIN) 300 MG ER capsule, Take 1 capsule (300 mg total) by mouth daily., Disp: 90 capsule, Rfl: 4 .  promethazine (PHENERGAN) 12.5 MG tablet, Take 1 tablet (12.5 mg total) by mouth daily as needed for nausea or  vomiting., Disp: 30 tablet, Rfl: 0 .  sildenafil (VIAGRA) 50 MG tablet, Take 0.5-1 tablets (25-50 mg total) by mouth daily as needed for erectile dysfunction., Disp: 10 tablet, Rfl: 6 .  sitaGLIPtin-metformin (JANUMET) 50-1000 MG tablet, Take 1 tablet by mouth 2 (two) times daily with a meal., Disp: 180 tablet, Rfl: 2 .  TRUEPLUS PEN NEEDLES 31G X 6 MM MISC, USE AS DIRECTED, Disp: 100 each, Rfl: 2   Pertinent family medical history: family history includes Diabetes in his father and mother; Hypertension in his father and mother; Kidney failure in his father and mother; Lupus in his mother.    No Known Allergies  Social History   Socioeconomic History  . Marital status: Legally Separated    Spouse name: Not on file  . Number of children: 3  . Years of education: Not on file  . Highest education level: Not on file  Occupational History  . Occupation: Arts development officer: [employer]  . Financial resource strain: Not on file  . Food insecurity:    Worry: Not on file    Inability: Not on file  . Transportation needs:    Medical: Not on file    Non-medical: Not on file  Tobacco Use  . Smoking status: Never Smoker  . Smokeless tobacco: Never Used  Substance and Sexual Activity  . Alcohol use: Yes    Alcohol/week: 0.0 standard drinks    Comment: Occasionally throughout year  . Drug use: No  . Sexual activity: Never  Lifestyle  . Physical activity:    Days per week: Not on file    Minutes per session: Not on file  . Stress: Not on file  Relationships  . Social connections:    Talks on phone: Not on file    Gets together: Not on file    Attends religious service: Not on file    Active member of club or organization: Not on file    Attends meetings of clubs or organizations: Not on file    Relationship status: Not on file  . Intimate partner violence:    Fear of current or ex partner: Not on file    Emotionally abused: Not on file    Physically  abused: Not on file    Forced sexual activity: Not on file  Other Topics Concern  . Not on file  Social History Narrative  . Not on file    Review of Systems: Constitutional: Negative for fever, chills, diaphoresis, activity change, appetite change and fatigue. HENT: Negative for ear pain, nosebleeds, congestion, facial swelling, rhinorrhea, neck pain, neck stiffness and ear discharge.  Eyes: Negative for pain, discharge, redness, itching and visual disturbance. Respiratory: Negative for cough, choking, chest tightness, shortness of breath, wheezing and stridor.  Cardiovascular: Negative for chest pain, palpitations and leg swelling. Gastrointestinal: Negative for abdominal distention. Genitourinary: Negative for dysuria, urgency, frequency, hematuria, flank pain, decreased urine volume, difficulty urinating and dyspareunia.  Musculoskeletal: Negative for back pain, joint swelling, arthralgia and gait problem. Neurological: Negative for dizziness, tremors, seizures, syncope, facial asymmetry, speech difficulty, weakness, light-headedness, numbness  and headaches.  Hematological: Negative for adenopathy. Does not bruise/bleed easily. Psychiatric/Behavioral: Negative for hallucinations, behavioral problems, confusion, dysphoric mood, decreased concentration and agitation.    Objective:   Vitals:   07/27/18 1011  BP: 116/76  Pulse: 93  Resp: 17  Temp: 98.5 F (36.9 C)  SpO2: 98%    Physical Exam: Constitutional: Patient appears well-developed and well-nourished. No distress. HENT: Normocephalic, atraumatic, External right and left ear normal. Oropharynx is clear and moist.  Eyes: Conjunctivae and EOM are normal. No scleral icterus. Neck: Normal ROM. Neck supple. No JVD. No tracheal deviation. No thyromegaly. CVS: RRR, S1/S2 +, no murmurs, no gallops, no carotid bruit.  Pulmonary: Effort and breath sounds normal, no stridor, rhonchi, wheezes, rales.  Abdominal: Soft. BS +, no  distension, tenderness, rebound or guarding.  Musculoskeletal: Normal range of motion. No edema and no Neuro: Alert. Normal  muscle tone coordination.  Skin: Skin is warm and dry. No rash noted. Not diaphoretic. No erythema. No pallor. Psychiatric: Normal mood and affect. Behavior, judgment, thought content normal.  Lab Results  Component Value Date   WBC 4.4 12/06/2017   HGB 14.7 12/06/2017   HCT 40.4 12/06/2017   MCV 90.0 12/06/2017   PLT 225 12/06/2017   Lab Results  Component Value Date   CREATININE 0.70 (L) 02/18/2018   BUN 13 02/18/2018   NA 142 02/18/2018   K 3.9 02/24/2018   CL 102 02/18/2018   CO2 27 02/18/2018    Lab Results  Component Value Date   HGBA1C 8.2 (A) 07/27/2018    Lipid Panel     Component Value Date/Time   CHOL 120 12/14/2017 1012   TRIG 141 12/14/2017 1012   HDL 42 12/14/2017 1012   CHOLHDL 2.9 12/14/2017 1012   CHOLHDL 5.1 (H) 11/28/2016 1232   VLDL 70 (H) 11/28/2016 1232   LDLCALC 50 12/14/2017 1012    Diabetic Foot Exam - Simple   Simple Foot Form Diabetic Foot exam was performed with the following findings:  Yes 07/27/2018 11:29 AM  Visual Inspection No deformities, no ulcerations, no other skin breakdown bilaterally:  Yes See comments:  Yes Sensation Testing Intact to touch and monofilament testing bilaterally:  Yes Pulse Check Comments dorsalis pulse           Assessment and plan:  1. Uncontrolled type 2 diabetes mellitus with complication, with long-term current use of insulin ([place]), not at goal although stable.  A1c 1 days 8.2 which is improved from 9.  Patient encouraged to increase physical activity in efforts to lose weight.  Patient is morbidly obese.  Patient received counseled today from pharmacy and was given options to obtain his Humulin at a cheaper cost.  Prescriptions were sent over to [facility] who accepts his insurance. Aim for 30 minutes of exercise most days, with a goal of 150 minutes per week. -Glucose monitoring  at minimal of twice daily and keep a log of readings. -Commit to medication adherence and self-adjustment as needed -increase foods containing whole grains (one-half of grain intake). -saturated fat intake should be reduced -reduce intake of trans fat (lowers LDL cholesterol and increases HDL cholesterol) -Eat 4-5 small meals during the day to reduce the risk of becoming hungry.  2. Essential hypertension, stable controlled We have discussed target BP range and blood pressure goal.  We discussed the importance of compliance with medical therapy and DASH diet recommended, consequences of uncontrolled hypertension discussed.  - continue current BP medications - POCT URINALYSIS DIP (CLINITEK), negative  of protein   3. Hyperlipidemia associated with type 2 diabetes mellitus ([place]) Patient is not fasting today. Advised to schedule next follow-up early in the AM and he should fast at next visit. Encourage physical activity to facilitate weigh loss  Meds ordered this encounter  Medications  . Insulin Lispro (HUMALOG KWIKPEN) 200 UNIT/ML SOPN    Sig: Inject 25 Units into the skin 3 (three) times daily with meals for 3 days.    Dispense:  25 pen    Refill:  1    Orders Placed This Encounter  Procedures  . HgB A1c  . Glucose (CBG)  . HM Diabetes Foot Exam    Return in about 3 months (around 10/27/2018) for Diabetes and hypertension .  The patient was given clear instructions to go to ER or return to medical center if symptoms don't improve, worsen or new problems develop. The patient verbalized understanding. The patient was told to call to get lab results if they haven't heard anything in the next week.    Carroll Sage. Kenton Kingfisher, MSN, [facility] and [address] [place], [place], [place] [address] [phone]    This note has been created with Dragon speech recognition software and smart phrase technology. Any transcriptional errors are unintentional.

## 2018-07-27 NOTE — Patient Instructions (Signed)

## 2018-07-29 ENCOUNTER — Ambulatory Visit: Payer: Self-pay | Admitting: Internal Medicine

## 2018-08-04 ENCOUNTER — Encounter: Payer: Self-pay | Admitting: Internal Medicine

## 2018-08-05 ENCOUNTER — Other Ambulatory Visit: Payer: Self-pay | Admitting: Internal Medicine

## 2018-08-05 ENCOUNTER — Other Ambulatory Visit: Payer: Self-pay

## 2018-08-05 DIAGNOSIS — IMO0002 Reserved for concepts with insufficient information to code with codable children: Secondary | ICD-10-CM

## 2018-08-05 DIAGNOSIS — E1165 Type 2 diabetes mellitus with hyperglycemia: Secondary | ICD-10-CM

## 2018-08-05 DIAGNOSIS — E118 Type 2 diabetes mellitus with unspecified complications: Principal | ICD-10-CM

## 2018-08-05 DIAGNOSIS — Z794 Long term (current) use of insulin: Principal | ICD-10-CM

## 2018-08-05 MED ORDER — INSULIN PEN NEEDLE 31G X 6 MM MISC: 2 refills | Status: DC

## 2018-09-27 ENCOUNTER — Other Ambulatory Visit: Payer: Self-pay

## 2018-09-27 DIAGNOSIS — IMO0002 Reserved for concepts with insufficient information to code with codable children: Secondary | ICD-10-CM

## 2018-09-27 DIAGNOSIS — E1165 Type 2 diabetes mellitus with hyperglycemia: Secondary | ICD-10-CM

## 2018-09-27 DIAGNOSIS — Z794 Long term (current) use of insulin: Principal | ICD-10-CM

## 2018-09-27 DIAGNOSIS — E118 Type 2 diabetes mellitus with unspecified complications: Principal | ICD-10-CM

## 2018-09-27 MED ORDER — INSULIN GLARGINE 100 UNIT/ML SOLOSTAR PEN: 50.0000 [IU] | PEN_INJECTOR | Freq: Every day | SUBCUTANEOUS | 5 refills | Status: DC

## 2018-09-27 NOTE — Telephone Encounter (Signed)
Dr. Laural BenesJohnson pt pharmacy is requesting a refill for insulin last rx I seen was 02/18/18 for the 50 units at bedtime didn't know if this rx will be the same before I sent refill in

## 2018-09-29 ENCOUNTER — Other Ambulatory Visit: Payer: Self-pay

## 2018-12-07 ENCOUNTER — Ambulatory Visit: Payer: BLUE CROSS/BLUE SHIELD | Admitting: Adult Health

## 2018-12-07 ENCOUNTER — Telehealth: Payer: Self-pay

## 2018-12-07 NOTE — Telephone Encounter (Signed)
Patient no show for appt today. 
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
## 2018-12-08 ENCOUNTER — Encounter: Payer: Self-pay | Admitting: Adult Health

## 2019-01-16 ENCOUNTER — Other Ambulatory Visit: Payer: Self-pay | Admitting: Internal Medicine

## 2019-01-16 ENCOUNTER — Encounter: Payer: Self-pay | Admitting: Internal Medicine

## 2019-01-16 DIAGNOSIS — K0262 Dental caries on smooth surface penetrating into dentin: Secondary | ICD-10-CM

## 2019-01-24 ENCOUNTER — Encounter: Payer: Self-pay | Admitting: Internal Medicine

## 2019-01-25 ENCOUNTER — Telehealth: Payer: Self-pay | Admitting: Pharmacist

## 2019-01-25 ENCOUNTER — Telehealth: Payer: Self-pay | Admitting: Internal Medicine

## 2019-01-25 ENCOUNTER — Other Ambulatory Visit: Payer: Self-pay | Admitting: Pharmacist

## 2019-01-25 DIAGNOSIS — E1165 Type 2 diabetes mellitus with hyperglycemia: Principal | ICD-10-CM

## 2019-01-25 DIAGNOSIS — IMO0002 Reserved for concepts with insufficient information to code with codable children: Secondary | ICD-10-CM

## 2019-01-25 DIAGNOSIS — E1140 Type 2 diabetes mellitus with diabetic neuropathy, unspecified: Secondary | ICD-10-CM

## 2019-01-25 MED ORDER — SITAGLIPTIN PHOS-METFORMIN HCL 50-1000 MG PO TABS: 1.0000 | ORAL_TABLET | Freq: Two times a day (BID) | ORAL | 0 refills | Status: DC

## 2019-01-25 MED FILL — !JANUMET 50-1000MG TABLET: 50-1000 | 30 days supply | Qty: 60 | Fill #0

## 2019-01-25 NOTE — Telephone Encounter (Signed)
Call placed to patient. I was informed by patient's PCP that he recently lost his [employer] coverage and cannot afford Janumet. He can receive Janumet from our pharmacy. I have coordinated with the pharmacy to enroll him in patient assistance. His initial fill will be $10. Informed patient of this and he is amenable.

## 2019-01-25 NOTE — Telephone Encounter (Signed)
[facility]

## 2019-01-25 NOTE — Telephone Encounter (Signed)
-----   Message from Drucilla Chalet, RPH-CPP sent at 01/25/2019  8:06 AM EDT ----- Claris Che correct me if I'm wrong, but he can enroll in MAP here for free Janumet. May have to pay $10 for initial fill while we process paperwork. Are there any changes in this given covid-19 concerns? ----- Message ----- From: Marcine Matar, MD Sent: 01/24/2019  11:09 PM EDT To: Drucilla Chalet, RPH-CPP, #  This pt sent me a Mychart message stating he is no longer working and has loss insurance.  Needs help getting Janumet. I told him to look at [facility] to see if they have coupon and that I will reach out to one of you to see how much this med would cost him at our pharmacy now that he is uninsured.  Can one of you reach out to him?  Thanks

## 2019-02-01 ENCOUNTER — Encounter: Payer: Self-pay | Admitting: Internal Medicine

## 2019-02-01 ENCOUNTER — Ambulatory Visit: Payer: Self-pay | Attending: Internal Medicine | Admitting: Internal Medicine

## 2019-02-01 ENCOUNTER — Ambulatory Visit: Payer: Self-pay | Admitting: Internal Medicine

## 2019-02-01 ENCOUNTER — Other Ambulatory Visit: Payer: Self-pay

## 2019-02-01 VITALS — BP 125/86 | HR 96 | Temp 98.1°F | Resp 16 | Wt 266.8 lb

## 2019-02-01 DIAGNOSIS — IMO0002 Reserved for concepts with insufficient information to code with codable children: Secondary | ICD-10-CM

## 2019-02-01 DIAGNOSIS — Z794 Long term (current) use of insulin: Secondary | ICD-10-CM

## 2019-02-01 DIAGNOSIS — L309 Dermatitis, unspecified: Secondary | ICD-10-CM

## 2019-02-01 DIAGNOSIS — E1169 Type 2 diabetes mellitus with other specified complication: Secondary | ICD-10-CM

## 2019-02-01 DIAGNOSIS — G40909 Epilepsy, unspecified, not intractable, without status epilepticus: Secondary | ICD-10-CM

## 2019-02-01 DIAGNOSIS — E118 Type 2 diabetes mellitus with unspecified complications: Secondary | ICD-10-CM

## 2019-02-01 DIAGNOSIS — I10 Essential (primary) hypertension: Secondary | ICD-10-CM

## 2019-02-01 DIAGNOSIS — E785 Hyperlipidemia, unspecified: Secondary | ICD-10-CM

## 2019-02-01 DIAGNOSIS — K219 Gastro-esophageal reflux disease without esophagitis: Secondary | ICD-10-CM

## 2019-02-01 DIAGNOSIS — E1142 Type 2 diabetes mellitus with diabetic polyneuropathy: Secondary | ICD-10-CM

## 2019-02-01 DIAGNOSIS — E11319 Type 2 diabetes mellitus with unspecified diabetic retinopathy without macular edema: Secondary | ICD-10-CM

## 2019-02-01 DIAGNOSIS — E1165 Type 2 diabetes mellitus with hyperglycemia: Secondary | ICD-10-CM

## 2019-02-01 DIAGNOSIS — E1140 Type 2 diabetes mellitus with diabetic neuropathy, unspecified: Secondary | ICD-10-CM

## 2019-02-01 LAB — POCT GLYCOSYLATED HEMOGLOBIN (HGB A1C): HbA1c, POC (controlled diabetic range): 8.3 % — AB (ref 0.0–7.0)

## 2019-02-01 LAB — GLUCOSE, POCT (MANUAL RESULT ENTRY): POC Glucose: 126 mg/dl — AB (ref 70–99)

## 2019-02-01 MED ORDER — INSULIN LISPRO 200 UNIT/ML ~~LOC~~ SOPN: 25.0000 [IU] | PEN_INJECTOR | Freq: Three times a day (TID) | SUBCUTANEOUS | 11 refills | Status: DC

## 2019-02-01 MED ORDER — LISINOPRIL 10 MG PO TABS: 10.0000 mg | ORAL_TABLET | Freq: Every day | ORAL | 3 refills | Status: DC

## 2019-02-01 MED ORDER — ATORVASTATIN CALCIUM 80 MG PO TABS: 80.0000 mg | ORAL_TABLET | Freq: Every day | ORAL | 2 refills | Status: DC

## 2019-02-01 MED ORDER — HYDROCHLOROTHIAZIDE 25 MG PO TABS: 25.0000 mg | ORAL_TABLET | Freq: Every day | ORAL | 3 refills | Status: DC

## 2019-02-01 MED ORDER — GABAPENTIN 300 MG PO CAPS: 300.0000 mg | ORAL_CAPSULE | Freq: Every day | ORAL | 11 refills | Status: DC

## 2019-02-01 MED ORDER — ASPIRIN EC 81 MG PO TBEC: 81.0000 mg | DELAYED_RELEASE_TABLET | Freq: Every day | ORAL | 1 refills | Status: AC

## 2019-02-01 MED ORDER — AMLODIPINE BESYLATE 10 MG PO TABS: 10.0000 mg | ORAL_TABLET | Freq: Every day | ORAL | 3 refills | Status: DC

## 2019-02-01 MED ORDER — INSULIN GLARGINE 100 UNIT/ML SOLOSTAR PEN: 50.0000 [IU] | PEN_INJECTOR | Freq: Every day | SUBCUTANEOUS | 5 refills | Status: DC

## 2019-02-01 MED ORDER — OMEPRAZOLE 20 MG PO CPDR: 20.0000 mg | DELAYED_RELEASE_CAPSULE | Freq: Every day | ORAL | 3 refills | Status: DC

## 2019-02-01 MED ORDER — SITAGLIPTIN PHOS-METFORMIN HCL 50-1000 MG PO TABS: 1.0000 | ORAL_TABLET | Freq: Two times a day (BID) | ORAL | 2 refills | Status: DC

## 2019-02-01 MED FILL — GABAPENTIN 300 MG CAPSULE: 300 | 30 days supply | Qty: 30 | Fill #0

## 2019-02-01 MED FILL — HUMALOG 200 UNITS/ML KWIKPE: 200 | 32 days supply | Qty: 12 | Fill #0

## 2019-02-01 MED FILL — AMLODIPINE BESYLATE 10 MG T: 10 | 30 days supply | Qty: 30 | Fill #0

## 2019-02-01 MED FILL — HYDROCHLOROTHIAZIDE 25 MG T: 25 | 30 days supply | Qty: 30 | Fill #0

## 2019-02-01 MED FILL — !LANTUS SOLOSTAR 100UNITS/M: 100 | 30 days supply | Qty: 15 | Fill #0

## 2019-02-01 MED FILL — !JANUMET 50-1000MG TABLET: 50-1000 | 30 days supply | Qty: 60 | Fill #0

## 2019-02-01 MED FILL — OMEPRAZOLE 20 MG CAP: 20 | 30 days supply | Qty: 30 | Fill #0

## 2019-02-01 MED FILL — LISINOPRIL 10 MG TABS: 10 | 30 days supply | Qty: 30 | Fill #0

## 2019-02-01 MED FILL — ATORVASTATIN 80 MG TABLET: 80 | 30 days supply | Qty: 30 | Fill #0

## 2019-02-01 NOTE — Progress Notes (Signed)
Patient ID: Albert Garcia, male    DOB: Jul 08, 1971  MRN: [id number]  CC: Hypertension and Diabetes   Subjective: Albert Garcia is a 48 y.o. male who presents for chronic ds management.  Patient last seen by me in July 2019. His concerns today include:  Pt with hx of DM with BL retinopathy and neuropathy, HTN, HL, Sz, dep/anx, ED  DIABETES TYPE 2 Last A1C:   Results for orders placed or performed in visit on 02/01/19  POCT glucose (manual entry)  Result Value Ref Range   POC Glucose 126 (A) 70 - 99 mg/dl  POCT glycosylated hemoglobin (Hb A1C)  Result Value Ref Range   Hemoglobin A1C     HbA1c POC (<> result, manual entry)     HbA1c, POC (prediabetic range)     HbA1c, POC (controlled diabetic range) 8.3 (A) 0.0 - 7.0 %    Med Adherence:  '[x]'$  Yes but rationing Janumet for about 1 mh until 1 wk ago when he was able to get it through our pharmacy. Loss insurance end of 11/2018.   Medication side effects:  '[]'$  Yes    '[x]'$  No Home Monitoring?  '[]'$  Yes    '[x]'$  No.  States he is too stress because he has been out of work Home glucose results range:  Diet Adherence: '[x]'$  Yes, "I don't eat as much as before."    Exercise: '[x]'$  Yes just started walking every morning 1 wk ago   '[]'$  No Hypoglycemic episodes?: '[]'$  Yes    '[x]'$  No Numbness of the feet? '[]'$  Yes    '[x]'$  No but tingling and "sharp pains here and there." Retinopathy hx? '[x]'$  Yes    '[]'$  No Last eye exam:  Nov 2019 with  Dr. Zadie Rhine Comments:   GERD: "I'm having crazy heartburn in the last 6 mths."  Worse with  Spicy foods and spaghetti  Takes Bayer Back and Body daily "to get my Aspirin in."  He is not sure how much aspirin is continuing in each pill.  HTN:  Compliant with meds and salt restriction.  Currently on lisinopril, HCTZ, and amlodipine.  He has not taken meds as yet for today. + LE edema with some hyperpigmentation  Sz;  No recent sz.  Last sz was last yr; over 6 mths ago Compliant with Keppra and Dilantin Patient Active Problem  List   Diagnosis Date Noted  . 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 Type 2 diabetes mellitus with microalbuminuria, with long-term current use of insulin ([place]) 12/14/2017  . Depression with anxiety 12/14/2017  . Vitamin D insufficiency 11/30/2016  . Epilepsy, generalized, convulsive ([place]) 12/27/2015  . Long-term use of high-risk medication 12/27/2015  . Microalbuminuric diabetic nephropathy ([place]) 11/15/2015  . Hyperlipidemia associated with type 2 diabetes mellitus ([place]) 11/15/2015  . Erectile dysfunction 11/13/2015  . Muscle cramps 11/13/2015  . Type 2 diabetes mellitus, uncontrolled ([place]) 09/06/2007  . OBESITY 09/06/2007  . HTN (hypertension) 09/06/2007  . Seizure disorder ([place]) 09/06/2007     Current Outpatient Medications on File Prior to Visit  Medication Sig Dispense Refill  . Blood Glucose Monitoring Suppl (ONE TOUCH ULTRA 2) w/Device KIT Use 3 times daily 1 each 0  . glucose blood test strip Use as instructed 3 times daily 100 each 2  . Insulin Pen Needle (B-D UF III MINI PEN NEEDLES) 31G X 5 MM MISC USE AS DIRECTED 100 each 2  .  levETIRAcetam (KEPPRA XR) 500 MG 24 hr tablet Take 1 tablet (500 mg total) by mouth daily. 90 tablet 4  . Multiple Vitamin (MULTIVITAMIN WITH MINERALS) TABS tablet Take 1 tablet by mouth daily. Reported on 11/13/2015    . naproxen (NAPROSYN) 500 MG tablet     . ONETOUCH DELICA LANCETS 89F MISC 1 each by Does not apply route 3 (three) times daily. 100 each 2  . phenytoin (DILANTIN) 300 MG ER capsule Take 1 capsule (300 mg total) by mouth daily. 90 capsule 4  . promethazine (PHENERGAN) 12.5 MG tablet Take 1 tablet (12.5 mg total) by mouth daily as needed for nausea or vomiting. 30 tablet 0  . sildenafil (VIAGRA) 50 MG tablet Take 0.5-1 tablets (25-50 mg total) by mouth daily as needed for erectile dysfunction. 10 tablet 6   No current facility-administered medications on file  prior to visit.     No Known Allergies  Social History   Socioeconomic History  . Marital status: Legally Separated    Spouse name: Not on file  . Number of children: 3  . Years of education: Not on file  . Highest education level: Not on file  Occupational History  . Occupation: Arts development officer: [employer]  . Financial resource strain: Not on file  . Food insecurity:    Worry: Not on file    Inability: Not on file  . Transportation needs:    Medical: Not on file    Non-medical: Not on file  Tobacco Use  . Smoking status: Never Smoker  . Smokeless tobacco: Never Used  Substance and Sexual Activity  . Alcohol use: Yes    Alcohol/week: 0.0 standard drinks    Comment: Occasionally throughout year  . Drug use: No  . Sexual activity: Never  Lifestyle  . Physical activity:    Days per week: Not on file    Minutes per session: Not on file  . Stress: Not on file  Relationships  . Social connections:    Talks on phone: Not on file    Gets together: Not on file    Attends religious service: Not on file    Active member of club or organization: Not on file    Attends meetings of clubs or organizations: Not on file    Relationship status: Not on file  . Intimate partner violence:    Fear of current or ex partner: Not on file    Emotionally abused: Not on file    Physically abused: Not on file    Forced sexual activity: Not on file  Other Topics Concern  . Not on file  Social History Narrative  . Not on file    Family History  Problem Relation Age of Onset  . Hypertension Mother   . Diabetes Mother   . Lupus Mother   . Kidney failure Mother   . Diabetes Father   . Hypertension Father   . Kidney failure Father     Past Surgical History:  Procedure Laterality Date  . NO PAST SURGERIES    . REFRACTIVE SURGERY Bilateral 04/2018    ROS: Review of Systems Negative except as stated above  PHYSICAL EXAM: BP 125/86   Pulse 96    Temp 98.1 F (36.7 C) (Oral)   Resp 16   Wt 266 lb 12.8 oz (121 kg)   SpO2 97%   BMI 38.28 kg/m   Physical Exam  General appearance - alert, well appearing, and  in no distress Mental status - normal mood, behavior, speech, dress, motor activity, and thought processes Mouth - mucous membranes moist, pharynx normal without lesions Chest - clear to auscultation, no wheezes, rales or rhonchi, symmetric air entry Heart - normal rate, regular rhythm, normal S1, S2, no murmurs, rubs, clicks or gallops Extremities -Trace LE edema BL with slight hyperpigmentation on the lateral aspect of both lower legs Diabetic Foot Exam - Simple   Simple Foot Form Visual Inspection See comments:  Yes Sensation Testing Intact to touch and monofilament testing bilaterally:  Yes Pulse Check Posterior Tibialis and Dorsalis pulse intact bilaterally:  Yes Comments Toe nails are thick and discolored     CMP Latest Ref Rng & Units 02/24/2018 02/18/2018 12/06/2017  Glucose 65 - 99 mg/dL - 180(H) 411(H)  BUN 6 - 24 mg/dL - 13 16  Creatinine 0.76 - 1.27 mg/dL - 0.70(L) 0.89  Sodium 134 - 144 mmol/L - 142 133(L)  Potassium 3.5 - 5.2 mmol/L 3.9 4.1 3.7  Chloride 96 - 106 mmol/L - 102 98(L)  CO2 20 - 29 mmol/L - 27 25  Calcium 8.7 - 10.2 mg/dL - 9.5 9.6  Total Protein 6.0 - 8.5 g/dL - 7.4 -  Total Bilirubin 0.0 - 1.2 mg/dL - 0.3 -  Alkaline Phos 39 - 117 IU/L - 63 -  AST 0 - 40 IU/L - 34 -  ALT 0 - 44 IU/L - 70(H) -   Lipid Panel     Component Value Date/Time   CHOL 120 12/14/2017 1012   TRIG 141 12/14/2017 1012   HDL 42 12/14/2017 1012   CHOLHDL 2.9 12/14/2017 1012   CHOLHDL 5.1 (H) 11/28/2016 1232   VLDL 70 (H) 11/28/2016 1232   LDLCALC 50 12/14/2017 1012    CBC    Component Value Date/Time   WBC 4.4 12/06/2017 2310   RBC 4.49 12/06/2017 2310   HGB 14.7 12/06/2017 2310   HCT 40.4 12/06/2017 2310   PLT 225 12/06/2017 2310   MCV 90.0 12/06/2017 2310   MCH 32.7 12/06/2017 2310   MCHC 36.4 (H)  12/06/2017 2310   RDW 12.4 12/06/2017 2310   LYMPHSABS 2.5 12/06/2017 2310   MONOABS 0.4 12/06/2017 2310   EOSABS 0.1 12/06/2017 2310   BASOSABS 0.0 12/06/2017 2310    ASSESSMENT AND PLAN: 1. Uncontrolled type 2 diabetes mellitus with complication, with long-term current use of insulin (HCC) A1c today is not at goal.  However no changes made in medications as patient is not taking the Janumet twice a day for over a month and just started back taking it regularly about a week ago. We will continue Lantus and Humalog. Encouraged him to continue healthy eating habits and regular exercise. Encouraged him to check blood sugars at least twice a day before meals and bring in readings on next visit - Insulin Glargine (LANTUS SOLOSTAR) 100 UNIT/ML Solostar Pen; Inject 50 Units into the skin at bedtime.  Dispense: 15 mL; Refill: 5 - CBC - Comprehensive metabolic panel - Lipid panel - aspirin EC 81 MG tablet; Take 1 tablet (81 mg total) by mouth daily.  Dispense: 100 tablet; Refill: 1 - Microalbumin / creatinine urine ratio  2. Diabetic peripheral neuropathy associated with type 2 diabetes mellitus (HCC) Refill gabapentin - POCT glucose (manual entry) - POCT glycosylated hemoglobin (Hb A1C) - gabapentin (NEURONTIN) 300 MG capsule; Take 1 capsule (300 mg total) by mouth at bedtime.  Dispense: 30 capsule; Refill: 11 - sitaGLIPtin-metformin (JANUMET) 50-1000 MG tablet; Take  1 tablet by mouth 2 (two) times daily with a meal.  Dispense: 180 tablet; Refill: 2  3. Diabetic retinopathy of both eyes associated with type 2 diabetes mellitus, macular edema presence unspecified, unspecified retinopathy severity [facility]) Patient sees an ophthalmologist regularly.  However, there is some concern about whether he would be able to afford to continue going given that he is currently unemployed  4. Essential hypertension Not at goal but patient has not taken medicines as yet for today.  No changes made.  Continue  low-salt diet - amLODipine (NORVASC) 10 MG tablet; Take 1 tablet (10 mg total) by mouth daily.  Dispense: 90 tablet; Refill: 3 - hydrochlorothiazide (HYDRODIURIL) 25 MG tablet; Take 1 tablet (25 mg total) by mouth daily.  Dispense: 90 tablet; Refill: 3 - lisinopril (PRINIVIL,ZESTRIL) 10 MG tablet; Take 1 tablet (10 mg total) by mouth daily.  Dispense: 90 tablet; Refill: 3  5. Gastroesophageal reflux disease without esophagitis GERD precautions discussed and encouraged including foods to avoid, avoid over-the-counter NSAIDs.  I have asked him to stop the Bayer back and Body pill and change to baby ASA.  Advised to eat his last meal about 2 to 3 hours before laying down and to sleep with his head slightly elevated.  Start omeprazole. - omeprazole (PRILOSEC) 20 MG capsule; Take 1 capsule (20 mg total) by mouth daily.  Dispense: 30 capsule; Refill: 3  6. Hyperlipidemia associated with type 2 diabetes mellitus (HCC) - atorvastatin (LIPITOR) 80 MG tablet; Take 1 tablet (80 mg total) by mouth daily.  Dispense: 90 tablet; Refill: 2  7. Seizure disorder (HCC) Continue Keppra and Dilantin  8. Eczema, unspecified type Patient is using an over-the-counter intense lotion with good results   Patient was given the opportunity to ask questions.  Patient verbalized understanding of the plan and was able to repeat key elements of the plan.   Orders Placed This Encounter  Procedures  . CBC  . Comprehensive metabolic panel  . Lipid panel  . Microalbumin / creatinine urine ratio  . POCT glucose (manual entry)  . POCT glycosylated hemoglobin (Hb A1C)     Requested Prescriptions   Signed Prescriptions Disp Refills  . amLODipine (NORVASC) 10 MG tablet 90 tablet 3    Sig: Take 1 tablet (10 mg total) by mouth daily.  Marland Kitchen atorvastatin (LIPITOR) 80 MG tablet 90 tablet 2    Sig: Take 1 tablet (80 mg total) by mouth daily.  Marland Kitchen gabapentin (NEURONTIN) 300 MG capsule 30 capsule 11    Sig: Take 1 capsule (300 mg  total) by mouth at bedtime.  . hydrochlorothiazide (HYDRODIURIL) 25 MG tablet 90 tablet 3    Sig: Take 1 tablet (25 mg total) by mouth daily.  . Insulin Glargine (LANTUS SOLOSTAR) 100 UNIT/ML Solostar Pen 15 mL 5    Sig: Inject 50 Units into the skin at bedtime.  . Insulin Lispro (HUMALOG KWIKPEN) 200 UNIT/ML SOPN 25 pen 11    Sig: Inject 25 Units into the skin 3 (three) times daily with meals for 3 days.  Marland Kitchen lisinopril (PRINIVIL,ZESTRIL) 10 MG tablet 90 tablet 3    Sig: Take 1 tablet (10 mg total) by mouth daily.  . sitaGLIPtin-metformin (JANUMET) 50-1000 MG tablet 180 tablet 2    Sig: Take 1 tablet by mouth 2 (two) times daily with a meal.  . aspirin EC 81 MG tablet 100 tablet 1    Sig: Take 1 tablet (81 mg total) by mouth daily.  Marland Kitchen omeprazole (PRILOSEC) 20  MG capsule 30 capsule 3    Sig: Take 1 capsule (20 mg total) by mouth daily.    Return in about 3 months (around 05/03/2019).  Karle Plumber, MD, FACP

## 2019-02-01 NOTE — Patient Instructions (Signed)
Try to check your blood sugars at least twice a day before meals with goal being 90-130.  Decrease aspirin to 81 mg daily.  Star Omeprazole daily for acid reflux.  Gastroesophageal Reflux Disease, Adult Gastroesophageal reflux (GER) happens when acid from the stomach flows up into the tube that connects the mouth and the stomach (esophagus). Normally, food travels down the esophagus and stays in the stomach to be digested. With GER, food and stomach acid sometimes move back up into the esophagus. You may have a disease called gastroesophageal reflux disease (GERD) if the reflux:  Happens often.  Causes frequent or very bad symptoms.  Causes problems such as damage to the esophagus. When this happens, the esophagus becomes sore and swollen (inflamed). Over time, GERD can make small holes (ulcers) in the lining of the esophagus. What are the causes? This condition is caused by a problem with the muscle between the esophagus and the stomach. When this muscle is weak or not normal, it does not close properly to keep food and acid from coming back up from the stomach. The muscle can be weak because of:  Tobacco use.  Pregnancy.  Having a certain type of hernia (hiatal hernia).  Alcohol use.  Certain foods and drinks, such as coffee, chocolate, onions, and peppermint. What increases the risk? You are more likely to develop this condition if you:  Are overweight.  Have a disease that affects your connective tissue.  Use NSAID medicines. What are the signs or symptoms? Symptoms of this condition include:  Heartburn.  Difficult or painful swallowing.  The feeling of having a lump in the throat.  A bitter taste in the mouth.  Bad breath.  Having a lot of saliva.  Having an upset or bloated stomach.  Belching.  Chest pain. Different conditions can cause chest pain. Make sure you see your doctor if you have chest pain.  Shortness of breath or noisy breathing (wheezing).   Ongoing (chronic) cough or a cough at night.  Wearing away of the surface of teeth (tooth enamel).  Weight loss. How is this treated? Treatment will depend on how bad your symptoms are. Your doctor may suggest:  Changes to your diet.  Medicine.  Surgery. Follow these instructions at home: Eating and drinking   Follow a diet as told by your doctor. You may need to avoid foods and drinks such as: ? Coffee and tea (with or without caffeine). ? Drinks that contain alcohol. ? Energy drinks and sports drinks. ? Bubbly (carbonated) drinks or sodas. ? Chocolate and cocoa. ? Peppermint and mint flavorings. ? Garlic and onions. ? Horseradish. ? Spicy and acidic foods. These include peppers, chili powder, curry powder, vinegar, hot sauces, and BBQ sauce. ? Citrus fruit juices and citrus fruits, such as oranges, lemons, and limes. ? Tomato-based foods. These include red sauce, chili, salsa, and pizza with red sauce. ? Fried and fatty foods. These include donuts, french fries, potato chips, and high-fat dressings. ? High-fat meats. These include hot dogs, rib eye steak, sausage, ham, and bacon. ? High-fat dairy items, such as whole milk, butter, and cream cheese.  Eat small meals often. Avoid eating large meals.  Avoid drinking large amounts of liquid with your meals.  Avoid eating meals during the 2-3 hours before bedtime.  Avoid lying down right after you eat.  Do not exercise right after you eat. Lifestyle   Do not use any products that contain nicotine or tobacco. These include cigarettes, e-cigarettes, and chewing  tobacco. If you need help quitting, ask your doctor.  Try to lower your stress. If you need help doing this, ask your doctor.  If you are overweight, lose an amount of weight that is healthy for you. Ask your doctor about a safe weight loss goal. General instructions  Pay attention to any changes in your symptoms.  Take over-the-counter and prescription  medicines only as told by your doctor. Do not take aspirin, ibuprofen, or other NSAIDs unless your doctor says it is okay.  Wear loose clothes. Do not wear anything tight around your waist.  Raise (elevate) the head of your bed about 6 inches (15 cm).  Avoid bending over if this makes your symptoms worse.  Keep all follow-up visits as told by your doctor. This is important. Contact a doctor if:  You have new symptoms.  You lose weight and you do not know why.  You have trouble swallowing or it hurts to swallow.  You have wheezing or a cough that keeps happening.  Your symptoms do not get better with treatment.  You have a hoarse voice. Get help right away if:  You have pain in your arms, neck, jaw, teeth, or back.  You feel sweaty, dizzy, or light-headed.  You have chest pain or shortness of breath.  You throw up (vomit) and your throw-up looks like blood or coffee grounds.  You pass out (faint).  Your poop (stool) is bloody or black.  You cannot swallow, drink, or eat. Summary  If a person has gastroesophageal reflux disease (GERD), food and stomach acid move back up into the esophagus and cause symptoms or problems such as damage to the esophagus.  Treatment will depend on how bad your symptoms are.  Follow a diet as told by your doctor.  Take all medicines only as told by your doctor. This information is not intended to replace advice given to you by your health care provider. Make sure you discuss any questions you have with your health care provider. Document Released: 03/31/2008 Document Revised: 04/21/2018 Document Reviewed: 04/21/2018 Elsevier Interactive Patient Education  2019 [employer]

## 2019-02-02 LAB — COMPREHENSIVE METABOLIC PANEL
ALT: 64 IU/L — ABNORMAL HIGH (ref 0–44)
AST: 43 IU/L — ABNORMAL HIGH (ref 0–40)
Albumin/Globulin Ratio: 1.3 (ref 1.2–2.2)
Albumin: 4.2 g/dL (ref 4.0–5.0)
Alkaline Phosphatase: 56 IU/L (ref 39–117)
BUN/Creatinine Ratio: 23 — ABNORMAL HIGH (ref 9–20)
BUN: 17 mg/dL (ref 6–24)
Bilirubin Total: 0.3 mg/dL (ref 0.0–1.2)
CO2: 24 mmol/L (ref 20–29)
Calcium: 9.9 mg/dL (ref 8.7–10.2)
Chloride: 100 mmol/L (ref 96–106)
Creatinine, Ser: 0.74 mg/dL — ABNORMAL LOW (ref 0.76–1.27)
GFR calc Af Amer: 127 mL/min/{1.73_m2} (ref >59)
GFR calc non Af Amer: 110 mL/min/{1.73_m2} (ref >59)
Globulin, Total: 3.3 g/dL (ref 1.5–4.5)
Glucose: 108 mg/dL — ABNORMAL HIGH (ref 65–99)
Potassium: 4.3 mmol/L (ref 3.5–5.2)
Sodium: 139 mmol/L (ref 134–144)
Total Protein: 7.5 g/dL (ref 6.0–8.5)

## 2019-02-02 LAB — MICROALBUMIN / CREATININE URINE RATIO
Creatinine, Urine: 362.4 mg/dL
Microalb/Creat Ratio: 62 mg/g creat — ABNORMAL HIGH (ref 0–29)
Microalbumin, Urine: 223.5 ug/mL

## 2019-02-02 LAB — LIPID PANEL
Chol/HDL Ratio: 5.2 ratio — ABNORMAL HIGH (ref 0.0–5.0)
Cholesterol, Total: 183 mg/dL (ref 100–199)
HDL: 35 mg/dL — ABNORMAL LOW (ref >39)
LDL Calculated: 99 mg/dL (ref 0–99)
Triglycerides: 243 mg/dL — ABNORMAL HIGH (ref 0–149)
VLDL Cholesterol Cal: 49 mg/dL — ABNORMAL HIGH (ref 5–40)

## 2019-02-02 LAB — CBC
Hematocrit: 39 % (ref 37.5–51.0)
Hemoglobin: 13.7 g/dL (ref 13.0–17.7)
MCH: 32.5 pg (ref 26.6–33.0)
MCHC: 35.1 g/dL (ref 31.5–35.7)
MCV: 92 fL (ref 79–97)
Platelets: 258 10*3/uL (ref 150–450)
RBC: 4.22 x10E6/uL (ref 4.14–5.80)
RDW: 12.2 % (ref 11.6–15.4)
WBC: 5.1 10*3/uL (ref 3.4–10.8)

## 2019-02-03 ENCOUNTER — Other Ambulatory Visit: Payer: Self-pay | Admitting: Internal Medicine

## 2019-02-03 MED ORDER — INSULIN LISPRO (1 UNIT DIAL) 100 UNIT/ML (KWIKPEN): 25.0000 [IU] | PEN_INJECTOR | Freq: Three times a day (TID) | SUBCUTANEOUS | 11 refills | Status: DC

## 2019-02-18 ENCOUNTER — Encounter: Payer: Self-pay | Admitting: Internal Medicine

## 2019-03-03 ENCOUNTER — Encounter: Payer: Self-pay | Admitting: Internal Medicine

## 2019-03-09 MED FILL — OMEPRAZOLE 20 MG CAP: 20 | 30 days supply | Qty: 30 | Fill #1

## 2019-03-09 MED FILL — AMLODIPINE BESYLATE 10 MG T: 10 | 30 days supply | Qty: 30 | Fill #1

## 2019-03-09 MED FILL — LISINOPRIL 10 MG TABS: 10 | 30 days supply | Qty: 30 | Fill #1

## 2019-03-09 MED FILL — !JANUMET 50-1000MG TABLET: 50-1000 | 30 days supply | Qty: 60 | Fill #1

## 2019-03-09 MED FILL — !LANTUS SOLOSTAR 100UNITS/M: 100 | 30 days supply | Qty: 15 | Fill #1

## 2019-03-09 MED FILL — GABAPENTIN 300 MG CAPSULE: 300 | 30 days supply | Qty: 30 | Fill #1

## 2019-03-09 MED FILL — HYDROCHLOROTHIAZIDE 25 MG T: 25 | 30 days supply | Qty: 30 | Fill #1

## 2019-03-09 MED FILL — ATORVASTATIN 80 MG TABLET: 80 | 30 days supply | Qty: 30 | Fill #1

## 2019-03-16 DIAGNOSIS — Z0271 Encounter for disability determination: Secondary | ICD-10-CM

## 2019-03-20 ENCOUNTER — Emergency Department (HOSPITAL_BASED_OUTPATIENT_CLINIC_OR_DEPARTMENT_OTHER)
Admission: EM | Admit: 2019-03-20 | Discharge: 2019-03-20 | Disposition: A | Discharge status: Home / Self Care | Payer: Self-pay | Attending: Emergency Medicine | Admitting: Emergency Medicine

## 2019-03-20 ENCOUNTER — Other Ambulatory Visit: Payer: Self-pay

## 2019-03-20 ENCOUNTER — Encounter (HOSPITAL_BASED_OUTPATIENT_CLINIC_OR_DEPARTMENT_OTHER): Payer: Self-pay | Admitting: Emergency Medicine

## 2019-03-20 DIAGNOSIS — Y939 Activity, unspecified: Secondary | ICD-10-CM | POA: Insufficient documentation

## 2019-03-20 DIAGNOSIS — I10 Essential (primary) hypertension: Secondary | ICD-10-CM | POA: Insufficient documentation

## 2019-03-20 DIAGNOSIS — S81802A Unspecified open wound, left lower leg, initial encounter: Secondary | ICD-10-CM | POA: Insufficient documentation

## 2019-03-20 DIAGNOSIS — Y929 Unspecified place or not applicable: Secondary | ICD-10-CM | POA: Insufficient documentation

## 2019-03-20 DIAGNOSIS — M7989 Other specified soft tissue disorders: Secondary | ICD-10-CM | POA: Insufficient documentation

## 2019-03-20 DIAGNOSIS — E119 Type 2 diabetes mellitus without complications: Secondary | ICD-10-CM | POA: Insufficient documentation

## 2019-03-20 DIAGNOSIS — Y999 Unspecified external cause status: Secondary | ICD-10-CM | POA: Insufficient documentation

## 2019-03-20 DIAGNOSIS — X58XXXA Exposure to other specified factors, initial encounter: Secondary | ICD-10-CM | POA: Insufficient documentation

## 2019-03-20 DIAGNOSIS — Z794 Long term (current) use of insulin: Secondary | ICD-10-CM | POA: Insufficient documentation

## 2019-03-20 DIAGNOSIS — Z7982 Long term (current) use of aspirin: Secondary | ICD-10-CM | POA: Insufficient documentation

## 2019-03-20 DIAGNOSIS — I872 Venous insufficiency (chronic) (peripheral): Secondary | ICD-10-CM | POA: Insufficient documentation

## 2019-03-20 DIAGNOSIS — E113213 Type 2 diabetes mellitus with mild nonproliferative diabetic retinopathy with macular edema, bilateral: Secondary | ICD-10-CM | POA: Insufficient documentation

## 2019-03-20 LAB — CBG MONITORING, ED: Glucose-Capillary: 213 mg/dL — ABNORMAL HIGH (ref 70–99)

## 2019-03-20 NOTE — ED Notes (Addendum)
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 Leg is swollen with 2+ pitting edema and a new wound forming on lateral side of calf. States that wound is new and this and the swelling has never happened until he started this new job on his feet. He states he goes up and down stairs and is standing for almost all of the 8 hours of his shift.

## 2019-03-20 NOTE — Discharge Instructions (Signed)
The wound on your left leg seems to be from where your boot is rubbing.  Your leg is most likely swelling due to your veins not working well.  Wearing a compression sock while at work and making sure the area around the top of your boot is well-padded should help with this.  It would also be a good idea to wear a compression sock on the other foot as well.  If things get worse or the wound on your leg starts to get bigger, starts to drain or become red please return for reevaluation.  When you are not on your feet elevating your legs should also help with the swelling.

## 2019-03-20 NOTE — ED Provider Notes (Signed)
[facility] DEPART[facility] Provider Note   CSN: [id number] Arrival date & time: 03/20/19  4098    History   Chief Complaint Chief Complaint  Patient presents with  . Leg Swelling    HPI Albert Garcia is a 48 y.o. male.     Patient is a 48 year old male with a history of diabetes, hypertension and seizures presenting today with left lower extremity swelling.  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  He is on his feet most of the day with a 20-minute break and is also required to wear steel toed boots.  Approximately 2 days ago he noticed a wound on his left lower leg and also noticed that his left leg was swelling.  He states it is better when he wakes up in the morning but by the end of the day his foot is extremely swollen admits hard to get a shoe on.  He has not noticed any redness of his leg, drainage from the sore, fever or pain.  He is not having any calf pain or upper leg pain.  He is having no chest pain, shortness of breath or any other complaints.  He has been more active in the last week than what he normally is and denies any recent immobilization or surgery.  He has no history of clot.  The history is provided by the patient.    Past Medical History:  Diagnosis Date  . Diabetes mellitus   . Erectile dysfunction   . Headache   . Hypertension   . Seizures [facility])     Patient Active Problem List   Diagnosis Date Noted  . Diabetic retinopathy of both eyes with macular edema associated with type 2 diabetes mellitus ([place]) 04/27/2018  . Type 2 diabetes mellitus with microalbuminuria, with long-term current use of insulin ([place]) 12/14/2017  . Depression with anxiety 12/14/2017  . Vitamin D insufficiency 11/30/2016  . Epilepsy, generalized, convulsive ([place]) 12/27/2015  . Long-term use of high-risk medication 12/27/2015  . Microalbuminuric diabetic nephropathy ([place]) 11/15/2015  . Hyperlipidemia associated with type 2  diabetes mellitus ([place]) 11/15/2015  . Erectile dysfunction 11/13/2015  . Muscle cramps 11/13/2015  . Type 2 diabetes mellitus, uncontrolled ([place]) 09/06/2007  . OBESITY 09/06/2007  . HTN (hypertension) 09/06/2007  . Seizure disorder ([place]) 09/06/2007    Past Surgical History:  Procedure Laterality Date  . NO PAST SURGERIES    . REFRACTIVE SURGERY Bilateral 04/2018        Home Medications    Prior to Admission medications   Medication Sig Start Date End Date Taking? Authorizing Provider  amLODipine (NORVASC) 10 MG tablet Take 1 tablet (10 mg total) by mouth daily. 02/01/19   Ladell Pier, MD  aspirin EC 81 MG tablet Take 1 tablet (81 mg total) by mouth daily. 02/01/19   Ladell Pier, MD  atorvastatin (LIPITOR) 80 MG tablet Take 1 tablet (80 mg total) by mouth daily. 02/01/19   Ladell Pier, MD  Blood Glucose Monitoring Suppl (ONE TOUCH ULTRA 2) w/Device KIT Use 3 times daily 01/14/18   Charlott Rakes, MD  gabapentin (NEURONTIN) 300 MG capsule Take 1 capsule (300 mg total) by mouth at bedtime. 02/01/19   Ladell Pier, MD  glucose blood test strip Use as instructed 3 times daily 01/13/18   Charlott Rakes, MD  hydrochlorothiazide (HYDRODIURIL) 25 MG tablet Take 1 tablet (25 mg total) by mouth daily. 02/01/19  Ladell Pier, MD  Insulin Glargine (LANTUS SOLOSTAR) 100 UNIT/ML Solostar Pen Inject 50 Units into the skin at bedtime. 02/01/19   Ladell Pier, MD  insulin lispro (HUMALOG KWIKPEN) 100 UNIT/ML KwikPen Inject 0.25 mLs (25 Units total) into the skin 3 (three) times daily. 02/03/19   Ladell Pier, MD  Insulin Pen Needle (B-D UF III MINI PEN NEEDLES) 31G X 5 MM MISC USE AS DIRECTED 08/05/18   Ladell Pier, MD  levETIRAcetam (KEPPRA XR) 500 MG 24 hr tablet Take 1 tablet (500 mg total) by mouth daily. 06/02/18   Venancio Poisson, NP  lisinopril (PRINIVIL,ZESTRIL) 10 MG tablet Take 1 tablet (10 mg total) by mouth daily. 02/01/19   Ladell Pier, MD   Multiple Vitamin (MULTIVITAMIN WITH MINERALS) TABS tablet Take 1 tablet by mouth daily. Reported on 11/13/2015    [provider]  naproxen (NAPROSYN) 500 MG tablet  09/02/16   [provider]  omeprazole (PRILOSEC) 20 MG capsule Take 1 capsule (20 mg total) by mouth daily. 02/01/19   Ladell Pier, MD  [facility] DELICA LANCETS 33A MISC 1 each by Does not apply route 3 (three) times daily. 01/13/18   Charlott Rakes, MD  phenytoin (DILANTIN) 300 MG ER capsule Take 1 capsule (300 mg total) by mouth daily. 06/02/18   Venancio Poisson, NP  promethazine (PHENERGAN) 12.5 MG tablet Take 1 tablet (12.5 mg total) by mouth daily as needed for nausea or vomiting. 03/25/18   Ladell Pier, MD  sildenafil (VIAGRA) 50 MG tablet Take 0.5-1 tablets (25-50 mg total) by mouth daily as needed for erectile dysfunction. 12/14/17   Alfonse Spruce, FNP  sitaGLIPtin-metformin (JANUMET) 50-1000 MG tablet Take 1 tablet by mouth 2 (two) times daily with a meal. 02/01/19   Ladell Pier, MD    Family History Family History  Problem Relation Age of Onset  . Hypertension Mother   . Diabetes Mother   . Lupus Mother   . Kidney failure Mother   . Diabetes Father   . Hypertension Father   . Kidney failure Father     Social History Social History   Tobacco Use  . Smoking status: Never Smoker  . Smokeless tobacco: Never Used  Substance Use Topics  . Alcohol use: Yes    Alcohol/week: 0.0 standard drinks    Comment: Occasionally throughout year  . Drug use: No     Allergies   Patient has no known allergies.   Review of Systems Review of Systems  All other systems reviewed and are negative.    Physical Exam Updated Vital Signs BP 136/78 (BP Location: Right Arm)   Pulse 97   Temp 98.3 F (36.8 C) (Oral)   Resp 18   Ht '5\' 8"'  (1.727 m)   Wt 117.9 kg   SpO2 97%   BMI 39.53 kg/m   Physical Exam Constitutional:      General: He is not in acute distress.    Appearance:  Normal appearance. He is obese.  HENT:     Head: Normocephalic.  Cardiovascular:     Rate and Rhythm: Normal rate.  Pulmonary:     Effort: Pulmonary effort is normal.  Musculoskeletal:        General: No tenderness.     Left lower leg: Edema present.       Legs:     Comments: 1+ nonpitting edema in the left lower extremity below the knee.  2+ DP pulse.  Sensation intact without  evidence of skin breakdown over the toes or foot.  No calf or thigh tenderness on the left  Skin:    General: Skin is warm.     Capillary Refill: Capillary refill takes less than 2 seconds.  Neurological:     General: No focal deficit present.     Mental Status: He is alert and oriented to person, place, and time. Mental status is at baseline.  Psychiatric:        Mood and Affect: Mood normal.        Behavior: Behavior normal.        Thought Content: Thought content normal.      ED Treatments / Results  Labs (all labs ordered are listed, but only abnormal results are displayed) Labs Reviewed  CBG MONITORING, ED - Abnormal; Notable for the following components:      Result Value   Glucose-Capillary 213 (*)    All other components within normal limits    EKG None  Radiology No results found.  Procedures Procedures (including critical care time)  Medications Ordered in ED Medications - No data to display   Initial Impression / Assessment and Plan / ED Course  I have reviewed the triage vital signs and the nursing notes.  Pertinent labs & imaging results that were available during my care of the patient were reviewed by me and considered in my medical decision making (see chart for details).       Patient presenting today with symptoms most classic for venous insufficiency.  He is started a new job in the last 1 week and has been on his feet much more than what he is used to.  Also it appears because his leg has been swelling that he developed a small wound on the left side of his mid  tib-fib from where his boot is rubbing.  At this time there is no evidence of cellulitis or infection.  Lower suspicion for DVT as patient has been more active than normal, has had no immobilization, has no prior history of PE\DVT and has no calf pain or discomfort. Patient's blood sugar today is 213.  At this time there is no indication for further blood work.  Recommended that patient use compression stockings as well as ensuring there is plenty of padding around the top of his boot and that is not placed too tightly.  Also recommend elevating his legs when he can as this does improve his symptoms.  Patient was given strict return precautions if the swelling worsens he starts having pain or if the wound starts looking worse.  Final Clinical Impressions(s) / ED Diagnoses   Final diagnoses:  Left leg swelling  Leg wound, left, initial encounter  Venous insufficiency of left lower extremity    ED Discharge Orders    None       Blanchie Dessert, MD 03/20/19 [phone]

## 2019-03-20 NOTE — ED Triage Notes (Signed)
Patient arrived c/o leg swelling x 1 week with increased edema in the last day. Patient states additional numbness and tingling in that left leg.

## 2019-03-20 NOTE — ED Notes (Addendum)
Two layer compression applied by this RN, WTA. Capillary refill and comfort was checked.

## 2019-04-18 ENCOUNTER — Encounter: Payer: Self-pay | Admitting: Internal Medicine

## 2019-04-19 MED FILL — ATORVASTATIN 80 MG TABLET: 80 | 30 days supply | Qty: 30 | Fill #2

## 2019-04-19 MED FILL — !LANTUS SOLOSTAR 100UNITS/M: 100 | 30 days supply | Qty: 15 | Fill #2

## 2019-04-19 MED FILL — GABAPENTIN 300 MG CAPSULE: 300 | 30 days supply | Qty: 30 | Fill #2

## 2019-04-19 MED FILL — AMLODIPINE BESYLATE 10 MG T: 10 | 30 days supply | Qty: 30 | Fill #2

## 2019-04-19 MED FILL — HYDROCHLOROTHIAZIDE 25 MG T: 25 | 30 days supply | Qty: 30 | Fill #2

## 2019-04-19 MED FILL — !JANUMET 50-1000MG TABLET: 50-1000 | 30 days supply | Qty: 60 | Fill #2

## 2019-04-19 MED FILL — LISINOPRIL 10 MG TABS: 10 | 30 days supply | Qty: 30 | Fill #2

## 2019-04-19 MED FILL — OMEPRAZOLE 20 MG CAP: 20 | 30 days supply | Qty: 30 | Fill #2

## 2019-05-30 ENCOUNTER — Encounter: Payer: Self-pay | Admitting: Internal Medicine

## 2019-05-30 ENCOUNTER — Ambulatory Visit: Payer: Self-pay | Attending: Internal Medicine | Admitting: Internal Medicine

## 2019-05-30 ENCOUNTER — Ambulatory Visit (HOSPITAL_BASED_OUTPATIENT_CLINIC_OR_DEPARTMENT_OTHER): Payer: Self-pay | Admitting: Licensed Clinical Social Worker

## 2019-05-30 ENCOUNTER — Other Ambulatory Visit: Payer: Self-pay

## 2019-05-30 VITALS — BP 127/82 | HR 98 | Temp 98.5°F | Resp 16 | Wt 269.0 lb

## 2019-05-30 DIAGNOSIS — F419 Anxiety disorder, unspecified: Secondary | ICD-10-CM

## 2019-05-30 DIAGNOSIS — G40909 Epilepsy, unspecified, not intractable, without status epilepticus: Secondary | ICD-10-CM

## 2019-05-30 DIAGNOSIS — IMO0002 Reserved for concepts with insufficient information to code with codable children: Secondary | ICD-10-CM

## 2019-05-30 DIAGNOSIS — Z794 Long term (current) use of insulin: Secondary | ICD-10-CM

## 2019-05-30 DIAGNOSIS — E118 Type 2 diabetes mellitus with unspecified complications: Secondary | ICD-10-CM

## 2019-05-30 DIAGNOSIS — R945 Abnormal results of liver function studies: Secondary | ICD-10-CM

## 2019-05-30 DIAGNOSIS — R7989 Other specified abnormal findings of blood chemistry: Secondary | ICD-10-CM

## 2019-05-30 DIAGNOSIS — E1165 Type 2 diabetes mellitus with hyperglycemia: Secondary | ICD-10-CM

## 2019-05-30 DIAGNOSIS — F411 Generalized anxiety disorder: Secondary | ICD-10-CM

## 2019-05-30 DIAGNOSIS — F329 Major depressive disorder, single episode, unspecified: Secondary | ICD-10-CM

## 2019-05-30 DIAGNOSIS — E785 Hyperlipidemia, unspecified: Secondary | ICD-10-CM

## 2019-05-30 DIAGNOSIS — I10 Essential (primary) hypertension: Secondary | ICD-10-CM

## 2019-05-30 DIAGNOSIS — E1169 Type 2 diabetes mellitus with other specified complication: Secondary | ICD-10-CM

## 2019-05-30 DIAGNOSIS — E559 Vitamin D deficiency, unspecified: Secondary | ICD-10-CM

## 2019-05-30 LAB — POCT GLYCOSYLATED HEMOGLOBIN (HGB A1C): HbA1c, POC (controlled diabetic range): 9 % — AB (ref 0.0–7.0)

## 2019-05-30 LAB — GLUCOSE, POCT (MANUAL RESULT ENTRY): POC Glucose: 190 mg/dl — AB (ref 70–99)

## 2019-05-30 MED ORDER — INSULIN LISPRO (1 UNIT DIAL) 100 UNIT/ML (KWIKPEN): 27.0000 [IU] | PEN_INJECTOR | Freq: Three times a day (TID) | SUBCUTANEOUS | 11 refills | Status: DC

## 2019-05-30 MED ORDER — LANTUS SOLOSTAR 100 UNIT/ML ~~LOC~~ SOPN: 54.0000 [IU] | PEN_INJECTOR | Freq: Every day | SUBCUTANEOUS | 5 refills | Status: DC

## 2019-05-30 MED FILL — AMLODIPINE BESYLATE 10 MG T: 10 | 30 days supply | Qty: 30 | Fill #3

## 2019-05-30 MED FILL — LISINOPRIL 10 MG TABS: 10 | 30 days supply | Qty: 30 | Fill #3

## 2019-05-30 MED FILL — JANUMET 50-1,000 MG TABLET: 50-1000 | 30 days supply | Qty: 60 | Fill #3

## 2019-05-30 MED FILL — !LANTUS SOLOSTAR 100UNITS/M: 100 | 27 days supply | Qty: 15 | Fill #0

## 2019-05-30 MED FILL — GABAPENTIN 300 MG CAPSULE: 300 | 30 days supply | Qty: 30 | Fill #3

## 2019-05-30 MED FILL — ATORVASTATIN 80 MG TABLET: 80 | 30 days supply | Qty: 30 | Fill #3

## 2019-05-30 MED FILL — !HUMALOG 100 UNITS/ML KWIKP: 100 | 18 days supply | Qty: 15 | Fill #0

## 2019-05-30 MED FILL — HYDROCHLOROTHIAZIDE 25 MG T: 25 | 30 days supply | Qty: 30 | Fill #3

## 2019-05-30 MED FILL — OMEPRAZOLE 20 MG CAP: 20 | 30 days supply | Qty: 30 | Fill #3

## 2019-05-30 NOTE — Progress Notes (Signed)
Patient ID: Albert Garcia, male    DOB: [birth year]  MRN: [id number]  CC: Diabetes and Hypertension   Subjective: Albert Garcia is a 48 y.o. male who presents for chronic ds management His concerns today include:  Pt with hx of DM with BL retinopathy, neuropathy and microalbuminuria, HTN, HL, Sz, dep/anx, ED, GERD  DIABETES TYPE 2/Obesity Last A1C:   Results for orders placed or performed in visit on 05/30/19  POCT glucose (manual entry)  Result Value Ref Range   POC Glucose 190 (A) 70 - 99 mg/dl  POCT glycosylated hemoglobin (Hb A1C)  Result Value Ref Range   Hemoglobin A1C     HbA1c POC (<> result, manual entry)     HbA1c, POC (prediabetic range)     HbA1c, POC (controlled diabetic range) 9.0 (A) 0.0 - 7.0 %    Med Adherence:  _0  Yes -Lantus 50 units, Humalog 25 TID, Janumet Medication side effects:  _1  Yes    _2  No Home Monitoring?  _3  Yes BID     Home glucose results range:  Before first meal 150-200, before dinner range 250.  Feels bad when BS below 100. Diet Adherence: _4  Yes -cooking more, not eating as much.  Does intermittent fasting during the day.  Weight up 9 lbs since May Exercise: not getting out much to exercise due to COVID Hypoglycemic episodes?: _5  Yes    _6  No Numbness of the feet? _7  Yes occasionally.  On Gabapentin Retinopathy hx? _8  Yes    _9  No Last eye exam: last seen 08/2018.  Thinks he may be overdue but currently uninsured. Comments:   HYPERTENSION Currently taking: see medication list Med Adherence: _10  Yes    _11  No Medication side effects: _12  Yes    _13  No Adherence with salt restriction: _14  Yes    _15  No Home Monitoring?: _16  Yes    _17  No Monitoring Frequency: _18  Yes    _19  No Home BP results range: _20  Yes    _21  No SOB? _22  Yes    _23  No Chest Pain?: _24  Yes    _25  No Leg swelling?: _26  Yes    _27  No Headaches?: _28  Yes    _29  No Dizziness? _30  Yes    _31  No Comments:   SZ:  No sz in over 6 months.  He reports compliance with  Dilantin and Keppra.  HL:  Compliant with Lipitor.  Denies any muscle cramps or aches.  Elev LFT: AST and ALT mildly elevated on last blood test.  Drinks beer occasionally  GAD screen positive: Patient reports this has been due to the [place] pandemic.  He finds ways to keep stress and anxiety level down via cooking, cleaning, staying away from negative people And listening to music.  He feels that these measures work well for him  Patient Active Problem List   Diagnosis Date Noted  . Diabetic retinopathy of both eyes with macular edema associated with type 2 diabetes mellitus ([place]) 04/27/2018  . Type 2 diabetes mellitus with microalbuminuria, with long-term current use of insulin ([place]) 12/14/2017  . Depression with anxiety 12/14/2017  . Vitamin D insufficiency 11/30/2016  . Epilepsy, generalized, convulsive ([place]) 12/27/2015  . Long-term use of high-risk medication 12/27/2015  . Microalbuminuric diabetic nephropathy ([place]) 11/15/2015  . Hyperlipidemia associated with type 2 diabetes mellitus ([place]) 11/15/2015  . Erectile dysfunction 11/13/2015  . Muscle cramps 11/13/2015  . Type 2 diabetes mellitus, uncontrolled ([place]) 09/06/2007  . OBESITY 09/06/2007  . HTN (hypertension)  09/06/2007  . Seizure disorder ([place]) 09/06/2007     Current Outpatient Medications on File Prior to Visit  Medication Sig Dispense Refill  . amLODipine (NORVASC) 10 MG tablet Take 1 tablet (10 mg total) by mouth daily. 90 tablet 3  . aspirin EC 81 MG tablet Take 1 tablet (81 mg total) by mouth daily. 100 tablet 1  . atorvastatin (LIPITOR) 80 MG tablet Take 1 tablet (80 mg total) by mouth daily. 90 tablet 2  . Blood Glucose Monitoring Suppl (ONE TOUCH ULTRA 2) w/Device KIT Use 3 times daily 1 each 0  . gabapentin (NEURONTIN) 300 MG capsule Take 1 capsule (300 mg total) by mouth at bedtime. 30 capsule 11  . glucose blood test strip Use as instructed 3 times daily 100 each 2  . hydrochlorothiazide (HYDRODIURIL) 25 MG  tablet Take 1 tablet (25 mg total) by mouth daily. 90 tablet 3  . Insulin Pen Needle (B-D UF III MINI PEN NEEDLES) 31G X 5 MM MISC USE AS DIRECTED 100 each 2  . levETIRAcetam (KEPPRA XR) 500 MG 24 hr tablet Take 1 tablet (500 mg total) by mouth daily. 90 tablet 4  . lisinopril (PRINIVIL,ZESTRIL) 10 MG tablet Take 1 tablet (10 mg total) by mouth daily. 90 tablet 3  . Multiple Vitamin (MULTIVITAMIN WITH MINERALS) TABS tablet Take 1 tablet by mouth daily. Reported on 11/13/2015    . naproxen (NAPROSYN) 500 MG tablet     . omeprazole (PRILOSEC) 20 MG capsule Take 1 capsule (20 mg total) by mouth daily. 30 capsule 3  . ONETOUCH DELICA LANCETS 44Y MISC 1 each by Does not apply route 3 (three) times daily. 100 each 2  . phenytoin (DILANTIN) 300 MG ER capsule Take 1 capsule (300 mg total) by mouth daily. 90 capsule 4  . promethazine (PHENERGAN) 12.5 MG tablet Take 1 tablet (12.5 mg total) by mouth daily as needed for nausea or vomiting. 30 tablet 0  . sildenafil (VIAGRA) 50 MG tablet Take 0.5-1 tablets (25-50 mg total) by mouth daily as needed for erectile dysfunction. 10 tablet 6  . sitaGLIPtin-metformin (JANUMET) 50-1000 MG tablet Take 1 tablet by mouth 2 (two) times daily with a meal. 180 tablet 2   No current facility-administered medications on file prior to visit.     No Known Allergies  Social History   Socioeconomic History  . Marital status: Legally Separated    Spouse name: Not on file  . Number of children: 3  . Years of education: Not on file  . Highest education level: Not on file  Occupational History  . Occupation: Arts development officer: [employer]  . Financial resource strain: Not on file  . Food insecurity    Worry: Not on file    Inability: Not on file  . Transportation needs    Medical: Not on file    Non-medical: Not on file  Tobacco Use  . Smoking status: Never Smoker  . Smokeless tobacco: Never Used  Substance and Sexual Activity  .  Alcohol use: Yes    Alcohol/week: 0.0 standard drinks    Comment: Occasionally throughout year  . Drug use: No  . Sexual activity: Never  Lifestyle  . Physical activity    Days per week: Not on file    Minutes per session: Not on file  . Stress: Not on file  Relationships  . Social Herbalist on phone: Not on file    Gets together:  Not on file    Attends religious service: Not on file    Active member of club or organization: Not on file    Attends meetings of clubs or organizations: Not on file    Relationship status: Not on file  . Intimate partner violence    Fear of current or ex partner: Not on file    Emotionally abused: Not on file    Physically abused: Not on file    Forced sexual activity: Not on file  Other Topics Concern  . Not on file  Social History Narrative  . Not on file    Family History  Problem Relation Age of Onset  . Hypertension Mother   . Diabetes Mother   . Lupus Mother   . Kidney failure Mother   . Diabetes Father   . Hypertension Father   . Kidney failure Father     Past Surgical History:  Procedure Laterality Date  . NO PAST SURGERIES    . REFRACTIVE SURGERY Bilateral 04/2018    ROS: Review of Systems Negative except as stated above  PHYSICAL EXAM: BP 127/82   Pulse 98   Temp 98.5 F (36.9 C) (Oral)   Resp 16   Wt 269 lb (122 kg)   SpO2 96%   BMI 40.90 kg/m   Wt Readings from Last 3 Encounters:  05/30/19 269 lb (122 kg)  03/20/19 260 lb (117.9 kg)  02/01/19 266 lb 12.8 oz (121 kg)    Physical Exam General appearance - alert, well appearing, and in no distress Mental status - normal mood, behavior, speech, dress, motor activity, and thought processes Eyes - pupils equal and reactive, extraocular eye movements intact Mouth - mucous membranes moist, pharynx normal without lesions Neck - supple, no significant adenopathy Chest - clear to auscultation, no wheezes, rales or rhonchi, symmetric air entry Heart -  normal rate, regular rhythm, normal S1, S2, no murmurs, rubs, clicks or gallops Extremities - peripheral pulses normal, no pedal edema, no clubbing or cyanosis  Depression screen [facility] 2/9 05/30/2019 04/27/2018 03/25/2018  Decreased Interest 1 1 0  Down, Depressed, Hopeless 0 1 1  PHQ - 2 Score _0 Altered sleeping - 2 3  Tired, decreased energy - 2 1  Change in appetite - 2 3  Feeling bad or failure about yourself  - 1 0  Trouble concentrating - 0 0  Moving slowly or fidgety/restless - 0 0  Suicidal thoughts - 0 0  PHQ-9 Score - 9 8   GAD 7 : Generalized Anxiety Score 05/30/2019 04/27/2018 03/25/2018 02/18/2018  Nervous, Anxious, on Edge 0 2 0 1  Control/stop worrying _1 Worry too much - different things _2 Trouble relaxing _3 Restless _4 Easily annoyed or irritable _5 Afraid - awful might happen 1 2 0 1  Total GAD 7 Score _6 CMP Latest Ref Rng & Units 02/01/2019 02/24/2018 02/18/2018  Glucose 65 - 99 mg/dL 108(H) - 180(H)  BUN 6 - 24 mg/dL 17 - 13  Creatinine 0.76 - 1.27 mg/dL 0.74(L) - 0.70(L)  Sodium 134 - 144 mmol/L 139 - 142  Potassium 3.5 - 5.2 mmol/L 4.3 3.9 4.1  Chloride 96 - 106 mmol/L 100 - 102  CO2 20 - 29 mmol/L 24 - 27  Calcium 8.7 - 10.2 mg/dL 9.9 - 9.5  Total Protein  6.0 - 8.5 g/dL 7.5 - 7.4  Total Bilirubin 0.0 - 1.2 mg/dL 0.3 - 0.3  Alkaline Phos 39 - 117 IU/L 56 - 63  AST 0 - 40 IU/L 43(H) - 34  ALT 0 - 44 IU/L 64(H) - 70(H)   Lipid Panel     Component Value Date/Time   CHOL 183 02/01/2019 1053   TRIG 243 (H) 02/01/2019 1053   HDL 35 (L) 02/01/2019 1053   CHOLHDL 5.2 (H) 02/01/2019 1053   CHOLHDL 5.1 (H) 11/28/2016 1232   VLDL 70 (H) 11/28/2016 1232   LDLCALC 99 02/01/2019 1053    CBC    Component Value Date/Time   WBC 5.1 02/01/2019 1053   WBC 4.4 12/06/2017 2310   RBC 4.22 02/01/2019 1053   RBC 4.49 12/06/2017 2310   HGB 13.7 02/01/2019 1053   HCT 39.0 02/01/2019 1053   PLT 258 02/01/2019 1053   MCV 92  02/01/2019 1053   MCH 32.5 02/01/2019 1053   MCH 32.7 12/06/2017 2310   MCHC 35.1 02/01/2019 1053   MCHC 36.4 (H) 12/06/2017 2310   RDW 12.2 02/01/2019 1053   LYMPHSABS 2.5 12/06/2017 2310   MONOABS 0.4 12/06/2017 2310   EOSABS 0.1 12/06/2017 2310   BASOSABS 0.0 12/06/2017 2310   Results for orders placed or performed in visit on 05/30/19  POCT glucose (manual entry)  Result Value Ref Range   POC Glucose 190 (A) 70 - 99 mg/dl  POCT glycosylated hemoglobin (Hb A1C)  Result Value Ref Range   Hemoglobin A1C     HbA1c POC (<> result, manual entry)     HbA1c, POC (prediabetic range)     HbA1c, POC (controlled diabetic range) 9.0 (A) 0.0 - 7.0 %    ASSESSMENT AND PLAN: 1. Uncontrolled type 2 diabetes mellitus with complication, with long-term current use of insulin ([place]) Not at goal. Discussed the importance of regular exercise in helping to control diabetes.  Encouraged him to get out and walk 3 to 4 days a week for 30 to 40 minutes. Dietary counseling given.  Commended him on eating less, doing less takeout and cooking more Increase Lantus to 54 units daily and Humalog to 27 units with meals - POCT glucose (manual entry) - POCT glycosylated hemoglobin (Hb A1C) - insulin lispro (HUMALOG KWIKPEN) 100 UNIT/ML KwikPen; Inject 0.27 mLs (27 Units total) into the skin 3 (three) times daily.  Dispense: 15 mL; Refill: 11 - Insulin Glargine (LANTUS SOLOSTAR) 100 UNIT/ML Solostar Pen; Inject 54 Units into the skin at bedtime.  Dispense: 15 mL; Refill: 5  2. Essential hypertension Close to goal.  Continue current medications that include lisinopril, amlodipine and HCTZ  3. Hyperlipidemia associated with type 2 diabetes mellitus (HCC) Continue atorvastatin  4. Seizure disorder ([place]) Well-controlled on current medications  5. Vitamin D deficiency Patient requested recheck of vitamin D level.  He has history of vitamin D deficiency - VITAMIN D 25 Hydroxy (Vit-D Deficiency, Fractures)  6.  Abnormal LFTs Likely due to statin and AEDs.  Will monitor - Hepatic Function Panel - Hepatitis C Antibody - Hepatitis B surface antigen - Hepatitis B surface antibody,quantitative  7. Anxiety state Commended him on steps he has taken to control feeling anxious.  COVID-19 education including ways to prevent getting the virus discussed. Patient also met with LCSW today.   Patient was given the opportunity to ask questions.  Patient verbalized understanding of the plan and was able to repeat key elements of the plan.   Orders  Placed This Encounter  Procedures  . Hepatic Function Panel  . VITAMIN D 25 Hydroxy (Vit-D Deficiency, Fractures)  . Hepatitis C Antibody  . Hepatitis B surface antigen  . Hepatitis B surface antibody,quantitative  . POCT glucose (manual entry)  . POCT glycosylated hemoglobin (Hb A1C)     Requested Prescriptions   Signed Prescriptions Disp Refills  . insulin lispro (HUMALOG KWIKPEN) 100 UNIT/ML KwikPen 15 mL 11    Sig: Inject 0.27 mLs (27 Units total) into the skin 3 (three) times daily.  . Insulin Glargine (LANTUS SOLOSTAR) 100 UNIT/ML Solostar Pen 15 mL 5    Sig: Inject 54 Units into the skin at bedtime.    Return in about 3 months (around 08/30/2019).  Karle Plumber, MD, FACP

## 2019-05-30 NOTE — Patient Instructions (Signed)
Increase Lantus to 54 units daily.  Increase Humalog to 27 units with meals. Work on increasing activity level.  The goal is to get in at least about 150 minutes/week of moderate intensity exercise.

## 2019-05-30 NOTE — BH Specialist Note (Signed)
Integrated Behavioral Health Initial Visit  MRN: [id number] Name: Albert Garcia  Number of [place] Clinician visits:: 1/6 Session Start time: 10:15 AM  Session End time: 10:45 AM Total time: 30 minutes  Type of Service: [place] Interpretor:No. Interpretor Name and Language: N/A   Warm Hand Off Completed.       SUBJECTIVE: Albert Garcia is a 48 y.o. male accompanied by self Patient was referred by Dr. Wynetta Emery for depression and anxiety. Patient reports the following symptoms/concerns: Pt reports experiencing psychosocial stressors due to financial strain Duration of problem: Ongoing; Severity of problem: moderate  OBJECTIVE: Mood: Pleasant and Affect: Appropriate Risk of harm to self or others: No plan to harm self or others  LIFE CONTEXT: Family and Social: Pt resides with partner and children School/Work: Pt is appealing claim for unemployment. Reports that he was unable to return to work due to medical conditions and COVID19 Self-Care: Pt enjoys cooking, reading, and spending time with family Life Changes: Pt reports experiencing psychosocial stressors due to financial strain  GOALS ADDRESSED: Patient will: 1. Reduce symptoms of: stress 2. Increase knowledge and/or ability of: community resources  3. Demonstrate ability to: Increase healthy adjustment to current life circumstances  INTERVENTIONS: Interventions utilized: Supportive Counseling and Link to [employer]  Standardized Assessments completed: GAD-7 and PHQ 2&9  ASSESSMENT: Patient currently experiencing anxiety and depression triggered by psychosocial stressors. Pt has been unable to return to work and is appealing unemployment claim. He receives strong support from family and friends. Denies SI/HI.   Patient may benefit from linkage to community resources. LCSW discussed local resources to assist with food insecurity to assist patient with  management of chronic medical conditions. Pt provided consent for LCSW to make referral to Legal Aid to assist with unemployment appeal.   PLAN: 1. Follow up with behavioral health clinician on : Schedule follow up appointment with LCSW, if needed 2. Behavioral recommendations: LCSW recommends pt utilize resources provided and continue with healthy coping skills to manage stress 3. Referral(s): [facility] and [employer] Resources:  Landscape architect Aid 4. "From scale of 1-10, how likely are you to follow plan?": 10  Rebekah Chesterfield, LCSW 06/03/2019 9:31 AM

## 2019-05-31 ENCOUNTER — Other Ambulatory Visit: Payer: Self-pay | Admitting: Pharmacist

## 2019-05-31 DIAGNOSIS — E1142 Type 2 diabetes mellitus with diabetic polyneuropathy: Secondary | ICD-10-CM

## 2019-05-31 LAB — HEPATITIS B SURFACE ANTIGEN: Hepatitis B Surface Ag: NEGATIVE

## 2019-05-31 LAB — HEPATITIS B SURFACE ANTIBODY, QUANTITATIVE: Hepatitis B Surf Ab Quant: 15.6 m[IU]/mL (ref >9.9)

## 2019-05-31 LAB — HEPATITIS C ANTIBODY: Hep C Virus Ab: <0.1 s/co ratio (ref 0.0–0.9)

## 2019-05-31 LAB — HEPATIC FUNCTION PANEL
ALT: 67 IU/L — ABNORMAL HIGH (ref 0–44)
AST: 56 IU/L — ABNORMAL HIGH (ref 0–40)
Albumin: 4.5 g/dL (ref 4.0–5.0)
Alkaline Phosphatase: 65 IU/L (ref 39–117)
Bilirubin Total: 0.2 mg/dL (ref 0.0–1.2)
Bilirubin, Direct: 0.09 mg/dL (ref 0.00–0.40)
Total Protein: 7.8 g/dL (ref 6.0–8.5)

## 2019-05-31 LAB — VITAMIN D 25 HYDROXY (VIT D DEFICIENCY, FRACTURES): Vit D, 25-Hydroxy: 27.3 ng/mL — ABNORMAL LOW (ref 30.0–100.0)

## 2019-05-31 MED ORDER — JANUMET 50-1000 MG PO TABS: 1.0000 | ORAL_TABLET | Freq: Two times a day (BID) | ORAL | 0 refills | Status: DC

## 2019-06-01 ENCOUNTER — Other Ambulatory Visit: Payer: Self-pay | Admitting: Internal Medicine

## 2019-06-01 DIAGNOSIS — E785 Hyperlipidemia, unspecified: Secondary | ICD-10-CM

## 2019-06-01 DIAGNOSIS — E1169 Type 2 diabetes mellitus with other specified complication: Secondary | ICD-10-CM

## 2019-06-01 MED ORDER — ATORVASTATIN CALCIUM 40 MG PO TABS: 40.0000 mg | ORAL_TABLET | Freq: Every day | ORAL | 6 refills | Status: DC

## 2019-06-01 MED ORDER — CHOLECALCIFEROL 10 MCG (400 UNIT) PO TABS: 400.0000 [IU] | ORAL_TABLET | Freq: Every day | ORAL | 0 refills | Status: DC

## 2019-06-01 MED FILL — ATORVASTATIN CALCIUM 40 MG: 40 | 30 days supply | Qty: 30 | Fill #0

## 2019-06-07 ENCOUNTER — Telehealth: Payer: Self-pay | Admitting: Internal Medicine

## 2019-06-07 NOTE — Telephone Encounter (Signed)
Patient  Called to check on the status of his paperwork. Please follow up.

## 2019-06-17 NOTE — Telephone Encounter (Signed)
LCSW was not provided any paperwork at last appointment. Legal Aid referral was completed. LCSW received confirmation of receipt. [place] please follow up with patient regarding paperwork. Thanks!

## 2019-06-17 NOTE — Telephone Encounter (Signed)
CMA Pollock transferred call to LCSW. Pt shared that he has not heard back from Legal Aid. LCSW informed him that Paralegal is currently on vacation. LCSW will follow up with referral next week. Pt was appreciative for the information. No additional concerns noted.

## 2019-06-17 NOTE — Telephone Encounter (Signed)
Pt is wanting to discuss the legal aid paperwork

## 2019-07-05 ENCOUNTER — Encounter: Payer: Self-pay | Admitting: Internal Medicine

## 2019-07-12 ENCOUNTER — Encounter: Payer: Self-pay | Admitting: Internal Medicine

## 2019-07-12 ENCOUNTER — Other Ambulatory Visit: Payer: Self-pay | Admitting: Internal Medicine

## 2019-07-12 DIAGNOSIS — K219 Gastro-esophageal reflux disease without esophagitis: Secondary | ICD-10-CM

## 2019-07-12 MED FILL — LISINOPRIL 10 MG TABS: 10 | 30 days supply | Qty: 30 | Fill #4

## 2019-07-12 MED FILL — OMEPRAZOLE 20 MG CAP: 20 | 30 days supply | Qty: 30 | Fill #0

## 2019-07-12 MED FILL — !LANTUS SOLOSTAR 100UNITS/M: 100 | 16 days supply | Qty: 9 | Fill #1

## 2019-07-12 MED FILL — JANUMET 50-1,000 MG TABLET: 50-1000 | 30 days supply | Qty: 60 | Fill #4

## 2019-07-12 MED FILL — HUMALOG 100 UNITS/ML KWIKPE: 100 | 18 days supply | Qty: 15 | Fill #1

## 2019-07-12 MED FILL — ATORVASTATIN CALCIUM 40 MG: 40 | 30 days supply | Qty: 30 | Fill #1

## 2019-07-12 MED FILL — HYDROCHLOROTHIAZIDE 25 MG T: 25 | 30 days supply | Qty: 30 | Fill #4

## 2019-07-12 MED FILL — GABAPENTIN 300 MG CAPSULE: 300 | 30 days supply | Qty: 30 | Fill #4

## 2019-07-13 ENCOUNTER — Other Ambulatory Visit: Payer: Self-pay

## 2019-07-13 ENCOUNTER — Ambulatory Visit: Payer: Self-pay | Attending: Family Medicine | Admitting: *Deleted

## 2019-07-13 DIAGNOSIS — Z23 Encounter for immunization: Secondary | ICD-10-CM

## 2019-07-15 ENCOUNTER — Ambulatory Visit: Payer: Self-pay | Admitting: Pharmacist

## 2019-07-19 ENCOUNTER — Ambulatory Visit: Payer: Self-pay | Admitting: Pharmacist

## 2019-07-21 ENCOUNTER — Encounter: Payer: Self-pay | Admitting: Internal Medicine

## 2019-07-22 ENCOUNTER — Encounter: Payer: Self-pay | Admitting: Internal Medicine

## 2019-07-22 MED FILL — ?OMEPRAZOLE 20MG CAP DR: 20 | 30 days supply | Qty: 30 | Fill #0

## 2019-07-22 MED FILL — GABAPENTIN 300 MG CAPSULE: 300 | 30 days supply | Qty: 30 | Fill #4

## 2019-07-22 MED FILL — ?BASAGLAR 100 UNITS/ML KWPE: 100 | 27 days supply | Qty: 15 | Fill #1

## 2019-07-22 MED FILL — ?AMLODIPINE BESYLATE 10 MG: 10 | 30 days supply | Qty: 30 | Fill #4

## 2019-07-22 MED FILL — ?HUMALOG 100 UNITS/ML KWIKP: 100 | 29 days supply | Qty: 24 | Fill #1

## 2019-07-22 MED FILL — JANUMET 50-1,000 MG TABLET: 50-1000 | 30 days supply | Qty: 60 | Fill #4

## 2019-07-22 MED FILL — LISINOPRIL 10 MG TABS: 10 | 30 days supply | Qty: 30 | Fill #4

## 2019-07-22 MED FILL — ?HYDROCHLOROTHIAZIDE 25MG T: 25 | 30 days supply | Qty: 30 | Fill #4

## 2019-07-22 MED FILL — ?ATORVASTATIN 40MG TABLET: 40 | 30 days supply | Qty: 30 | Fill #1

## 2019-07-27 ENCOUNTER — Encounter: Payer: Self-pay | Admitting: Internal Medicine

## 2019-07-28 MED ORDER — LEVETIRACETAM ER 500 MG PO TB24: 500.0000 mg | ORAL_TABLET | Freq: Every day | ORAL | 4 refills | Status: DC

## 2019-07-28 MED FILL — LEVETIRACETAM ER 500 MG TAB: 500 | 30 days supply | Qty: 30 | Fill #0

## 2019-08-18 ENCOUNTER — Other Ambulatory Visit: Payer: Self-pay

## 2019-08-18 ENCOUNTER — Emergency Department (HOSPITAL_BASED_OUTPATIENT_CLINIC_OR_DEPARTMENT_OTHER)
Admission: EM | Admit: 2019-08-18 | Discharge: 2019-08-18 | Disposition: A | Discharge status: Home / Self Care | Payer: Self-pay | Attending: Emergency Medicine | Admitting: Emergency Medicine

## 2019-08-18 ENCOUNTER — Encounter (HOSPITAL_BASED_OUTPATIENT_CLINIC_OR_DEPARTMENT_OTHER): Payer: Self-pay

## 2019-08-18 ENCOUNTER — Emergency Department (HOSPITAL_BASED_OUTPATIENT_CLINIC_OR_DEPARTMENT_OTHER): Payer: Self-pay

## 2019-08-18 DIAGNOSIS — Z79899 Other long term (current) drug therapy: Secondary | ICD-10-CM | POA: Insufficient documentation

## 2019-08-18 DIAGNOSIS — Z794 Long term (current) use of insulin: Secondary | ICD-10-CM | POA: Insufficient documentation

## 2019-08-18 DIAGNOSIS — Z7982 Long term (current) use of aspirin: Secondary | ICD-10-CM | POA: Insufficient documentation

## 2019-08-18 DIAGNOSIS — R2241 Localized swelling, mass and lump, right lower limb: Secondary | ICD-10-CM | POA: Insufficient documentation

## 2019-08-18 DIAGNOSIS — I10 Essential (primary) hypertension: Secondary | ICD-10-CM | POA: Insufficient documentation

## 2019-08-18 DIAGNOSIS — L089 Local infection of the skin and subcutaneous tissue, unspecified: Secondary | ICD-10-CM | POA: Insufficient documentation

## 2019-08-18 DIAGNOSIS — E119 Type 2 diabetes mellitus without complications: Secondary | ICD-10-CM | POA: Insufficient documentation

## 2019-08-18 MED ORDER — DOXYCYCLINE HYCLATE 100 MG PO CAPS: 100.0000 mg | ORAL_CAPSULE | Freq: Two times a day (BID) | ORAL | 0 refills | Status: DC

## 2019-08-18 NOTE — ED Triage Notes (Signed)
Pt c/o pain and discoloration to right 2nd toe x 2 months-stared after a new job where he wears steel toe boots-denies specific injury-NAD-steady gait

## 2019-08-19 MED FILL — GABAPENTIN 300 MG CAPSULE: 300 | 30 days supply | Qty: 30 | Fill #5

## 2019-08-19 MED FILL — LEVETIRACETAM ER 500 MG TAB: 500 | 30 days supply | Qty: 30 | Fill #0

## 2019-08-19 MED FILL — ?ATORVASTATIN 40MG TABLET: 40 | 30 days supply | Qty: 30 | Fill #2

## 2019-08-19 MED FILL — !JANUMET 50-1000MG TABLET: 50-1000 | 15 days supply | Qty: 30 | Fill #5

## 2019-08-19 MED FILL — LISINOPRIL 10 MG TABS: 10 | 30 days supply | Qty: 30 | Fill #5

## 2019-08-22 NOTE — ED Provider Notes (Signed)
[facility] EMERGENCY DEPARTMENT Provider Note   CSN: [id number] Arrival date & time: 08/18/19  1919     History   Chief Complaint Chief Complaint  Patient presents with  . Toe Pain    HPI Albert Garcia is a 48 y.o. male.     HPI 48 year old male with pain and swelling to right second toe.  Onset about 2 months ago.  Began shortly after starting a job where he wears steel toed boots.  No drainage.  No fevers or chills.  He is a diabetic. Past Medical History:  Diagnosis Date  . Diabetes mellitus   . Erectile dysfunction   . Headache   . Hypertension   . Seizures [facility])     Patient Active Problem List   Diagnosis Date Noted  . Anxiety state 05/30/2019  . Abnormal LFTs 05/30/2019  . Diabetic retinopathy of both eyes with macular edema associated with type 2 diabetes mellitus ([place]) 04/27/2018  . Type 2 diabetes mellitus with microalbuminuria, with long-term current use of insulin ([place]) 12/14/2017  . Depression with anxiety 12/14/2017  . Vitamin D insufficiency 11/30/2016  . Epilepsy, generalized, convulsive ([place]) 12/27/2015  . Long-term use of high-risk medication 12/27/2015  . Microalbuminuric diabetic nephropathy ([place]) 11/15/2015  . Hyperlipidemia associated with type 2 diabetes mellitus ([place]) 11/15/2015  . Erectile dysfunction 11/13/2015  . Muscle cramps 11/13/2015  . Type 2 diabetes mellitus, uncontrolled ([place]) 09/06/2007  . OBESITY 09/06/2007  . HTN (hypertension) 09/06/2007  . Seizure disorder ([place]) 09/06/2007    Past Surgical History:  Procedure Laterality Date  . NO PAST SURGERIES    . REFRACTIVE SURGERY Bilateral 04/2018        Home Medications    Prior to Admission medications   Medication Sig Start Date End Date Taking? Authorizing Provider  amLODipine (NORVASC) 10 MG tablet Take 1 tablet (10 mg total) by mouth daily. 02/01/19   Ladell Pier, MD  aspirin EC 81 MG tablet Take 1 tablet (81 mg total) by mouth daily. 02/01/19   Ladell Pier, MD  atorvastatin (LIPITOR) 40 MG tablet Take 1 tablet (40 mg total) by mouth daily. 06/01/19   Ladell Pier, MD  Blood Glucose Monitoring Suppl (ONE TOUCH ULTRA 2) w/Device KIT Use 3 times daily 01/14/18   Charlott Rakes, MD  cholecalciferol (VITAMIN D-400) 10 MCG (400 UNIT) TABS tablet Take 1 tablet (400 Units total) by mouth daily. 06/01/19   Ladell Pier, MD  doxycycline (VIBRAMYCIN) 100 MG capsule Take 1 capsule (100 mg total) by mouth 2 (two) times daily. 08/18/19   Virgel Manifold, MD  gabapentin (NEURONTIN) 300 MG capsule Take 1 capsule (300 mg total) by mouth at bedtime. 02/01/19   Ladell Pier, MD  glucose blood test strip Use as instructed 3 times daily 01/13/18   Charlott Rakes, MD  hydrochlorothiazide (HYDRODIURIL) 25 MG tablet Take 1 tablet (25 mg total) by mouth daily. 02/01/19   Ladell Pier, MD  Insulin Glargine (LANTUS SOLOSTAR) 100 UNIT/ML Solostar Pen Inject 54 Units into the skin at bedtime. 05/30/19   Ladell Pier, MD  insulin lispro (HUMALOG KWIKPEN) 100 UNIT/ML KwikPen Inject 0.27 mLs (27 Units total) into the skin 3 (three) times daily. 05/30/19   Ladell Pier, MD  Insulin Pen Needle (B-D UF III MINI PEN NEEDLES) 31G X 5 MM MISC USE AS DIRECTED 08/05/18   Ladell Pier, MD  levETIRAcetam (KEPPRA XR) 500 MG 24 hr tablet Take 1 tablet (500  mg total) by mouth daily. 07/28/19   Ladell Pier, MD  lisinopril (PRINIVIL,ZESTRIL) 10 MG tablet Take 1 tablet (10 mg total) by mouth daily. 02/01/19   Ladell Pier, MD  Multiple Vitamin (MULTIVITAMIN WITH MINERALS) TABS tablet Take 1 tablet by mouth daily. Reported on 11/13/2015    [provider]  naproxen (NAPROSYN) 500 MG tablet  09/02/16   [provider]  omeprazole (PRILOSEC) 20 MG capsule TAKE 1 CAPSULE (20 MG TOTAL) BY MOUTH DAILY. 07/12/19   Ladell Pier, MD  [facility] DELICA LANCETS 16R MISC 1 each by Does not apply route 3 (three) times daily. 01/13/18   Charlott Rakes, MD  phenytoin (DILANTIN) 300 MG ER capsule Take 1 capsule (300 mg total) by mouth daily. 06/02/18   Frann Rider, NP  promethazine (PHENERGAN) 12.5 MG tablet Take 1 tablet (12.5 mg total) by mouth daily as needed for nausea or vomiting. 03/25/18   Ladell Pier, MD  sildenafil (VIAGRA) 50 MG tablet Take 0.5-1 tablets (25-50 mg total) by mouth daily as needed for erectile dysfunction. 12/14/17   Alfonse Spruce, FNP  sitaGLIPtin-metformin (JANUMET) 50-1000 MG tablet Take 1 tablet by mouth 2 (two) times daily with a meal. 05/31/19   Ladell Pier, MD    Family History Family History  Problem Relation Age of Onset  . Hypertension Mother   . Diabetes Mother   . Lupus Mother   . Kidney failure Mother   . Diabetes Father   . Hypertension Father   . Kidney failure Father     Social History Social History   Tobacco Use  . Smoking status: Never Smoker  . Smokeless tobacco: Never Used  Substance Use Topics  . Alcohol use: Yes    Alcohol/week: 0.0 standard drinks    Comment: occ  . Drug use: No     Allergies   Patient has no known allergies.   Review of Systems Review of Systems  All systems reviewed and negative, other than as noted in HPI.  Physical Exam Updated Vital Signs BP 137/81 (BP Location: Right Arm)   Pulse 97   Temp 98.9 F (37.2 C) (Oral)   Resp 16   Ht '5\' 8"'  (1.727 m)   Wt 126 kg   SpO2 97%   BMI 42.22 kg/m   Physical Exam Vitals signs and nursing note reviewed.  Constitutional:      General: He is not in acute distress.    Appearance: He is well-developed.  HENT:     Head: Normocephalic and atraumatic.  Eyes:     General:        Right eye: No discharge.        Left eye: No discharge.     Conjunctiva/sclera: Conjunctivae normal.  Neck:     Musculoskeletal: Neck supple.  Cardiovascular:     Rate and Rhythm: Normal rate and regular rhythm.     Heart sounds: Normal heart sounds. No murmur. No friction rub. No gallop.    Pulmonary:     Effort: Pulmonary effort is normal. No respiratory distress.     Breath sounds: Normal breath sounds.  Abdominal:     General: There is no distension.     Palpations: Abdomen is soft.     Tenderness: There is no abdominal tenderness.  Musculoskeletal:     Comments: Swelling of the right second toe only.  Does not extend past the MTP joint.  There is a superficial ulceration over the dorsum of  the toe.  No drainage appreciated.  Easily palpable DP pulse.  Skin:    General: Skin is warm and dry.  Neurological:     Mental Status: He is alert.  Psychiatric:        Behavior: Behavior normal.        Thought Content: Thought content normal.      ED Treatments / Results  Labs (all labs ordered are listed, but only abnormal results are displayed) Labs Reviewed - No data to display  EKG None  Radiology No results found.   Dg Toe 2nd Right  Result Date: 08/18/2019 CLINICAL DATA:  Pain and discoloration of the right second toe. EXAM: RIGHT SECOND TOE COMPARISON:  None. FINDINGS: There is no evidence of fracture or dislocation. Questionable hammertoe deformity of the digits versus positioning there is no evidence of arthropathy or other focal bone abnormality. Mild generalized soft tissue prominence, no soft tissue air or radiopaque foreign body. IMPRESSION: Mild generalized soft tissue prominence without soft tissue air or radiopaque foreign body. No acute osseous abnormality. Electronically Signed   By: Keith Rake M.D.   On: 08/18/2019 20:13    Procedures Procedures (including critical care time)  Medications Ordered in ED Medications - No data to display   Initial Impression / Assessment and Plan / ED Course  I have reviewed the triage vital signs and the nursing notes.  Pertinent labs & imaging results that were available during my care of the patient were reviewed by me and considered in my medical decision making (see chart for details).        48 year old male with infected right second toe.  Possibly scar from ulceration related to wearing boots at his job.  X-ray without evidence of osseous involvement.  Will place on antibiotics.  He needs to pad/protect this area better.  Advised to follow-up with wound care.  Return precautions were discussed.  Hopefully this will heal uneventfully with antibiotics and good/consistent wound care. Final Clinical Impressions(s) / ED Diagnoses   Final diagnoses:  Toe infection    ED Discharge Orders         Ordered    doxycycline (VIBRAMYCIN) 100 MG capsule  2 times daily     08/18/19 2109           Virgel Manifold, MD 08/22/19 [phone]

## 2019-08-30 ENCOUNTER — Ambulatory Visit: Payer: Self-pay | Admitting: Internal Medicine

## 2019-10-10 ENCOUNTER — Encounter: Payer: Self-pay | Admitting: Internal Medicine

## 2019-10-10 MED FILL — ?HYDROCHLOROTHIAZIDE 25MG T: 25 | 30 days supply | Qty: 30 | Fill #5

## 2019-10-10 MED FILL — LISINOPRIL 10 MG TABS: 10 | 30 days supply | Qty: 30 | Fill #6

## 2019-10-10 MED FILL — JANUMET 50-1,000 MG TABLET: 50-1000 | 15 days supply | Qty: 30 | Fill #6

## 2019-10-10 MED FILL — ?OMEPRazole 20mg CPDR: 20 | 30 days supply | Qty: 30 | Fill #1

## 2019-10-10 MED FILL — ?BASAGLAR 100 UNITS/ML KWPE: 100 | 27 days supply | Qty: 15 | Fill #2

## 2019-10-10 MED FILL — LEVETIRACETAM ER 500 MG TAB: 500 | 30 days supply | Qty: 30 | Fill #1

## 2019-10-10 MED FILL — ?ATORVASTATIN 40MG TABLET: 40 | 30 days supply | Qty: 30 | Fill #3

## 2019-10-10 MED FILL — ?HUMALOG 100 UNITS/ML KWIKP: 100 | 29 days supply | Qty: 24 | Fill #2

## 2019-10-10 MED FILL — ?AMLODIPINE BESYLATE 10 MG: 10 | 30 days supply | Qty: 30 | Fill #5

## 2019-10-10 MED FILL — GABAPENTIN 300 MG CAPSULE: 300 | 30 days supply | Qty: 30 | Fill #6

## 2019-10-11 ENCOUNTER — Encounter: Payer: Self-pay | Admitting: Internal Medicine

## 2019-10-13 ENCOUNTER — Ambulatory Visit: Payer: Self-pay | Attending: Internal Medicine | Admitting: Internal Medicine

## 2019-10-13 ENCOUNTER — Other Ambulatory Visit: Payer: Self-pay

## 2019-10-13 ENCOUNTER — Encounter: Payer: Self-pay | Admitting: Internal Medicine

## 2019-10-13 VITALS — BP 154/99 | HR 87 | Resp 16 | Wt 266.0 lb

## 2019-10-13 DIAGNOSIS — I10 Essential (primary) hypertension: Secondary | ICD-10-CM

## 2019-10-13 DIAGNOSIS — IMO0002 Reserved for concepts with insufficient information to code with codable children: Secondary | ICD-10-CM

## 2019-10-13 DIAGNOSIS — Z794 Long term (current) use of insulin: Secondary | ICD-10-CM

## 2019-10-13 DIAGNOSIS — E1165 Type 2 diabetes mellitus with hyperglycemia: Secondary | ICD-10-CM

## 2019-10-13 DIAGNOSIS — R945 Abnormal results of liver function studies: Secondary | ICD-10-CM

## 2019-10-13 DIAGNOSIS — L98491 Non-pressure chronic ulcer of skin of other sites limited to breakdown of skin: Secondary | ICD-10-CM | POA: Insufficient documentation

## 2019-10-13 DIAGNOSIS — R7989 Other specified abnormal findings of blood chemistry: Secondary | ICD-10-CM

## 2019-10-13 LAB — POCT GLYCOSYLATED HEMOGLOBIN (HGB A1C): HbA1c, POC (controlled diabetic range): 12 % — AB (ref 0.0–7.0)

## 2019-10-13 LAB — GLUCOSE, POCT (MANUAL RESULT ENTRY): POC Glucose: 224 mg/dl — AB (ref 70–99)

## 2019-10-13 NOTE — Patient Instructions (Signed)
Go to [facility] radiology department to have the x-ray done of the right foot.  Apply 2 x 2 gauze over the area when you are wearing shoes to act as a padding to prevent friction rub on this ulcerative callus.  Please get your orange card and cone discount card reinstated as soon as possible so that we can get you to a podiatrist as soon as possible.

## 2019-10-13 NOTE — Progress Notes (Signed)
Patient ID: ATA PECHA, male    DOB: 02-Aug-1971  MRN: [id number]  CC: Foot Ulcer   Subjective: Albert Garcia is a 48 y.o. male who presents for eval of foot ulcer His concerns today include:  Pt with hx of DMwith BL retinopathy, neuropathy and microalbuminuria, HTN, HL, Sz, dep/anx, ED, GERD  Pt c/o having ulcer on RT 2nd toe x 2 mths.  No injury but wears steal boots at work Not painful, malodorous and no drainage. Seen in ER 08/18/2019 for same.  Given abx -Doxycycline.  X-ray revealed no osteomyelitis changes.  No change/healing since then.    DIABETES TYPE 2 Last A1C:   Results for orders placed or performed in visit on 10/13/19  Glucose (CBG)  Result Value Ref Range   POC Glucose 224 (A) 70 - 99 mg/dl  HgB A1c  Result Value Ref Range   Hemoglobin A1C     HbA1c POC (<> result, manual entry)     HbA1c, POC (prediabetic range)     HbA1c, POC (controlled diabetic range) 12.0 (A) 0.0 - 7.0 %    Med Adherence:  '[x]'  Yes -out of all meds except Lantus x 1 mth due to limited finances.  Rationing Lantus over past mth Medication side effects:  '[]'  Yes    '[x]'  No Home Monitoring?  '[]'  Yes    '[x]'  No Home glucose results range: Diet Adherence: '[x]'  Yes -not over eating     Exercise: '[x]'  Yes, does a lot of walking and lifting at work    Hypoglycemic episodes?: '[x]'  Yes - once at work and was able to treat appropriately Numbness of the feet? '[]'  Yes    '[x]'  No Retinopathy hx? '[]'  Yes    '[]'  No Last eye exam:  No insurnace Comments:   HTN:  Out of meds x 1 mth due to limited finances No CP/SOB/LE edema    Patient Active Problem List   Diagnosis Date Noted  . Anxiety state 05/30/2019  . Abnormal LFTs 05/30/2019  . Diabetic retinopathy of both eyes with macular edema associated with type 2 diabetes mellitus ([place]) 04/27/2018  . Type 2 diabetes mellitus with microalbuminuria, with long-term current use of insulin ([place]) 12/14/2017  . Depression with anxiety 12/14/2017  . Vitamin D  insufficiency 11/30/2016  . Epilepsy, generalized, convulsive ([place]) 12/27/2015  . Long-term use of high-risk medication 12/27/2015  . Microalbuminuric diabetic nephropathy ([place]) 11/15/2015  . Hyperlipidemia associated with type 2 diabetes mellitus ([place]) 11/15/2015  . Erectile dysfunction 11/13/2015  . Muscle cramps 11/13/2015  . Type 2 diabetes mellitus, uncontrolled ([place]) 09/06/2007  . OBESITY 09/06/2007  . HTN (hypertension) 09/06/2007  . Seizure disorder ([place]) 09/06/2007     Current Outpatient Medications on File Prior to Visit  Medication Sig Dispense Refill  . amLODipine (NORVASC) 10 MG tablet Take 1 tablet (10 mg total) by mouth daily. 90 tablet 3  . aspirin EC 81 MG tablet Take 1 tablet (81 mg total) by mouth daily. 100 tablet 1  . atorvastatin (LIPITOR) 40 MG tablet Take 1 tablet (40 mg total) by mouth daily. 30 tablet 6  . Blood Glucose Monitoring Suppl (ONE TOUCH ULTRA 2) w/Device KIT Use 3 times daily 1 each 0  . cholecalciferol (VITAMIN D-400) 10 MCG (400 UNIT) TABS tablet Take 1 tablet (400 Units total) by mouth daily. 100 tablet 0  . doxycycline (VIBRAMYCIN) 100 MG capsule Take 1 capsule (100 mg total) by mouth 2 (two) times daily. (Patient not taking: Reported  on 10/13/2019) 20 capsule 0  . gabapentin (NEURONTIN) 300 MG capsule Take 1 capsule (300 mg total) by mouth at bedtime. 30 capsule 11  . glucose blood test strip Use as instructed 3 times daily 100 each 2  . hydrochlorothiazide (HYDRODIURIL) 25 MG tablet Take 1 tablet (25 mg total) by mouth daily. 90 tablet 3  . Insulin Glargine (LANTUS SOLOSTAR) 100 UNIT/ML Solostar Pen Inject 54 Units into the skin at bedtime. 15 mL 5  . insulin lispro (HUMALOG KWIKPEN) 100 UNIT/ML KwikPen Inject 0.27 mLs (27 Units total) into the skin 3 (three) times daily. 15 mL 11  . Insulin Pen Needle (B-D UF III MINI PEN NEEDLES) 31G X 5 MM MISC USE AS DIRECTED 100 each 2  . levETIRAcetam (KEPPRA XR) 500 MG 24 hr tablet Take 1 tablet (500 mg  total) by mouth daily. 90 tablet 4  . lisinopril (PRINIVIL,ZESTRIL) 10 MG tablet Take 1 tablet (10 mg total) by mouth daily. 90 tablet 3  . Multiple Vitamin (MULTIVITAMIN WITH MINERALS) TABS tablet Take 1 tablet by mouth daily. Reported on 11/13/2015    . naproxen (NAPROSYN) 500 MG tablet     . omeprazole (PRILOSEC) 20 MG capsule TAKE 1 CAPSULE (20 MG TOTAL) BY MOUTH DAILY. 30 capsule 2  . ONETOUCH DELICA LANCETS 18H MISC 1 each by Does not apply route 3 (three) times daily. 100 each 2  . phenytoin (DILANTIN) 300 MG ER capsule Take 1 capsule (300 mg total) by mouth daily. 90 capsule 4  . promethazine (PHENERGAN) 12.5 MG tablet Take 1 tablet (12.5 mg total) by mouth daily as needed for nausea or vomiting. 30 tablet 0  . sildenafil (VIAGRA) 50 MG tablet Take 0.5-1 tablets (25-50 mg total) by mouth daily as needed for erectile dysfunction. 10 tablet 6  . sitaGLIPtin-metformin (JANUMET) 50-1000 MG tablet Take 1 tablet by mouth 2 (two) times daily with a meal. 180 tablet 0   No current facility-administered medications on file prior to visit.    No Known Allergies  Social History   Socioeconomic History  . Marital status: Legally Separated    Spouse name: Not on file  . Number of children: 3  . Years of education: Not on file  . Highest education level: Not on file  Occupational History  . Occupation: Arts development officer: [employer]  Tobacco Use  . Smoking status: Never Smoker  . Smokeless tobacco: Never Used  Substance and Sexual Activity  . Alcohol use: Yes    Alcohol/week: 0.0 standard drinks    Comment: occ  . Drug use: No  . Sexual activity: Not on file  Other Topics Concern  . Not on file  Social History Narrative  . Not on file   Social Determinants of Health   Financial Resource Strain:   . Difficulty of Paying Living Expenses: Not on file  Food Insecurity:   . Worried About Charity fundraiser in the Last Year: Not on file  . Ran Out of Food in the Last  Year: Not on file  Transportation Needs:   . Lack of Transportation (Medical): Not on file  . Lack of Transportation (Non-Medical): Not on file  Physical Activity:   . Days of Exercise per Week: Not on file  . Minutes of Exercise per Session: Not on file  Stress:   . Feeling of Stress : Not on file  Social Connections:   . Frequency of Communication with Friends and Family: Not on file  .  Frequency of Social Gatherings with Friends and Family: Not on file  . Attends Religious Services: Not on file  . Active Member of Clubs or Organizations: Not on file  . Attends Archivist Meetings: Not on file  . Marital Status: Not on file  Intimate Partner Violence:   . Fear of Current or Ex-Partner: Not on file  . Emotionally Abused: Not on file  . Physically Abused: Not on file  . Sexually Abused: Not on file    Family History  Problem Relation Age of Onset  . Hypertension Mother   . Diabetes Mother   . Lupus Mother   . Kidney failure Mother   . Diabetes Father   . Hypertension Father   . Kidney failure Father     Past Surgical History:  Procedure Laterality Date  . NO PAST SURGERIES    . REFRACTIVE SURGERY Bilateral 04/2018    ROS: Review of Systems Negative except as stated above  PHYSICAL EXAM: BP (!) 154/99   Pulse 87   Resp 16   Wt 266 lb (120.7 kg)   SpO2 99%   BMI 40.45 kg/m   Physical Exam General appearance - alert, well appearing, and in no distress Mental status - normal mood, behavior, speech, dress, motor activity, and thought processes Chest - clear to auscultation, no wheezes, rales or rhonchi, symmetric air entry Heart - normal rate, regular rhythm, normal S1, S2, no murmurs, rubs, clicks or gallops Extremities - peripheral pulses normal, no pedal edema, no clubbing or cyanosis Diabetic Foot Exam - Simple   Simple Foot Form Visual Inspection See comments: Yes Sensation Testing See comments: Yes Pulse Check Posterior Tibialis and Dorsalis  pulse intact bilaterally: Yes Comments Patient with large ulcerative calluses on the dorsal surface of the right second toe.  No drainage expressed.  No surrounding erythema.  Patient has significant decreased sensation over this callus.  It cannot be probed to the bone.  He has decreased sensation on leap exam of the toes on both feet.  Dorsalis pedis pulses and posterior tibialis pulses are strong.         CMP Latest Ref Rng & Units 05/30/2019 02/01/2019 02/24/2018  Glucose 65 - 99 mg/dL - 108(H) -  BUN 6 - 24 mg/dL - 17 -  Creatinine 0.76 - 1.27 mg/dL - 0.74(L) -  Sodium 134 - 144 mmol/L - 139 -  Potassium 3.5 - 5.2 mmol/L - 4.3 3.9  Chloride 96 - 106 mmol/L - 100 -  CO2 20 - 29 mmol/L - 24 -  Calcium 8.7 - 10.2 mg/dL - 9.9 -  Total Protein 6.0 - 8.5 g/dL 7.8 7.5 -  Total Bilirubin 0.0 - 1.2 mg/dL 0.2 0.3 -  Alkaline Phos 39 - 117 IU/L 65 56 -  AST 0 - 40 IU/L 56(H) 43(H) -  ALT 0 - 44 IU/L 67(H) 64(H) -   Lipid Panel     Component Value Date/Time   CHOL 183 02/01/2019 1053   TRIG 243 (H) 02/01/2019 1053   HDL 35 (L) 02/01/2019 1053   CHOLHDL 5.2 (H) 02/01/2019 1053   CHOLHDL 5.1 (H) 11/28/2016 1232   VLDL 70 (H) 11/28/2016 1232   LDLCALC 99 02/01/2019 1053    CBC    Component Value Date/Time   WBC 5.1 02/01/2019 1053   WBC 4.4 12/06/2017 2310   RBC 4.22 02/01/2019 1053   RBC 4.49 12/06/2017 2310   HGB 13.7 02/01/2019 1053   HCT 39.0 02/01/2019 1053  PLT 258 02/01/2019 1053   MCV 92 02/01/2019 1053   MCH 32.5 02/01/2019 1053   MCH 32.7 12/06/2017 2310   MCHC 35.1 02/01/2019 1053   MCHC 36.4 (H) 12/06/2017 2310   RDW 12.2 02/01/2019 1053   LYMPHSABS 2.5 12/06/2017 2310   MONOABS 0.4 12/06/2017 2310   EOSABS 0.1 12/06/2017 2310   BASOSABS 0.0 12/06/2017 2310    ASSESSMENT AND PLAN: 1. Callous ulcer, limited to breakdown of skin ([place]) Advised patient to renew his orange card/cone discount card ASAP so that we can refer him to the podiatrist.  We will get  another x-ray of the foot to see if there is any changes in the bones to suggest osteomyelitis.  No need for antibiotics at this time.  Recommend applying several 2 x 2 gauze over this toe when he has to wear his steel toe boots at work to prevent friction/rubbing and creating pressure on this area - DG Foot Complete Right; Future - Ambulatory referral to Podiatry - Sedimentation Rate  2. Uncontrolled type 2 diabetes mellitus with complication, with long-term current use of insulin ([place]) Uncontrolled with A1c increasing by three-point since last visit.  Due to patient being off medications for 1 month because of limited finances.  He is now working again and plans to get his medications today.  Encouraged him to check blood sugars daily.  Dietary counseling given. - Glucose (CBG) - HgB A1c  3. Essential hypertension Not at goal.  Plans to get medicines today.  4. Abnormal LFTs Patient on seizure medicine and also on statin.  He has had mild elevation in AST and ALT.  We plan to recheck LFTs today - Hepatic function panel    Patient was given the opportunity to ask questions.  Patient verbalized understanding of the plan and was able to repeat key elements of the plan.   Orders Placed This Encounter  Procedures  . DG Foot Complete Right  . Sedimentation Rate  . Hepatic function panel  . Ambulatory referral to Podiatry  . Glucose (CBG)  . HgB A1c     Requested Prescriptions    No prescriptions requested or ordered in this encounter    Return in about 1 month (around 11/13/2019).  Karle Plumber, MD, FACP

## 2019-10-14 ENCOUNTER — Other Ambulatory Visit: Payer: Self-pay | Admitting: Internal Medicine

## 2019-10-14 LAB — HEPATIC FUNCTION PANEL
ALT: 88 IU/L — ABNORMAL HIGH (ref 0–44)
AST: 75 IU/L — ABNORMAL HIGH (ref 0–40)
Albumin: 4.1 g/dL (ref 4.0–5.0)
Alkaline Phosphatase: 75 IU/L (ref 39–117)
Bilirubin Total: 0.6 mg/dL (ref 0.0–1.2)
Bilirubin, Direct: 0.19 mg/dL (ref 0.00–0.40)
Total Protein: 7.5 g/dL (ref 6.0–8.5)

## 2019-10-14 LAB — SEDIMENTATION RATE: Sed Rate: 11 mm/hr (ref 0–15)

## 2019-11-28 ENCOUNTER — Encounter: Payer: Self-pay | Admitting: Internal Medicine

## 2019-11-28 MED FILL — AMLODIPINE BESYLATE 10 MG T: 10 | 30 days supply | Qty: 30 | Fill #6

## 2019-11-28 MED FILL — !JANUMET 50-1000MG TABLET: 50-1000 | 30 days supply | Qty: 60 | Fill #7

## 2019-11-28 MED FILL — ?HYDROCHLOROTHIAZIDE 25MG T: 25 | 30 days supply | Qty: 30 | Fill #6

## 2019-11-28 MED FILL — ?ATORVASTATIN 40MG TABL: 40 | 30 days supply | Qty: 30 | Fill #4

## 2019-11-28 MED FILL — LISINOPRIL 10 MG TABS: 10 | 30 days supply | Qty: 30 | Fill #7

## 2019-11-28 MED FILL — GABAPENTIN 300 MG CAPSULE: 300 | 30 days supply | Qty: 30 | Fill #7

## 2019-11-28 MED FILL — ?OMEPRAZOLE 20MG CAP DR: 20 | 30 days supply | Qty: 30 | Fill #2

## 2019-11-28 MED FILL — ?HUMALOG 100 UNITS/ML KWIKP: 100 | 29 days supply | Qty: 24 | Fill #3

## 2019-11-28 MED FILL — LEVETIRACETAM ER 500 MG TAB: 500 | 30 days supply | Qty: 30 | Fill #2

## 2019-11-28 MED FILL — ?BASAGLAR 100 UNITS/ML KWPE: 100 | 27 days supply | Qty: 15 | Fill #3

## 2020-01-02 ENCOUNTER — Encounter: Payer: Self-pay | Admitting: Internal Medicine

## 2020-01-03 MED FILL — AMLODIPINE BESYLATE 10 MG T: 10 | 30 days supply | Qty: 30 | Fill #7

## 2020-01-03 MED FILL — LEVETIRACETAM ER 500 MG TAB: 500 | 30 days supply | Qty: 30 | Fill #3

## 2020-01-03 MED FILL — ?ATORVASTATIN 40MG TABL: 40 | 30 days supply | Qty: 30 | Fill #5

## 2020-01-03 MED FILL — LISINOPRIL 10 MG TABS: 10 | 30 days supply | Qty: 30 | Fill #8

## 2020-01-03 MED FILL — GABAPENTIN 300 MG CAPSULE: 300 | 30 days supply | Qty: 30 | Fill #8

## 2020-01-03 MED FILL — ?HYDROCHLOROTHIAZIDE 25MG T: 25 | 30 days supply | Qty: 30 | Fill #7

## 2020-01-03 MED FILL — ?BASAGLAR 100 UNITS/ML KWPE: 100 | 27 days supply | Qty: 15 | Fill #4

## 2020-01-03 MED FILL — ?HUMALOG 100 UNITS/ML KWIKP: 100 | 29 days supply | Qty: 24 | Fill #4

## 2020-01-03 MED FILL — JANUMET 50-1,000 MG TABLET: 50-1000 | 30 days supply | Qty: 60 | Fill #8

## 2020-01-11 ENCOUNTER — Encounter: Payer: Self-pay | Admitting: Internal Medicine

## 2020-01-13 ENCOUNTER — Ambulatory Visit: Payer: Self-pay | Attending: Internal Medicine

## 2020-01-13 DIAGNOSIS — Z23 Encounter for immunization: Secondary | ICD-10-CM

## 2020-01-13 NOTE — Progress Notes (Signed)
   Covid-19 Vaccination Clinic  Name:  Albert Garcia    MRN: [id number] DOB: [birth year]  01/13/2020  Mr. Albert Garcia was observed post Covid-19 immunization for 15 minutes without incident. He was provided with Vaccine Information Sheet and instruction to access the V-Safe system.   Mr. Albert Garcia was instructed to call 911 with any severe reactions post vaccine: Albert Garcia Kitchen Difficulty breathing  . Swelling of face and throat  . A fast heartbeat  . A bad rash all over body  . Dizziness and weakness   Immunizations Administered    Name Date Dose VIS Date Route   Pfizer COVID-19 Vaccine 01/13/2020  9:52 AM 0.3 mL 10/07/2019 Intramuscular   Manufacturer: [employer], [employer]   Lot: [id number]   NDC: [address]-6

## 2020-01-23 ENCOUNTER — Encounter: Payer: Self-pay | Admitting: Internal Medicine

## 2020-02-04 ENCOUNTER — Encounter: Payer: Self-pay | Admitting: Internal Medicine

## 2020-02-05 ENCOUNTER — Other Ambulatory Visit: Payer: Self-pay | Admitting: Internal Medicine

## 2020-02-05 DIAGNOSIS — I10 Essential (primary) hypertension: Secondary | ICD-10-CM

## 2020-02-05 DIAGNOSIS — E1142 Type 2 diabetes mellitus with diabetic polyneuropathy: Secondary | ICD-10-CM

## 2020-02-05 MED ORDER — HYDROCHLOROTHIAZIDE 25 MG PO TABS: 25.0000 mg | ORAL_TABLET | Freq: Every day | ORAL | 3 refills | Status: DC

## 2020-02-05 MED ORDER — LISINOPRIL 10 MG PO TABS: 10.0000 mg | ORAL_TABLET | Freq: Every day | ORAL | 3 refills | Status: DC

## 2020-02-05 MED ORDER — AMLODIPINE BESYLATE 10 MG PO TABS: 10.0000 mg | ORAL_TABLET | Freq: Every day | ORAL | 3 refills | Status: DC

## 2020-02-05 MED ORDER — GABAPENTIN 300 MG PO CAPS: 300.0000 mg | ORAL_CAPSULE | Freq: Every day | ORAL | 11 refills | Status: DC

## 2020-02-05 MED ORDER — JANUMET 50-1000 MG PO TABS: 1.0000 | ORAL_TABLET | Freq: Two times a day (BID) | ORAL | 3 refills | Status: DC

## 2020-02-06 MED FILL — LISINOPRIL 10 MG TABS: 10 | 90 days supply | Qty: 90 | Fill #0

## 2020-02-06 MED FILL — JANUMET 50-1,000 MG TABLET: 50-1000 | 30 days supply | Qty: 60 | Fill #0

## 2020-02-06 MED FILL — GABAPENTIN 300 MG CAPSULE: 300 | 30 days supply | Qty: 30 | Fill #0

## 2020-02-06 MED FILL — ?HYDROCHLOROTHIAZIDE 25MG T: 25 | 30 days supply | Qty: 30 | Fill #0

## 2020-02-07 ENCOUNTER — Ambulatory Visit: Payer: Self-pay | Attending: Internal Medicine

## 2020-02-07 ENCOUNTER — Encounter: Payer: Self-pay | Admitting: Internal Medicine

## 2020-02-07 DIAGNOSIS — Z23 Encounter for immunization: Secondary | ICD-10-CM

## 2020-02-07 NOTE — Progress Notes (Signed)
   Covid-19 Vaccination Clinic  Name:  DESHUN SEDIVY    MRN: [id number] DOB: May 20, 1971  02/07/2020  Mr. Simonian was observed post Covid-19 immunization for 15 minutes without incident. He was provided with Vaccine Information Sheet and instruction to access the V-Safe system.   Mr. Doty was instructed to call 911 with any severe reactions post vaccine: Marland Kitchen Difficulty breathing  . Swelling of face and throat  . A fast heartbeat  . A bad rash all over body  . Dizziness and weakness   Immunizations Administered    Name Date Dose VIS Date Route   Pfizer COVID-19 Vaccine 02/07/2020 10:45 AM 0.3 mL 10/07/2019 Intramuscular   Manufacturer: [employer], [employer]   Lot: [id number]   NDC: [address]-5

## 2020-02-09 ENCOUNTER — Other Ambulatory Visit: Payer: Self-pay | Admitting: Pharmacist

## 2020-02-09 DIAGNOSIS — E1165 Type 2 diabetes mellitus with hyperglycemia: Secondary | ICD-10-CM

## 2020-02-09 DIAGNOSIS — IMO0002 Reserved for concepts with insufficient information to code with codable children: Secondary | ICD-10-CM

## 2020-02-09 MED ORDER — BD PEN NEEDLE MINI U/F 31G X 5 MM MISC: 2 refills | Status: DC

## 2020-02-09 MED FILL — TRUEPLUS 5-BEVEL PEN NEEDLE: 31G X 5 MM | 25 days supply | Qty: 100 | Fill #0

## 2020-03-06 ENCOUNTER — Encounter: Payer: Self-pay | Admitting: Internal Medicine

## 2020-03-14 ENCOUNTER — Ambulatory Visit: Payer: Self-pay

## 2020-03-14 ENCOUNTER — Other Ambulatory Visit: Payer: Self-pay | Admitting: Occupational Medicine

## 2020-03-14 ENCOUNTER — Other Ambulatory Visit: Payer: Self-pay

## 2020-03-14 DIAGNOSIS — M25512 Pain in left shoulder: Secondary | ICD-10-CM

## 2020-03-16 MED FILL — NOVOLOG FLEXPEN SYRINGE: 100 | 29 days supply | Qty: 24 | Fill #0

## 2020-03-16 MED FILL — LEVETIRACETAM ER 500 MG TAB: 500 | 30 days supply | Qty: 30 | Fill #4

## 2020-03-16 MED FILL — BD PEN NDL MINI 31GX5MM: 31G X 5 MM | 25 days supply | Qty: 100 | Fill #0

## 2020-03-16 MED FILL — GABAPENTIN 300 MG CAPSULE: 300 | 30 days supply | Qty: 30 | Fill #1

## 2020-03-16 MED FILL — ATORVASTATIN CALCIUM 40 MG: 40 | 30 days supply | Qty: 30 | Fill #6

## 2020-03-16 MED FILL — JANUMET 50-1,000 MG TABLET: 50-1000 | 30 days supply | Qty: 60 | Fill #1

## 2020-03-16 MED FILL — BASAGLAR 100 UNIT/ML KWIKPE: 100 | 27 days supply | Qty: 15 | Fill #5

## 2020-03-28 ENCOUNTER — Encounter: Payer: Self-pay | Admitting: Internal Medicine

## 2020-04-05 ENCOUNTER — Encounter: Payer: Self-pay | Admitting: Internal Medicine

## 2020-04-10 IMAGING — DX DG ORBITS FOR FOREIGN BODY
2 series · 2 of 2 positions shown · non-contrast
Comparison: None.

CLINICAL DATA: Metal working/exposure; clearance prior to MRI

EXAM:
ORBITS FOR FOREIGN BODY - 2 VIEW

[dg eye foreign body (1 of 2)]
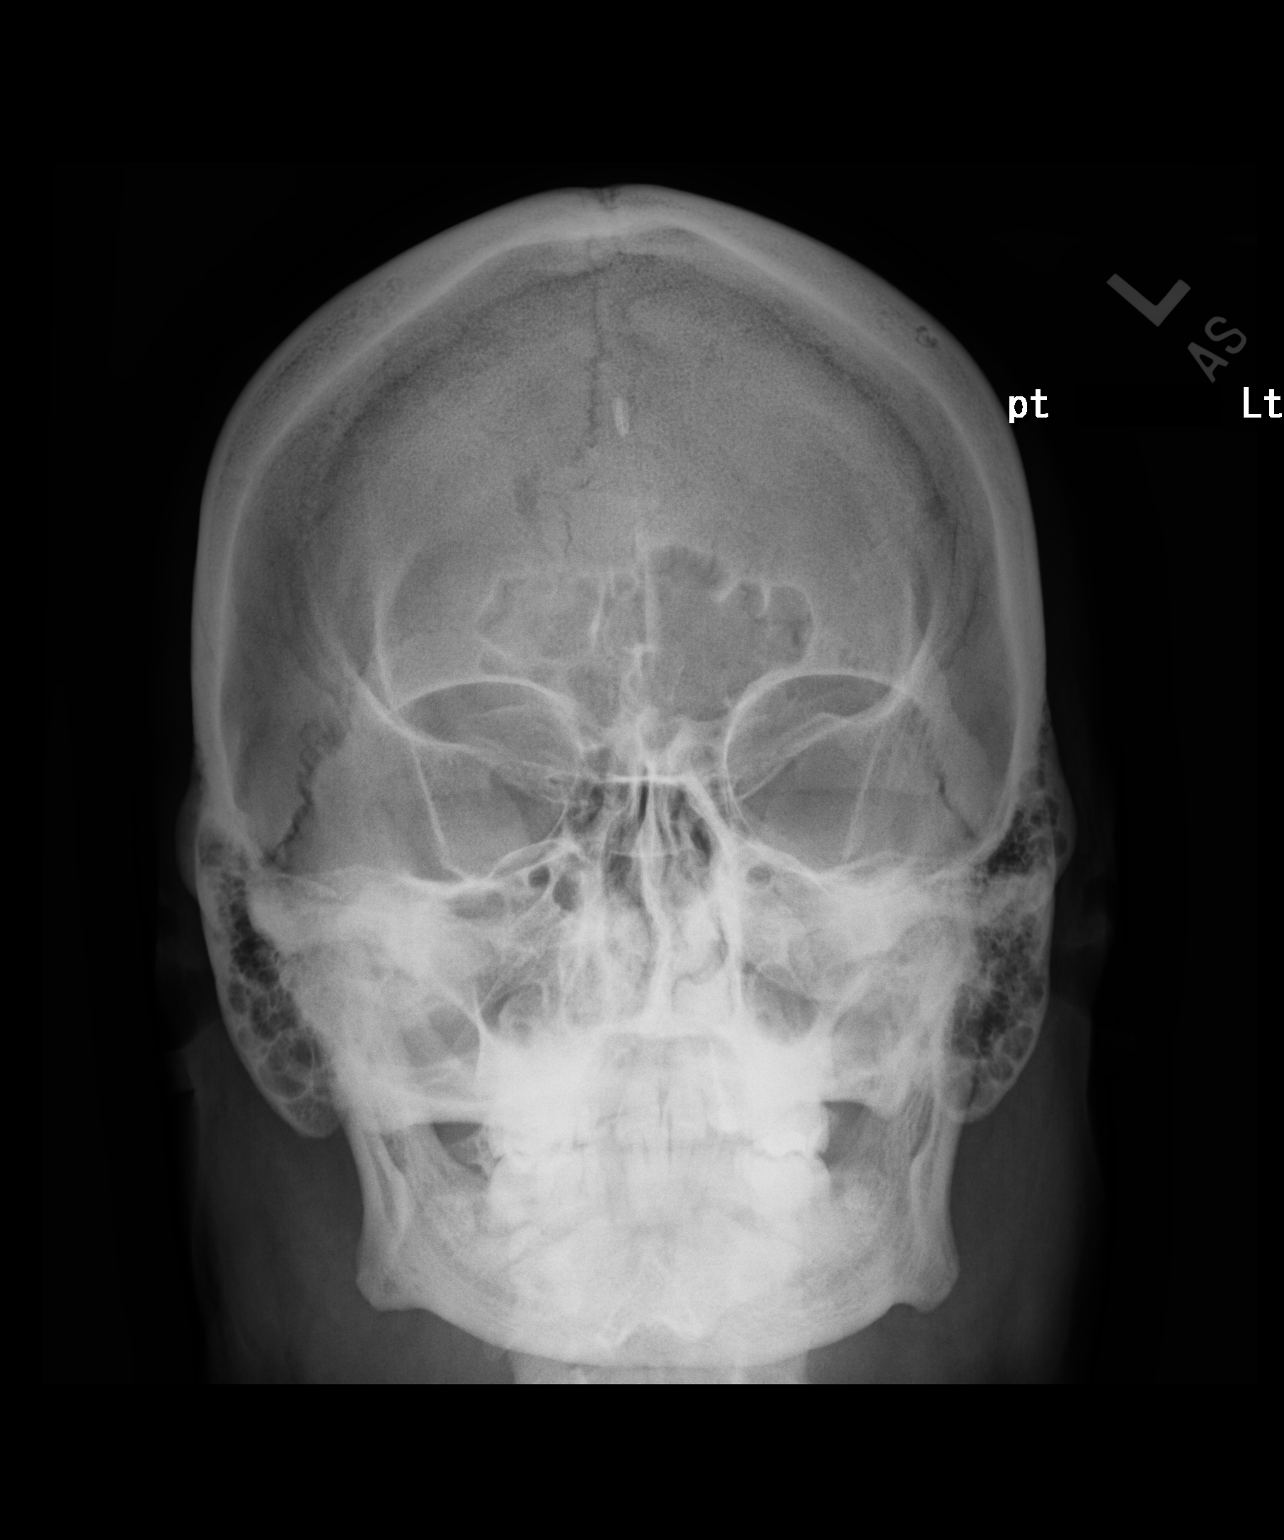

[dg eye foreign body (2 of 2)]
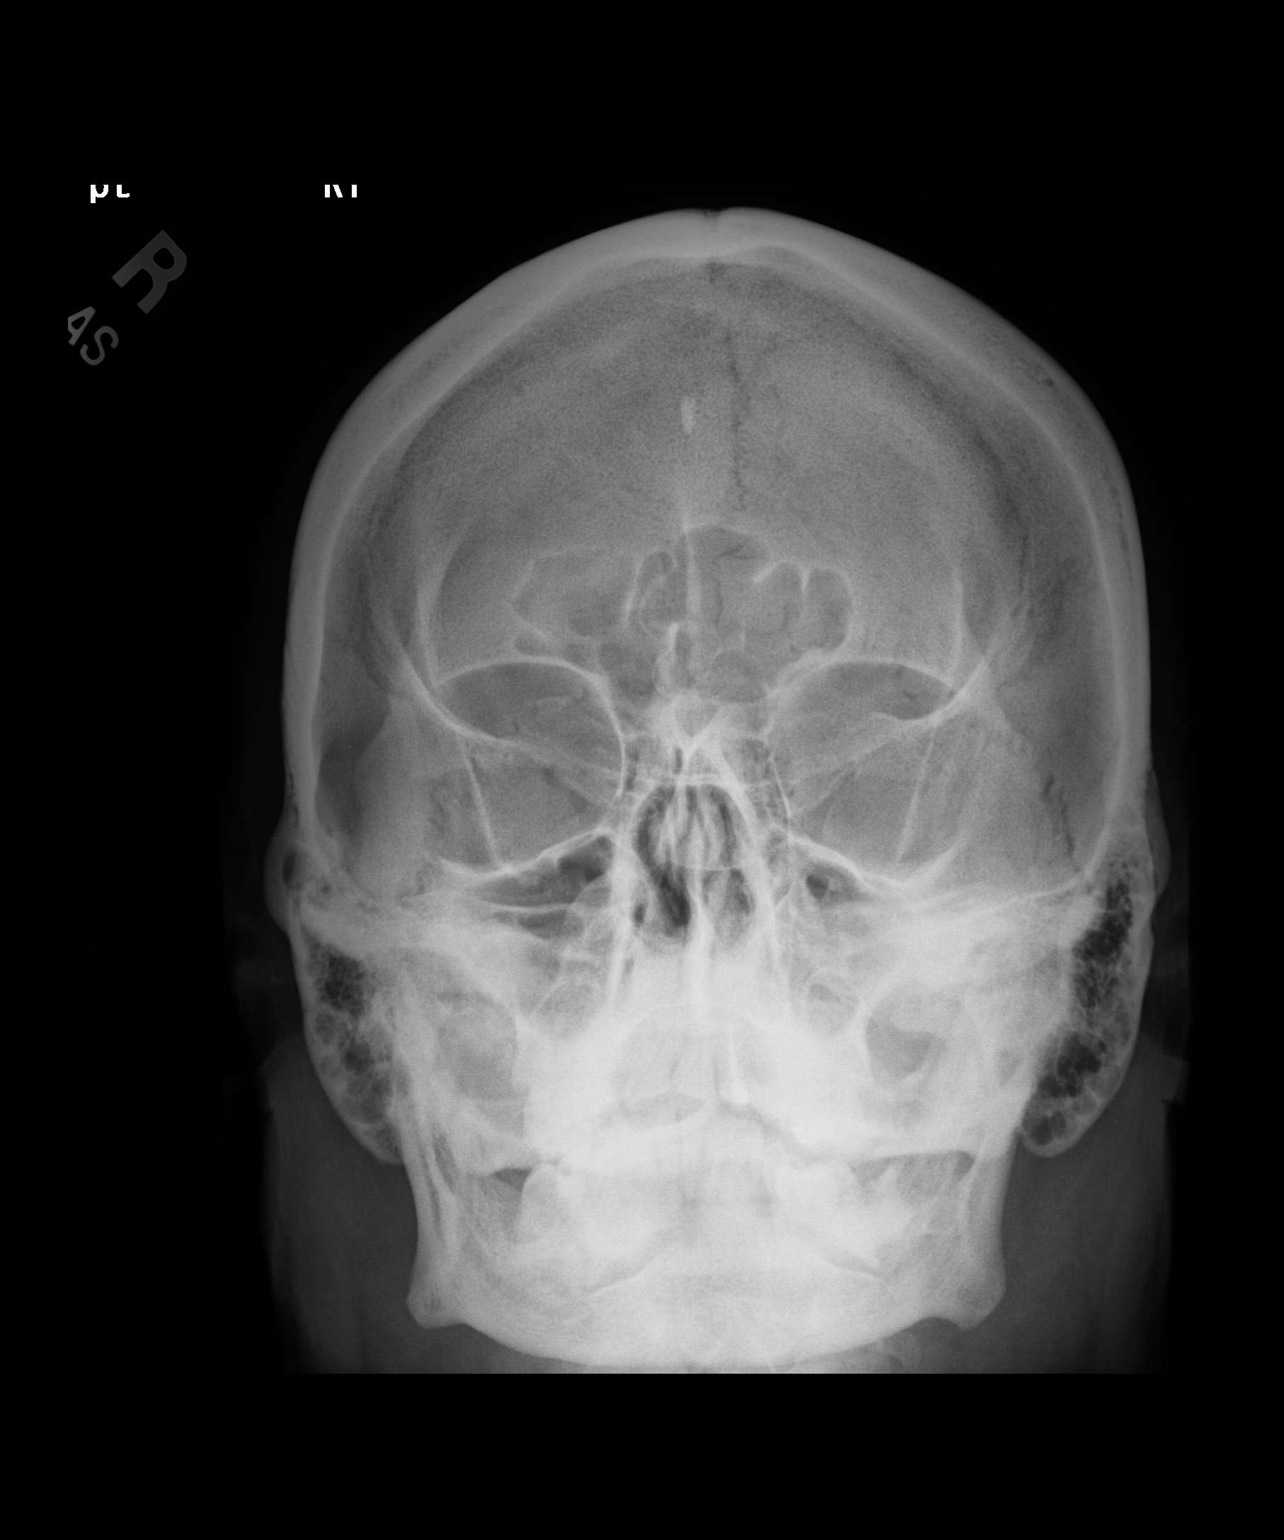

[2 of 2 positions shown; findings below may reference images not displayed]

FINDINGS: Water's views with eyes deviated toward the left and toward the
right were obtained. No intraorbital radiopaque foreign body. There
is hazy opacity in the right frontal sinus region. Other paranasal
sinuses are clear. No fracture or dislocation.
IMPRESSION: No evidence of metallic foreign body within the orbits.
Opacification consistent with a degree of sinusitis in the right
frontal sinus region.

## 2020-04-16 ENCOUNTER — Encounter: Payer: Self-pay | Admitting: Internal Medicine

## 2020-04-16 ENCOUNTER — Other Ambulatory Visit: Payer: Self-pay

## 2020-04-16 ENCOUNTER — Other Ambulatory Visit: Payer: Self-pay | Admitting: Internal Medicine

## 2020-04-16 ENCOUNTER — Ambulatory Visit: Payer: BC Managed Care – PPO | Attending: Internal Medicine | Admitting: Licensed Clinical Social Worker

## 2020-04-16 ENCOUNTER — Ambulatory Visit: Payer: BC Managed Care – PPO | Attending: Internal Medicine | Admitting: Internal Medicine

## 2020-04-16 VITALS — BP 152/93 | HR 94 | Temp 98.1°F | Resp 16 | Ht 68.0 in | Wt 263.0 lb

## 2020-04-16 DIAGNOSIS — G40909 Epilepsy, unspecified, not intractable, without status epilepticus: Secondary | ICD-10-CM

## 2020-04-16 DIAGNOSIS — I10 Essential (primary) hypertension: Secondary | ICD-10-CM | POA: Diagnosis not present

## 2020-04-16 DIAGNOSIS — Z794 Long term (current) use of insulin: Secondary | ICD-10-CM | POA: Diagnosis not present

## 2020-04-16 DIAGNOSIS — R945 Abnormal results of liver function studies: Secondary | ICD-10-CM

## 2020-04-16 DIAGNOSIS — E11311 Type 2 diabetes mellitus with unspecified diabetic retinopathy with macular edema: Secondary | ICD-10-CM | POA: Diagnosis not present

## 2020-04-16 DIAGNOSIS — E1142 Type 2 diabetes mellitus with diabetic polyneuropathy: Secondary | ICD-10-CM | POA: Diagnosis not present

## 2020-04-16 DIAGNOSIS — R7989 Other specified abnormal findings of blood chemistry: Secondary | ICD-10-CM

## 2020-04-16 DIAGNOSIS — R0683 Snoring: Secondary | ICD-10-CM

## 2020-04-16 DIAGNOSIS — F439 Reaction to severe stress, unspecified: Secondary | ICD-10-CM

## 2020-04-16 DIAGNOSIS — S91209A Unspecified open wound of unspecified toe(s) with damage to nail, initial encounter: Secondary | ICD-10-CM

## 2020-04-16 LAB — POCT GLYCOSYLATED HEMOGLOBIN (HGB A1C): HbA1c, POC (controlled diabetic range): 11.6 % — AB (ref 0.0–7.0)

## 2020-04-16 LAB — GLUCOSE, POCT (MANUAL RESULT ENTRY): POC Glucose: 392 mg/dl — AB (ref 70–99)

## 2020-04-16 MED ORDER — AMLODIPINE BESYLATE 10 MG PO TABS: 10.0000 mg | ORAL_TABLET | Freq: Every day | ORAL | 3 refills | Status: DC

## 2020-04-16 MED ORDER — LANTUS SOLOSTAR 100 UNIT/ML ~~LOC~~ SOPN: 54.0000 [IU] | PEN_INJECTOR | Freq: Every day | SUBCUTANEOUS | 5 refills | Status: DC

## 2020-04-16 MED ORDER — GABAPENTIN 300 MG PO CAPS: 300.0000 mg | ORAL_CAPSULE | Freq: Every day | ORAL | 11 refills | Status: DC

## 2020-04-16 MED ORDER — INSULIN LISPRO (1 UNIT DIAL) 100 UNIT/ML (KWIKPEN): 27.0000 [IU] | PEN_INJECTOR | Freq: Three times a day (TID) | SUBCUTANEOUS | 11 refills | Status: DC

## 2020-04-16 MED ORDER — LISINOPRIL 10 MG PO TABS: 10.0000 mg | ORAL_TABLET | Freq: Every day | ORAL | 3 refills | Status: DC

## 2020-04-16 MED ORDER — LEVETIRACETAM ER 500 MG PO TB24: 500.0000 mg | ORAL_TABLET | Freq: Every day | ORAL | 4 refills | Status: DC

## 2020-04-16 MED ORDER — JANUMET 50-1000 MG PO TABS: 1.0000 | ORAL_TABLET | Freq: Two times a day (BID) | ORAL | 3 refills | Status: DC

## 2020-04-16 MED FILL — BASAGLAR 100 UNIT/ML KWIKPE: 100 | 27 days supply | Qty: 15 | Fill #0

## 2020-04-16 MED FILL — NOVOLOG FLEXPEN SYRINGE: 100 | 18 days supply | Qty: 15 | Fill #0

## 2020-04-16 NOTE — Progress Notes (Signed)
Patient ID: Albert Garcia, male    DOB: July 02, 1971  MRN: [id number]  CC: Diabetes, Hypertension, and Referral   Subjective: Albert Garcia is a 49 y.o. male who presents for chronic ds management His concerns today include:  Pt with hx of DMwith BL retinopathy,neuropathy and microalbuminuria, HTN, HL, Sz, dep/anx, ED, GERD  RT foot:  Ulcer healed on RT 2 nd toe.  Did not see podiatry. Today he reports that he pulled off the toenail on his right big toe yesterday because it was overgrown and getting in the way when he puts on his shoes.  He states it was halfway off anyway.  Since pulling it off he has been applying peroxide.  Seen at [facility] last Friday for swelling in hands.  States he was told it was an allergic reaction.  It has resolved.  DIABETES TYPE 2 Last A1C:   Results for orders placed or performed in visit on 04/16/20  POCT glucose (manual entry)  Result Value Ref Range   POC Glucose 392 (A) 70 - 99 mg/dl  POCT glycosylated hemoglobin (Hb A1C)  Result Value Ref Range   Hemoglobin A1C     HbA1c POC (<> result, manual entry)     HbA1c, POC (prediabetic range)     HbA1c, POC (controlled diabetic range) 11.6 (A) 0.0 - 7.0 %  Just got new insurance Med Adherence:  [] Yes    [x] No -not able to afford meds despite insurance.  Out of Janumet and Humalog for 1.5 mths.  Taking Lantus 54 units daily but did not take it last night Medication side effects:  [] Yes    [] No Home Monitoring?  [] Yes    [x] No - loss meter.  Just got a new one Home glucose results range: Diet Adherence: [x] Yes  -water and fruits, drinks veggie smoothie Exercise: [x] Yes -does a lot of walking and standing on his job Hypoglycemic episodes?: [] Yes    [x] No Numbness of the feet? [] Yes    [x] No Retinopathy hx? [x] Yes    [] No Last eye exam: Overdue Comments:   HYPERTENSION Currently taking: see medication list Med Adherence: [x] Yes  -but did not take as yet today.  He stopped HCTZ 2  mths ago because it was causing ED.  Taking Norvasc and Lisinopril Medication side effects: [x] Yes -he feels hydrochlorothiazide causes erectile dysfunction Adherence with salt restriction: [x] Yes    [] No Home Monitoring?: [] Yes    [x] No Monitoring Frequency: [] Yes    [] No Home BP results range: [] Yes    [] No SOB? [] Yes    [x] No Chest Pain?: [] Yes    [x] No Leg swelling?: [x] Yes when stationary    [] No Headaches?: [] Yes    [x] No Dizziness? [] Yes    [x] No Comments:     SZ:  No sz since last visit.  He thinks his last seizure was about 2 years ago.  Out of Keprra since 09/2019 even though last prescription was written as a 90-day supply with 4 refills.  States he was not aware of the refills.  Off Dilantin for 2-3 mths.  He works around Research scientist (life sciences).  Abnormal LFTs persisted when last checked in December of last year.  We had him hold off on taking Lipitor because of it.  Request referral for sleep study.  Thinks  he may have sleep apnea.  His significant other tells him that he snores loud and sometimes stops breathing in his sleep.  Also reports feeling sleepy during the day and waking up feeling unrefreshed. Patient Active Problem List   Diagnosis Date Noted  . Callous ulcer, limited to breakdown of skin ([place]) 10/13/2019  . Anxiety state 05/30/2019  . Abnormal LFTs 05/30/2019  . 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 Type 2 diabetes mellitus with microalbuminuria, with long-term current use of insulin ([place]) 12/14/2017  . Depression with anxiety 12/14/2017  . Vitamin D insufficiency 11/30/2016  . Epilepsy, generalized, convulsive ([place]) 12/27/2015  . Long-term use of high-risk medication 12/27/2015  . Microalbuminuric diabetic nephropathy ([place]) 11/15/2015  . Hyperlipidemia associated with type 2 diabetes mellitus ([place]) 11/15/2015  . Erectile dysfunction 11/13/2015  . Muscle cramps  11/13/2015  . Type 2 diabetes mellitus, uncontrolled ([place]) 09/06/2007  . OBESITY 09/06/2007  . HTN (hypertension) 09/06/2007  . Seizure disorder ([place]) 09/06/2007     Current Outpatient Medications on File Prior to Visit  Medication Sig Dispense Refill  . aspirin EC 81 MG tablet Take 1 tablet (81 mg total) by mouth daily. 100 tablet 1  . Blood Glucose Monitoring Suppl (ONE TOUCH ULTRA 2) w/Device KIT Use 3 times daily 1 each 0  . cholecalciferol (VITAMIN D-400) 10 MCG (400 UNIT) TABS tablet Take 1 tablet (400 Units total) by mouth daily. 100 tablet 0  . glucose blood test strip Use as instructed 3 times daily 100 each 2  . Insulin Pen Needle (B-D UF III MINI PEN NEEDLES) 31G X 5 MM MISC USE AS DIRECTED 100 each 2  . Multiple Vitamin (MULTIVITAMIN WITH MINERALS) TABS tablet Take 1 tablet by mouth daily. Reported on 11/13/2015    . naproxen (NAPROSYN) 500 MG tablet     . omeprazole (PRILOSEC) 20 MG capsule TAKE 1 CAPSULE (20 MG TOTAL) BY MOUTH DAILY. 30 capsule 2  . ONETOUCH DELICA LANCETS 32K MISC 1 each by Does not apply route 3 (three) times daily. 100 each 2  . promethazine (PHENERGAN) 12.5 MG tablet Take 1 tablet (12.5 mg total) by mouth daily as needed for nausea or vomiting. 30 tablet 0  . sildenafil (VIAGRA) 50 MG tablet Take 0.5-1 tablets (25-50 mg total) by mouth daily as needed for erectile dysfunction. 10 tablet 6   No current facility-administered medications on file prior to visit.    Allergies  Allergen Reactions  . Hctz [Hydrochlorothiazide]     Causes ED     Social History   Socioeconomic History  . Marital status: Legally Separated    Spouse name: Not on file  . Number of children: 3  . Years of education: Not on file  . Highest education level: Not on file  Occupational History  . Occupation: Arts development officer: [employer]  Tobacco Use  . Smoking status: Never Smoker  . Smokeless tobacco: Never Used  Vaping Use  . Vaping Use: Never used    Substance and Sexual Activity  . Alcohol use: Yes    Alcohol/week: 0.0 standard drinks    Comment: occ  . Drug use: No  . Sexual activity: Not on file  Other Topics Concern  . Not on file  Social History Narrative  . Not on file   Social Determinants of Health   Financial Resource Strain:   . Difficulty of Paying Living Expenses:   Food  Insecurity:   . Worried About Charity fundraiser in the Last Year:   . Arboriculturist in the Last Year:   Transportation Needs:   . Film/video editor (Medical):   Marland Kitchen Lack of Transportation (Non-Medical):   Physical Activity:   . Days of Exercise per Week:   . Minutes of Exercise per Session:   Stress:   . Feeling of Stress :   Social Connections:   . Frequency of Communication with Friends and Family:   . Frequency of Social Gatherings with Friends and Family:   . Attends Religious Services:   . Active Member of Clubs or Organizations:   . Attends Archivist Meetings:   Marland Kitchen Marital Status:   Intimate Partner Violence:   . Fear of Current or Ex-Partner:   . Emotionally Abused:   Marland Kitchen Physically Abused:   . Sexually Abused:     Family History  Problem Relation Age of Onset  . Hypertension Mother   . Diabetes Mother   . Lupus Mother   . Kidney failure Mother   . Diabetes Father   . Hypertension Father   . Kidney failure Father     Past Surgical History:  Procedure Laterality Date  . NO PAST SURGERIES    . REFRACTIVE SURGERY Bilateral 04/2018    ROS: Review of Systems Negative except as stated above  PHYSICAL EXAM: BP (!) 152/93   Pulse 94   Temp 98.1 F (36.7 C)   Resp 16   Ht 5' 8" (1.727 m)   Wt 263 lb (119.3 kg)   SpO2 97%   BMI 39.99 kg/m   Physical Exam  General appearance - alert, well appearing, and in no distress Mental status - normal mood, behavior, speech, dress, motor activity, and thought processes Eyes - pupils equal and reactive, extraocular eye movements intact Chest - clear to  auscultation, no wheezes, rales or rhonchi, symmetric air entry Heart - normal rate, regular rhythm, normal S1, S2, no murmurs, rubs, clicks or gallops Extremities - peripheral pulses normal, no pedal edema, no clubbing or cyanosis Diabetic Foot Exam - Simple   Simple Foot Form Visual Inspection See comments: Yes Sensation Testing Intact to touch and monofilament testing bilaterally: Yes Pulse Check Posterior Tibialis and Dorsalis pulse intact bilaterally: Yes Comments Also on the right second toe has healed.  There is absence of toenail on the right big toe.  There is some clear drainage when the area around the nailbed is pressed.  There is no erythema or edema.     Depression screen [facility] 2/9 04/16/2020 06/03/2019 05/30/2019  Decreased Interest 0 1 1  Down, Depressed, Hopeless 0 0 0  PHQ - 2 Score 0 1 1  Altered sleeping - 3 -  Tired, decreased energy - 2 -  Change in appetite - 3 -  Feeling bad or failure about yourself  - 2 -  Trouble concentrating - 0 -  Moving slowly or fidgety/restless - 0 -  Suicidal thoughts - 0 -  PHQ-9 Score - 11 -    CMP Latest Ref Rng & Units 10/13/2019 05/30/2019 02/01/2019  Glucose 65 - 99 mg/dL - - 108(H)  BUN 6 - 24 mg/dL - - 17  Creatinine 0.76 - 1.27 mg/dL - - 0.74(L)  Sodium 134 - 144 mmol/L - - 139  Potassium 3.5 - 5.2 mmol/L - - 4.3  Chloride 96 - 106 mmol/L - - 100  CO2 20 - 29 mmol/L - -  24  Calcium 8.7 - 10.2 mg/dL - - 9.9  Total Protein 6.0 - 8.5 g/dL 7.5 7.8 7.5  Total Bilirubin 0.0 - 1.2 mg/dL 0.6 0.2 0.3  Alkaline Phos 39 - 117 IU/L 75 65 56  AST 0 - 40 IU/L 75(H) 56(H) 43(H)  ALT 0 - 44 IU/L 88(H) 67(H) 64(H)   Lipid Panel     Component Value Date/Time   CHOL 183 02/01/2019 1053   TRIG 243 (H) 02/01/2019 1053   HDL 35 (L) 02/01/2019 1053   CHOLHDL 5.2 (H) 02/01/2019 1053   CHOLHDL 5.1 (H) 11/28/2016 1232   VLDL 70 (H) 11/28/2016 1232   LDLCALC 99 02/01/2019 1053    CBC    Component Value Date/Time   WBC 5.1 02/01/2019  1053   WBC 4.4 12/06/2017 2310   RBC 4.22 02/01/2019 1053   RBC 4.49 12/06/2017 2310   HGB 13.7 02/01/2019 1053   HCT 39.0 02/01/2019 1053   PLT 258 02/01/2019 1053   MCV 92 02/01/2019 1053   MCH 32.5 02/01/2019 1053   MCH 32.7 12/06/2017 2310   MCHC 35.1 02/01/2019 1053   MCHC 36.4 (H) 12/06/2017 2310   RDW 12.2 02/01/2019 1053   LYMPHSABS 2.5 12/06/2017 2310   MONOABS 0.4 12/06/2017 2310   EOSABS 0.1 12/06/2017 2310   BASOSABS 0.0 12/06/2017 2310    ASSESSMENT AND PLAN: 1. Type 2 diabetes mellitus with diabetic polyneuropathy, with long-term current use of insulin (HCC) Not at goal due to being out of 2 of his diabetic medications.  Refill given on Lantus and Humalog.  I asked whether he wanted [place] to change the Janumet to the individual components to see if the cost would be less, however patient declines so I sent a refill on the [place].  He will let me know if any or all of these medicines are not covered by his insurance. - POCT glucose (manual entry) - POCT glycosylated hemoglobin (Hb A1C) - Ambulatory referral to Ophthalmology - CBC - Comprehensive metabolic panel - Lipid panel - Microalbumin / creatinine urine ratio - gabapentin (NEURONTIN) 300 MG capsule; Take 1 capsule (300 mg total) by mouth at bedtime.  Dispense: 30 capsule; Refill: 11 - insulin glargine (LANTUS SOLOSTAR) 100 UNIT/ML Solostar Pen; Inject 54 Units into the skin at bedtime.  Dispense: 15 mL; Refill: 5 - insulin lispro (HUMALOG KWIKPEN) 100 UNIT/ML KwikPen; Inject 0.27 mLs (27 Units total) into the skin 3 (three) times daily.  Dispense: 15 mL; Refill: 11 - sitaGLIPtin-metformin (JANUMET) 50-1000 MG tablet; Take 1 tablet by mouth 2 (two) times daily with a meal.  Dispense: 180 tablet; Refill: 3 - Ambulatory referral to Endocrinology  2. Essential hypertension Not at goal.  Patient has not taken medicines as yet for today.  He will continue current medications - amLODipine (NORVASC) 10 MG tablet; Take 1  tablet (10 mg total) by mouth daily.  Dispense: 90 tablet; Refill: 3 - lisinopril (ZESTRIL) 10 MG tablet; Take 1 tablet (10 mg total) by mouth daily.  Dispense: 90 tablet; Refill: 3  3. 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 Seizure disorder [facility]) We will refer him to neurology to advise whether it is okay for him to remain off Dilantin and Keppra since he has not had a seizure in 2 years.  However in the meantime we will restart Keppra - Ambulatory referral to Neurology - levETIRAcetam (KEPPRA XR) 500  MG 24 hr tablet; Take 1 tablet (500 mg total) by mouth daily.  Dispense: 90 tablet; Refill: 4  5. Avulsion of toenail, initial encounter Given samples of Bactroban ointment.  Told to clean the area once a day and then apply the ointment and then cover with small gauze.  6. Abnormal LFTs Recheck LFTs today  7. Loud snoring History suggest possible obstructive sleep apnea.  Will refer for sleep study. - PSG Sleep Study; Future    Patient was given the opportunity to ask questions.  Patient verbalized understanding of the plan and was able to repeat key elements of the plan.   Orders Placed This Encounter  Procedures  . CBC  . Comprehensive metabolic panel  . Lipid panel  . Microalbumin / creatinine urine ratio  . Ambulatory referral to Ophthalmology  . Ambulatory referral to Neurology  . Ambulatory referral to Endocrinology  . POCT glucose (manual entry)  . POCT glycosylated hemoglobin (Hb A1C)  . PSG Sleep Study     Requested Prescriptions   Signed Prescriptions Disp Refills  . gabapentin (NEURONTIN) 300 MG capsule 30 capsule 11    Sig: Take 1 capsule (300 mg total) by mouth at bedtime.  . levETIRAcetam (KEPPRA XR) 500 MG 24 hr tablet 90 tablet 4    Sig: Take 1 tablet (500 mg total) by mouth daily.  . insulin glargine (LANTUS SOLOSTAR) 100  UNIT/ML Solostar Pen 15 mL 5    Sig: Inject 54 Units into the skin at bedtime.  . insulin lispro (HUMALOG KWIKPEN) 100 UNIT/ML KwikPen 15 mL 11    Sig: Inject 0.27 mLs (27 Units total) into the skin 3 (three) times daily.  Marland Kitchen amLODipine (NORVASC) 10 MG tablet 90 tablet 3    Sig: Take 1 tablet (10 mg total) by mouth daily.  Marland Kitchen lisinopril (ZESTRIL) 10 MG tablet 90 tablet 3    Sig: Take 1 tablet (10 mg total) by mouth daily.  . sitaGLIPtin-metformin (JANUMET) 50-1000 MG tablet 180 tablet 3    Sig: Take 1 tablet by mouth 2 (two) times daily with a meal.    Return in about 3 months (around 07/17/2020) for [facility] in 2 wks for BP recheck.Karle Plumber, MD, FACP

## 2020-04-16 NOTE — Progress Notes (Signed)
co

## 2020-04-16 NOTE — Patient Instructions (Signed)
Apply Bactroban ointment to the left big toe daily and cover with a small gauze.

## 2020-04-17 LAB — CBC
Hematocrit: 43.9 % (ref 37.5–51.0)
Hemoglobin: 14.7 g/dL (ref 13.0–17.7)
MCH: 31.3 pg (ref 26.6–33.0)
MCHC: 33.5 g/dL (ref 31.5–35.7)
MCV: 94 fL (ref 79–97)
Platelets: 296 10*3/uL (ref 150–450)
RBC: 4.69 x10E6/uL (ref 4.14–5.80)
RDW: 11.7 % (ref 11.6–15.4)
WBC: 5 10*3/uL (ref 3.4–10.8)

## 2020-04-17 LAB — COMPREHENSIVE METABOLIC PANEL
ALT: 45 IU/L — ABNORMAL HIGH (ref 0–44)
AST: 25 IU/L (ref 0–40)
Albumin/Globulin Ratio: 1.2 (ref 1.2–2.2)
Albumin: 4.2 g/dL (ref 4.0–5.0)
Alkaline Phosphatase: 73 IU/L (ref 48–121)
BUN/Creatinine Ratio: 21 — ABNORMAL HIGH (ref 9–20)
BUN: 18 mg/dL (ref 6–24)
Bilirubin Total: 0.4 mg/dL (ref 0.0–1.2)
CO2: 26 mmol/L (ref 20–29)
Calcium: 9.8 mg/dL (ref 8.7–10.2)
Chloride: 102 mmol/L (ref 96–106)
Creatinine, Ser: 0.85 mg/dL (ref 0.76–1.27)
GFR calc Af Amer: 119 mL/min/{1.73_m2} (ref >59)
GFR calc non Af Amer: 103 mL/min/{1.73_m2} (ref >59)
Globulin, Total: 3.4 g/dL (ref 1.5–4.5)
Glucose: 350 mg/dL — ABNORMAL HIGH (ref 65–99)
Potassium: 4.8 mmol/L (ref 3.5–5.2)
Sodium: 138 mmol/L (ref 134–144)
Total Protein: 7.6 g/dL (ref 6.0–8.5)

## 2020-04-17 LAB — LIPID PANEL
Chol/HDL Ratio: 4.5 ratio (ref 0.0–5.0)
Cholesterol, Total: 211 mg/dL — ABNORMAL HIGH (ref 100–199)
HDL: 47 mg/dL (ref >39)
LDL Chol Calc (NIH): 134 mg/dL — ABNORMAL HIGH (ref 0–99)
Triglycerides: 169 mg/dL — ABNORMAL HIGH (ref 0–149)
VLDL Cholesterol Cal: 30 mg/dL (ref 5–40)

## 2020-04-17 LAB — MICROALBUMIN / CREATININE URINE RATIO
Creatinine, Urine: 60 mg/dL
Microalb/Creat Ratio: 405 mg/g creat — ABNORMAL HIGH (ref 0–29)
Microalbumin, Urine: 243 ug/mL

## 2020-04-18 ENCOUNTER — Other Ambulatory Visit: Payer: Self-pay | Admitting: Internal Medicine

## 2020-04-18 MED ORDER — ATORVASTATIN CALCIUM 20 MG PO TABS: 20.0000 mg | ORAL_TABLET | Freq: Every day | ORAL | 3 refills | Status: DC

## 2020-04-23 NOTE — BH Specialist Note (Signed)
Integrated Behavioral Health Initial Visit  MRN: [id number] Name: Albert Garcia  Number of Integrated Behavioral Health Clinician visits:: 1/6 Session Start time: 10:30 AM  Session End time: 10:45 AM Total time: 15  Type of Service: Integrated Behavioral Health- Individual Interpretor:No. Interpretor Name and Language: NA   Warm Hand Off Completed.       SUBJECTIVE: Albert Garcia is a 49 y.o. male accompanied by self Patient was referred by Dr. Laural Benes for stress. Patient reports the following symptoms/concerns: Patient reports psychosocial stressors, including side effects to prescribed medications Duration of problem: Ongoing; Severity of problem: mild  OBJECTIVE: Mood: Pleasant and Affect: Appropriate Risk of harm to self or others: No plan to harm self or others  LIFE CONTEXT: Family and Social: Patient receives support from family and friends School/Work: Patient is employed full-time he is insured Self-Care: Patient denies substance use Life Changes: Ongoing stressors  GOALS ADDRESSED: Patient will: 1. Reduce symptoms of: stress 2. Increase knowledge and/or ability of: stress reduction  3. Demonstrate ability to: Increase healthy adjustment to current life circumstances and Increase motivation to adhere to plan of care  INTERVENTIONS: Interventions utilized: Solution-Focused Strategies and Supportive Counseling  Standardized Assessments completed: GAD-7 and PHQ 2&9  ASSESSMENT: Patient currently experiencing psychosocial stressors and reports difficulty managing physical health conditions.   Patient may benefit from utilizing strategies discussed to assist with stress management and compliance to prescribed medications.  PLAN: 1. Follow up with behavioral health clinician on : Contact LCSW with any additional behavioral health and/or resource needs 2. Behavioral recommendations: 3. Strategies discussed and comply with prescribed medications 4. Referral(s):  [facility]ated [employer] (In Clinic) 5. "From scale of 1-10, how likely are you to follow plan?":   Bridgett Larsson, LCSW 04/23/2020 3:12 PM

## 2020-05-01 ENCOUNTER — Ambulatory Visit: Payer: BC Managed Care – PPO | Attending: Internal Medicine | Admitting: Pharmacist

## 2020-05-01 ENCOUNTER — Other Ambulatory Visit: Payer: Self-pay

## 2020-05-01 ENCOUNTER — Encounter: Payer: Self-pay | Admitting: Pharmacist

## 2020-05-01 VITALS — BP 158/88 | HR 80

## 2020-05-01 DIAGNOSIS — I10 Essential (primary) hypertension: Secondary | ICD-10-CM | POA: Diagnosis not present

## 2020-05-01 NOTE — Progress Notes (Signed)
   S:    PCP: Dr. Laural Benes  Patient arrives in good spirits. Presents to the clinic for BP check.  Patient was referred and last seen by Primary Care Provider on 04/16/2020.   Medication adherence reported. He takes amlodipine in the evening and lisinopril in the morning. He has not had the lisinopril today.   Current BP Medications include:  Amlodipine 10 mg daily, lisinopril 10 mg daily   Antihypertensives tried in the past include: HCTZ (ED), quinapril-HCTZ, losartan, lisinopril-HCTZ  Dietary habits include: compliant with salt restriction; denies excessive intake of caffeine  Exercise habits include: none outside of work; works 10-11 hours/day Family / Social history:  - FHx: HTN, DM, kidney failure  - Tobacco: never smoker - Alcohol: only occasionally   O:  Vitals:   05/01/20 0919  BP: (!) 158/88  Pulse: 80   Home BP readings: none  Last 3 Office BP readings: BP Readings from Last 3 Encounters:  05/01/20 (!) 158/88  04/16/20 (!) 152/93  10/13/19 (!) 154/99   BMET    Component Value Date/Time   NA 138 04/16/2020 1146   K 4.8 04/16/2020 1146   CL 102 04/16/2020 1146   CO2 26 04/16/2020 1146   GLUCOSE 350 (H) 04/16/2020 1146   GLUCOSE 411 (H) 12/06/2017 2310   BUN 18 04/16/2020 1146   CREATININE 0.85 04/16/2020 1146   CREATININE 0.81 11/28/2016 1232   CALCIUM 9.8 04/16/2020 1146   GFRNONAA 103 04/16/2020 1146   GFRNONAA >89 11/28/2016 1232   GFRAA 119 04/16/2020 1146   GFRAA >89 11/28/2016 1232    Renal function: CrCl cannot be calculated (Unknown ideal weight.).  Clinical ASCVD: No  The 10-year ASCVD risk score Denman George DC Jr., et al., 2013) is: 21.7%   Values used to calculate the score:     Age: 49 years     Sex: Male     Is Non-Hispanic African American: Yes     Diabetic: Yes     Tobacco smoker: No     Systolic Blood Pressure: 158 mmHg     Is BP treated: Yes     HDL Cholesterol: 47 mg/dL     Total Cholesterol: 211 mg/dL  A/P: Hypertension  longstanding currently above goal on current medications. BP Goal = < 130/80 mmHg. Medication adherence reported but he did not take his lisinopril this AM before his appointment. He has obtained a home BP cuff but does not have any readings available. I have instructed him to return in 1 week with home readings. He was again instructed to take lisinopril before seeing me.   -Continued current regimen for now. -Obtain home values for review at f/u   -Advised to take lisinopril the morning of his f/u BP check next week.  -Counseled on lifestyle modifications for blood pressure control including reduced dietary sodium, increased exercise, adequate sleep.  Results reviewed and written information provided.   Total time in face-to-face counseling 15 minutes.   F/U Clinic Visit in 1 week.    Butch Penny, PharmD, CPP Clinical Pharmacist [facility] [phone]

## 2020-05-11 ENCOUNTER — Other Ambulatory Visit: Payer: Self-pay

## 2020-05-11 ENCOUNTER — Encounter (HOSPITAL_BASED_OUTPATIENT_CLINIC_OR_DEPARTMENT_OTHER): Payer: Self-pay | Admitting: *Deleted

## 2020-05-11 ENCOUNTER — Emergency Department (HOSPITAL_BASED_OUTPATIENT_CLINIC_OR_DEPARTMENT_OTHER)
Admission: EM | Admit: 2020-05-11 | Discharge: 2020-05-11 | Disposition: A | Discharge status: Home / Self Care | Payer: BC Managed Care – PPO | Attending: Emergency Medicine | Admitting: Emergency Medicine

## 2020-05-11 ENCOUNTER — Emergency Department (HOSPITAL_COMMUNITY): Payer: BC Managed Care – PPO

## 2020-05-11 DIAGNOSIS — M4802 Spinal stenosis, cervical region: Secondary | ICD-10-CM | POA: Diagnosis not present

## 2020-05-11 DIAGNOSIS — R200 Anesthesia of skin: Secondary | ICD-10-CM

## 2020-05-11 DIAGNOSIS — M5011 Cervical disc disorder with radiculopathy,  high cervical region: Secondary | ICD-10-CM | POA: Diagnosis not present

## 2020-05-11 DIAGNOSIS — M79601 Pain in right arm: Secondary | ICD-10-CM | POA: Diagnosis not present

## 2020-05-11 DIAGNOSIS — M4722 Other spondylosis with radiculopathy, cervical region: Secondary | ICD-10-CM | POA: Diagnosis not present

## 2020-05-11 DIAGNOSIS — M50121 Cervical disc disorder at C4-C5 level with radiculopathy: Secondary | ICD-10-CM | POA: Diagnosis not present

## 2020-05-11 DIAGNOSIS — I10 Essential (primary) hypertension: Secondary | ICD-10-CM | POA: Diagnosis not present

## 2020-05-11 DIAGNOSIS — M25511 Pain in right shoulder: Secondary | ICD-10-CM | POA: Diagnosis not present

## 2020-05-11 DIAGNOSIS — E119 Type 2 diabetes mellitus without complications: Secondary | ICD-10-CM | POA: Diagnosis not present

## 2020-05-11 LAB — BASIC METABOLIC PANEL
Anion gap: 8 (ref 5–15)
BUN: 14 mg/dL (ref 6–20)
CO2: 29 mmol/L (ref 22–32)
Calcium: 9.1 mg/dL (ref 8.9–10.3)
Chloride: 100 mmol/L (ref 98–111)
Creatinine, Ser: 0.83 mg/dL (ref 0.61–1.24)
GFR calc Af Amer: >60 mL/min (ref >60)
GFR calc non Af Amer: >60 mL/min (ref >60)
Glucose, Bld: 233 mg/dL — ABNORMAL HIGH (ref 70–99)
Potassium: 3.7 mmol/L (ref 3.5–5.1)
Sodium: 137 mmol/L (ref 135–145)

## 2020-05-11 LAB — CBC
HCT: 36.5 % — ABNORMAL LOW (ref 39.0–52.0)
Hemoglobin: 12.9 g/dL — ABNORMAL LOW (ref 13.0–17.0)
MCH: 32.7 pg (ref 26.0–34.0)
MCHC: 35.3 g/dL (ref 30.0–36.0)
MCV: 92.4 fL (ref 80.0–100.0)
Platelets: 252 10*3/uL (ref 150–400)
RBC: 3.95 MIL/uL — ABNORMAL LOW (ref 4.22–5.81)
RDW: 12.3 % (ref 11.5–15.5)
WBC: 3.8 10*3/uL — ABNORMAL LOW (ref 4.0–10.5)
nRBC: 0 % (ref 0.0–0.2)

## 2020-05-11 MED ORDER — DEXAMETHASONE SODIUM PHOSPHATE 10 MG/ML IJ SOLN
10.0000 mg | Freq: Once | INTRAMUSCULAR | Status: AC
  Administered 2020-05-11: 10 mg via INTRAVENOUS
  Filled 2020-05-11: qty 1

## 2020-05-11 MED ORDER — PREDNISONE 20 MG PO TABS
ORAL_TABLET | ORAL | 0 refills | Status: DC
Start: 2020-05-11 — End: 2020-05-16

## 2020-05-11 MED ORDER — MORPHINE SULFATE (PF) 4 MG/ML IV SOLN
4.0000 mg | Freq: Once | INTRAVENOUS | Status: AC
  Administered 2020-05-11: 4 mg via INTRAVENOUS
  Filled 2020-05-11: qty 1

## 2020-05-11 MED ORDER — IBUPROFEN 600 MG PO TABS
600.0000 mg | ORAL_TABLET | Freq: Four times a day (QID) | ORAL | 0 refills | Status: DC | PRN
Start: 2020-05-11 — End: 2022-08-05

## 2020-05-11 NOTE — Discharge Instructions (Addendum)
We suspect that the pain in your right arm is related to a problem in your neck. 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  Take medications prescribed. Call and follow up with neurosurgeon for further management of your condition. Return if you have any concerns.

## 2020-05-11 NOTE — ED Provider Notes (Signed)
Received patient in transfer from med [facility] ER.  This is a 49 year old male initially presented with radicular pain radiates to his right arm along with neck and right shoulder pain.  He has been transferred here with concerns of radicular versus myelopathic cervical pain.  He would need to have an MRI of his cervical spine.  At this time patient complaining of pain to his right arm as well as swelling to both hands.  He request for pain medication.  Will provide pain medication, on examination he has intact radial pulse bilaterally, decreased grip strength bilaterally with normal sensation throughout both arms.  1:41 PM Labs are reassuring.  Cervical spine MRI demonstrate multiple degenerative changes and multilevel cervical spines with large central to right paracentral disc protrusion at C3-C4 along with moderate to severe right neuroforaminal stenosis at C3-C4, C5-C6 and C6-C7.  Finding corresponds to patient's radicular pain.  No cord compression.  Pt d/c home with pain medication and steroid.  outpt f/u with neurosurgeon recommended.  Return precaution given.  Care discussed with Dr. Donnald Garre.   BP (!) 140/92    Pulse 77    Temp 98.2 F (36.8 C) (Oral)    Resp 20    Ht 5\' 8"  (1.727 m)    Wt 117.9 kg    SpO2 98%    BMI 39.53 kg/m   Results for orders placed or performed during the hospital encounter of 05/11/20  CBC  Result Value Ref Range   WBC 3.8 (L) 4.0 - 10.5 K/uL   RBC 3.95 (L) 4.22 - 5.81 MIL/uL   Hemoglobin 12.9 (L) 13.0 - 17.0 g/dL   HCT 05/13/20 (L) 39 - 52 %   MCV 92.4 80.0 - 100.0 fL   MCH 32.7 26.0 - 34.0 pg   MCHC 35.3 30.0 - 36.0 g/dL   RDW 85.8 85.0 - 27.7 %   Platelets 252 150 - 400 K/uL   nRBC 0.0 0.0 - 0.2 %  Basic metabolic panel  Result Value Ref Range   Sodium 137 135 - 145 mmol/L   Potassium 3.7 3.5 - 5.1 mmol/L   Chloride 100 98 - 111 mmol/L   CO2 29 22 - 32 mmol/L   Glucose, Bld 233 (H) 70 - 99 mg/dL   BUN 14 6 - 20 mg/dL   Creatinine, Ser 41.2 0.61  - 1.24 mg/dL   Calcium 9.1 8.9 - 8.78 mg/dL   GFR calc non Af Amer >60 >60 mL/min   GFR calc Af Amer >60 >60 mL/min   Anion gap 8 5 - 15   MR Cervical Spine Wo Contrast  Result Date: 05/11/2020 CLINICAL DATA:  Right-sided neck, shoulder, and arm pain for the past 1-2 days. EXAM: MRI CERVICAL SPINE WITHOUT CONTRAST TECHNIQUE: Multiplanar, multisequence MR imaging of the cervical spine was performed. No intravenous contrast was administered. COMPARISON:  Cervical spine x-rays dated February 10, 2015. FINDINGS: Alignment: Straightening of the normal cervical lordosis. Vertebrae: No fracture, evidence of discitis, or bone lesion. Cord: Normal signal and morphology. Posterior Fossa, vertebral arteries, paraspinal tissues: Negative. Disc levels: C2-C3:  Small central disc protrusion.  No stenosis. C3-C4: Large central and right paracentral disc extrusion migrating superiorly slight flattening of the right ventral cord. Moderate right and mild left uncovertebral hypertrophy. Mild to moderate spinal canal stenosis. Moderate to severe right and mild left neuroforaminal stenosis. C4-C5: Small left paracentral disc protrusion. Mild right uncovertebral hypertrophy. Slight flattening of the left ventral cord. Mild right neuroforaminal stenosis. No spinal  canal or left neuroforaminal stenosis. C5-C6: Mild disc bulging and bilateral uncovertebral hypertrophy. Moderate bilateral neuroforaminal stenosis. No spinal canal stenosis. C6-C7: Mild disc bulging with superimposed small right subarticular disc protrusion. Mild right uncovertebral hypertrophy. Moderate right neuroforaminal stenosis. No spinal canal or left neuroforaminal stenosis. C7-T1:  Negative. IMPRESSION: 1. Multilevel degenerative changes of the cervical spine as described above. Large central and right paracentral disc extrusion at C3-C4 with slight flattening of the right ventral cord and mild to moderate spinal canal stenosis. 2. Moderate to severe right  neuroforaminal stenosis at C3-C4 due to uncovertebral hypertrophy. 3. Moderate bilateral neuroforaminal stenosis at C5-C6. 4. Moderate right neuroforaminal stenosis at C6-C7. Electronically Signed   By: Obie Dredge M.D.   On: 05/11/2020 12:10         Fayrene Helper, PA-C 05/11/20 1454    Arby Barrette, MD 05/13/20 [id number]

## 2020-05-11 NOTE — ED Notes (Signed)
Pt transported to MRI 

## 2020-05-11 NOTE — ED Notes (Signed)
Report called to [place]  Charge rn at [employer]

## 2020-05-11 NOTE — ED Triage Notes (Signed)
Rt arm and shoulder pain radiating to neck w movement x 2 days  Denies inj

## 2020-05-11 NOTE — ED Provider Notes (Signed)
[facility] Emergency Department Provider Note MRN:  [id number]  Arrival date & time: 05/11/20     Chief Complaint   Shoulder Pain   History of Present Illness   Albert Garcia is a 49 y.o. year-old male with a history of hypertension, diabetes presenting to the ED with chief complaint of shoulder pain.  Issues with neck and right shoulder pain radiating down the right arm for the past 1 or 2 days.  Has noticed some decreased sensation to the fingers of the right hand during this same amount of time.  Today at work he turned his head to the right and this triggered a severe pain to the right arm.  Denies headache or vision change, denies weakness to the arms or legs, no chest pain or shortness of breath, no abdominal pain, no numbness to the legs, no bowel or bladder dysfunction.  Review of Systems  A complete 10 system review of systems was obtained and all systems are negative except as noted in the HPI and PMH.   Patient's Health History    Past Medical History:  Diagnosis Date  . Diabetes mellitus   . Erectile dysfunction   . Headache   . Hypertension   . Seizures (HCC)     Past Surgical History:  Procedure Laterality Date  . NO PAST SURGERIES    . REFRACTIVE SURGERY Bilateral 04/2018    Family History  Problem Relation Age of Onset  . Hypertension Mother   . Diabetes Mother   . Lupus Mother   . Kidney failure Mother   . Diabetes Father   . Hypertension Father   . Kidney failure Father     Social History   Socioeconomic History  . Marital status: Legally Separated    Spouse name: Not on file  . Number of children: 3  . Years of education: Not on file  . Highest education level: Not on file  Occupational History  . Occupation: Midwife: [employer]  Tobacco Use  . Smoking status: Never Smoker  . Smokeless tobacco: Never Used  Vaping Use  . Vaping Use: Never used  Substance and Sexual Activity  . Alcohol  use: Yes    Alcohol/week: 0.0 standard drinks    Comment: occ  . Drug use: No  . Sexual activity: Not on file  Other Topics Concern  . Not on file  Social History Narrative  . Not on file   Social Determinants of Health   Financial Resource Strain:   . Difficulty of Paying Living Expenses:   Food Insecurity:   . Worried About Programme researcher, broadcasting/film/video in the Last Year:   . Barista in the Last Year:   Transportation Needs:   . Freight forwarder (Medical):   Marland Kitchen Lack of Transportation (Non-Medical):   Physical Activity:   . Days of Exercise per Week:   . Minutes of Exercise per Session:   Stress:   . Feeling of Stress :   Social Connections:   . Frequency of Communication with Friends and Family:   . Frequency of Social Gatherings with Friends and Family:   . Attends Religious Services:   . Active Member of Clubs or Organizations:   . Attends Banker Meetings:   Marland Kitchen Marital Status:   Intimate Partner Violence:   . Fear of Current or Ex-Partner:   . Emotionally Abused:   Marland Kitchen Physically Abused:   . Sexually Abused:  Physical Exam   Vitals:   05/11/20 0838  BP: (!) 150/91  Pulse: 84  Resp: 18  Temp: 98.2 F (36.8 C)  SpO2: 98%    CONSTITUTIONAL: Well-appearing, NAD NEURO:  Alert and oriented x 3, no focal deficits EYES:  eyes equal and reactive ENT/NECK:  no LAD, no JVD CARDIO: Regular rate, well-perfused, normal S1 and S2 PULM:  CTAB no wheezing or rhonchi GI/GU:  normal bowel sounds, non-distended, non-tender MSK/SPINE:  No gross deformities, no edema, Spurling sign positive on the right SKIN:  no rash, atraumatic PSYCH:  Appropriate speech and behavior  *Additional and/or pertinent findings included in MDM below  Diagnostic and Interventional Summary    EKG Interpretation  Date/Time:    Ventricular Rate:    PR Interval:    QRS Duration:   QT Interval:    QTC Calculation:   R Axis:     Text Interpretation:        Labs  Reviewed  CBC  BASIC METABOLIC PANEL    MR Cervical Spine Wo Contrast    (Results Pending)    Medications - No data to display   Procedures  /  Critical Care Procedures  ED Course and Medical Decision Making  I have reviewed the triage vital signs, the nursing notes, and pertinent available records from the EMR.  Listed above are laboratory and imaging tests that I personally ordered, reviewed, and interpreted and then considered in my medical decision making (see below for details).      Concern for radicular versus myelopathic cervical pain, given the new numbness to the fingers of the right hand, will transfer to obtain MRI today.  Accepting ED physician Dr. Clarice Pole at [facility].  Patient will have a friend drive him to [facility] via private vehicle.    Elmer Sow. Pilar Plate, MD [facility] Emergency Medicine [facility] [email or web address]  Final Clinical Impressions(s) / ED Diagnoses     ICD-10-CM   1. Numbness  R20.0   2. Pain of right upper extremity  M79.601     ED Discharge Orders    None       Discharge Instructions Discussed with and Provided to Patient:     Discharge Instructions     We suspect that the pain in your right arm is related to a problem in your neck.  Given the decreased sensation to the fingers of your right hand, we feel that you need an MRI today to make sure there are no emergencies.  We are sending you to the [facility] emergency department for MRI, please go directly there.      Sabas Sous, MD 05/11/20 0900

## 2020-05-16 ENCOUNTER — Ambulatory Visit: Payer: BC Managed Care – PPO | Attending: Internal Medicine | Admitting: Pharmacist

## 2020-05-16 ENCOUNTER — Other Ambulatory Visit: Payer: Self-pay

## 2020-05-16 ENCOUNTER — Encounter: Payer: Self-pay | Admitting: Pharmacist

## 2020-05-16 VITALS — BP 136/83 | HR 87 | Temp 97.9°F | Ht 70.08 in | Wt 262.0 lb

## 2020-05-16 DIAGNOSIS — Z794 Long term (current) use of insulin: Secondary | ICD-10-CM | POA: Diagnosis not present

## 2020-05-16 DIAGNOSIS — H40023 Open angle with borderline findings, high risk, bilateral: Secondary | ICD-10-CM | POA: Diagnosis not present

## 2020-05-16 DIAGNOSIS — H35033 Hypertensive retinopathy, bilateral: Secondary | ICD-10-CM | POA: Diagnosis not present

## 2020-05-16 DIAGNOSIS — H25813 Combined forms of age-related cataract, bilateral: Secondary | ICD-10-CM | POA: Diagnosis not present

## 2020-05-16 DIAGNOSIS — E1142 Type 2 diabetes mellitus with diabetic polyneuropathy: Secondary | ICD-10-CM | POA: Diagnosis not present

## 2020-05-16 DIAGNOSIS — E113512 Type 2 diabetes mellitus with proliferative diabetic retinopathy with macular edema, left eye: Secondary | ICD-10-CM | POA: Diagnosis not present

## 2020-05-16 LAB — GLUCOSE, POCT (MANUAL RESULT ENTRY): POC Glucose: 223 mg/dl — AB (ref 70–99)

## 2020-05-16 LAB — HM DIABETES EYE EXAM

## 2020-05-16 NOTE — Progress Notes (Signed)
S:    PCP: Dr. Laural Benes  Patient arrives in good spirits. Presents to the clinic for his 2-week BP follow-up appointment. Patient was referred and last seen by Primary Care Provider on 04/16/20 and last seen by Clinical Pharmacist on 05/01/20.  Patient reports adherence with amlodipine and lisinopril. 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 Within the last two weeks, patient reports only missing one dose. Patient does not have a BP monitor and is unable to check BP at home, but states that he plans to purchase one this Friday. Additionally, patient reports that he is still having significant shoulder and neck pain (pain score 7/10) daily and that he will need to see a neurosurgeon. He takes Technical brewer (Aspirin 500 mg and Caffeine 32.5 mg per tablet). He reports that he takes 1500 mg every 5-6 hours to help reduce the pain. He has not tried Tylenol. Regarding his diabetes, he also states that his sister is taking Victoza and wants to try it as he has been on insulin for about 10+ years. He reports home sugars have been high up to 250-450 and low to 70-180 in which he starts experiencing shakiness, sweating, and weakness.   Current BP Medications include: Amlodipine 10 mg daily, lisinopril 10 mg daily   Antihypertensives tried in the past include: HCTZ (ED), quinapril-HCTZ, losartan, lisinopril-HCTZ  Dietary habits include: compliant with salt restriction; denies excessive intake of caffeine  Exercise habits include: none outside of work; works 10-11 hours/day Family / Social history:  - FHx: HTN, DM, kidney failure  - Tobacco: never smoker - Alcohol: only occasionally    O:  Physical Exam  ROS  Home BP readings: none  Last 3 Office BP readings: BP Readings from Last 3 Encounters:  05/16/20 136/83  05/11/20 126/77  05/01/20 (!) 158/88    BMET    Component Value Date/Time   NA 137 05/11/2020 0909   NA 138  04/16/2020 1146   K 3.7 05/11/2020 0909   CL 100 05/11/2020 0909   CO2 29 05/11/2020 0909   GLUCOSE 233 (H) 05/11/2020 0909   BUN 14 05/11/2020 0909   BUN 18 04/16/2020 1146   CREATININE 0.83 05/11/2020 0909   CREATININE 0.81 11/28/2016 1232   CALCIUM 9.1 05/11/2020 0909   GFRNONAA >60 05/11/2020 0909   GFRNONAA >89 11/28/2016 1232   GFRAA >60 05/11/2020 0909   GFRAA >89 11/28/2016 1232    Renal function: Estimated Creatinine Clearance: 140.7 mL/min (by C-G formula based on SCr of 0.83 mg/dL).  Clinical ASCVD: No  The 10-year ASCVD risk score Denman George DC Jr., et al., 2013) is: 16.8%   Values used to calculate the score:     Age: 50 years     Sex: Male     Is Non-Hispanic African American: Yes     Diabetic: Yes     Tobacco smoker: No     Systolic Blood Pressure: 136 mmHg     Is BP treated: Yes     HDL Cholesterol: 47 mg/dL     Total Cholesterol: 211 mg/dL  A/P: Hypertension above goal at 136/83 on amlodipine 10 mg and lisinopril 10 mg daily. BP Goal = < 130/80 mmHg. Patient is adherent with his BP medications. Patient does not have a BP monitor and unable to check BP at home. Explained that continuous use of Bayer Back and Body can cause elevated blood pressure and may cause kidney  damage in the future. Recommended to alternate between Tylenol and Bayer Back and Body for his shoulder and neck pain and follow-up with the neurosurgeon. -Continued Amlodipine 10 mg daily -Continued Lisinopril 10 mg daily -Encouraged patient to purchase BP monitor this Friday, check BP daily 1-2 hours after taking BP medications, write down readings and bring to next follow-up visit in 1 month -Counseled on lifestyle modifications for blood pressure control including reduced dietary sodium, increased exercise, adequate sleep.  Diabetes: POCT glucose reading today was elevated at 223 (fasting) and home glucose readings are extremely elevated ranging from 250-400 (highs) and 70-180 (lows). Currently on  Basaglar 54 units at bedtime and Humalog 30 units TID. Patient has an upcoming appointment with Endocrinology, Dr. Everardo All, on 9/1. -Will reach out to Dr. Laural Benes to start Victoza if appropriate or defer to Dr. Everardo All.   Results reviewed and written information provided.   Total time in face-to-face counseling 30 minutes.   F/U Clinic Visit in 1 month.  Fabio Neighbors, PharmD PGY2 Ambulatory Care Resident [facility]  Pharmacy

## 2020-05-23 ENCOUNTER — Encounter: Payer: Self-pay | Admitting: Nurse Practitioner

## 2020-05-23 ENCOUNTER — Ambulatory Visit: Payer: BC Managed Care – PPO | Admitting: Neurology

## 2020-05-23 ENCOUNTER — Other Ambulatory Visit: Payer: Self-pay

## 2020-05-23 ENCOUNTER — Ambulatory Visit (INDEPENDENT_AMBULATORY_CARE_PROVIDER_SITE_OTHER): Payer: BC Managed Care – PPO | Admitting: Nurse Practitioner

## 2020-05-23 ENCOUNTER — Encounter: Payer: Self-pay | Admitting: Neurology

## 2020-05-23 VITALS — BP 108/68 | HR 98 | Ht 68.0 in | Wt 264.0 lb

## 2020-05-23 VITALS — BP 136/87 | HR 87 | Temp 98.7°F | Resp 16 | Ht 68.0 in | Wt 261.0 lb

## 2020-05-23 DIAGNOSIS — E1121 Type 2 diabetes mellitus with diabetic nephropathy: Secondary | ICD-10-CM

## 2020-05-23 DIAGNOSIS — I10 Essential (primary) hypertension: Secondary | ICD-10-CM | POA: Diagnosis not present

## 2020-05-23 DIAGNOSIS — G40009 Localization-related (focal) (partial) idiopathic epilepsy and epileptic syndromes with seizures of localized onset, not intractable, without status epilepticus: Secondary | ICD-10-CM | POA: Diagnosis not present

## 2020-05-23 DIAGNOSIS — G40909 Epilepsy, unspecified, not intractable, without status epilepticus: Secondary | ICD-10-CM

## 2020-05-23 DIAGNOSIS — Z794 Long term (current) use of insulin: Secondary | ICD-10-CM

## 2020-05-23 DIAGNOSIS — E11311 Type 2 diabetes mellitus with unspecified diabetic retinopathy with macular edema: Secondary | ICD-10-CM

## 2020-05-23 DIAGNOSIS — E1142 Type 2 diabetes mellitus with diabetic polyneuropathy: Secondary | ICD-10-CM

## 2020-05-23 DIAGNOSIS — K219 Gastro-esophageal reflux disease without esophagitis: Secondary | ICD-10-CM

## 2020-05-23 LAB — POCT URINALYSIS DIPSTICK
Bilirubin, UA: NEGATIVE
Blood, UA: NEGATIVE
Glucose, UA: NEGATIVE
Ketones, UA: NEGATIVE
Leukocytes, UA: NEGATIVE
Nitrite, UA: NEGATIVE
Protein, UA: POSITIVE — AB
Spec Grav, UA: >=1.03 — AB (ref 1.010–1.025)
Urobilinogen, UA: 0.2 E.U./dL
pH, UA: 6 (ref 5.0–8.0)

## 2020-05-23 LAB — GLUCOSE, POCT (MANUAL RESULT ENTRY): POC Glucose: 112 mg/dl — AB (ref 70–99)

## 2020-05-23 MED ORDER — NOVOLOG FLEXPEN 100 UNIT/ML ~~LOC~~ SOPN: 28.0000 [IU] | PEN_INJECTOR | Freq: Three times a day (TID) | SUBCUTANEOUS | 11 refills | Status: DC

## 2020-05-23 MED ORDER — LEVETIRACETAM ER 500 MG PO TB24: 500.0000 mg | ORAL_TABLET | Freq: Every day | ORAL | 4 refills | Status: DC

## 2020-05-23 NOTE — Progress Notes (Signed)
[facility] [address] [place] [id number] [phone]       OFFICE FOLLOW UP VISIT NOTE  Mr. Albert LeavensJames A Garcia Date of Birth:  24-Oct-1971 Medical Record Number:  [id number]   Referring MD: Albert Matareborah B Garcia Reason for Referral:  seizure  GBT:[id number]:Initial visit  03/02/2018: Mr. Albert Garcia is a 49 year old male who states he's had history of seizures since teenage years.he describes both staring spells as well as generalized conversions in the past and had tried phenobarbital, Depakote, carbamazepine and Dilantin had been seizure-free for more than 8 years until 2 weeks ago. He states he had an aura and deja vu  like feeling with lightheadedness. He had to sit down. He lost awareness of surroundings and fell asleep. He woke up and out and half later and felt quite tired and weak all over and this feeling lasted for 4-6 hours. The patient was previously taking Dilantin 300 mg daily which he has taken for the last couple of the kids. His Dilantin level was checked and found to be 4.5 and his primary care physician increase the Dilantin dose to 300 mg twice daily. He had a follow-up level on 02/24/18 which was 6.5. Patient denies any signs or Dilantin toxicity in the form of nausea, dizziness, gait ataxia and diplopia. Patient was seen previously on 12/27/15 by Dr. Clydie BraunKaren endocrine no and advised to continue Dilantin 300 mg daily at that visit. The patient was previously found to have low vitamin D levels but he has been on replacement and most recent vitamin D level has been supratherapeutic. The patient has no family history of epilepsy and denies any history of perinatal infection, injury, trauma, febrile seizures. He denies drug abuse or heavy alcohol intake.e states he sleeps well every night and denies irregular sleeping or eating habits. He works with chemicals and operates heavy machinery.Review of electronic medical records shows that he had an MRI scan of the brain done in 2001 which was  unremarkable and a CT scan of the head in 2014 which was also normal. He has had no EEG is documented in the medical records recently.  Interval History: 06/02/2018:Patient is being seen today for follow-up.  He continues to take Dilantin 300 mg daily along with Keppra XR 500 mg daily.  He denies recent seizure activity and overall has been doing well.  He has been tolerating medications well without side effects.  He continues not to drive.  He has not returned to work at this time as he works with chemicals and operates heavy machinery.  He did undergo EEG monitoring which was negative for seizure activity.  Patient returns today for reevaluation.  Update 05/23/2020: He returns for follow-up after last visit 2 years ago.  He continues to do well has not had recurrent seizures or even auras 2 years and longer.  Remains on Keppra XR 5 mg daily which is tolerating well without side effects.  His primary care physician tapered and discontinued Dilantin instead put him on gabapentin 300 mg at night which he takes only as needed.  He has been compliant with his medicines.  He is having no side effects.  He recently saw her primary care physician who did lab work on 05/11/2020 and BMP was significant only for elevated glucose of 233.  White count was slightly low at 3.8 and hematocrit of 36.5.  He had Keppra level checked this morning and result is still awaited.  ROS:   14 system review of systems is  positive for appetite change and snoring and all other systems negative  PMH:  Past Medical History:  Diagnosis Date  . Diabetes mellitus   . Erectile dysfunction   . Headache   . Hypertension   . Seizures (HCC)     Social History:  Social History   Socioeconomic History  . Marital status: Divorced    Spouse name: Not on file  . Number of children: 3  . Years of education: Not on file  . Highest education level: Not on file  Occupational History  . Occupation: Midwife: [employer]  Tobacco Use  . Smoking status: Never Smoker  . Smokeless tobacco: Never Used  Vaping Use  . Vaping Use: Never used  Substance and Sexual Activity  . Alcohol use: Yes    Alcohol/week: 0.0 standard drinks    Comment: occ  . Drug use: No  . Sexual activity: Not on file  Other Topics Concern  . Not on file  Social History Narrative  . Not on file   Social Determinants of Health   Financial Resource Strain:   . Difficulty of Paying Living Expenses:   Food Insecurity:   . Worried About Programme researcher, broadcasting/film/video in the Last Year:   . Barista in the Last Year:   Transportation Needs:   . Freight forwarder (Medical):   Marland Kitchen Lack of Transportation (Non-Medical):   Physical Activity:   . Days of Exercise per Week:   . Minutes of Exercise per Session:   Stress:   . Feeling of Stress :   Social Connections:   . Frequency of Communication with Friends and Family:   . Frequency of Social Gatherings with Friends and Family:   . Attends Religious Services:   . Active Member of Clubs or Organizations:   . Attends Banker Meetings:   Marland Kitchen Marital Status:   Intimate Partner Violence:   . Fear of Current or Ex-Partner:   . Emotionally Abused:   Marland Kitchen Physically Abused:   . Sexually Abused:     Medications:   Current Outpatient Medications on File Prior to Visit  Medication Sig Dispense Refill  . amLODipine (NORVASC) 10 MG tablet Take 1 tablet (10 mg total) by mouth daily. 90 tablet 3  . aspirin EC 81 MG tablet Take 1 tablet (81 mg total) by mouth daily. 100 tablet 1  . atorvastatin (LIPITOR) 20 MG tablet Take 1 tablet (20 mg total) by mouth daily. 90 tablet 3  . cholecalciferol (VITAMIN D-400) 10 MCG (400 UNIT) TABS tablet Take 1 tablet (400 Units total) by mouth daily. 100 tablet 0  . gabapentin (NEURONTIN) 300 MG capsule Take 1 capsule (300 mg total) by mouth at bedtime. 30 capsule 11  . GARLIC PO Take 1 capsule by mouth daily.    Marland Kitchen glucose blood test strip  Use as instructed 3 times daily 100 each 2  . ibuprofen (ADVIL) 600 MG tablet Take 1 tablet (600 mg total) by mouth every 6 (six) hours as needed for moderate pain. 30 tablet 0  . Insulin Glargine (BASAGLAR KWIKPEN) 100 UNIT/ML Inject 0.54 mLs (54 Units total) into the skin at bedtime. 5 pen 6  . Insulin Pen Needle (B-D UF III MINI PEN NEEDLES) 31G X 5 MM MISC USE AS DIRECTED 100 each 2  . lisinopril (ZESTRIL) 10 MG tablet Take 1 tablet (10 mg total) by mouth daily. 90 tablet 3  . Misc Natural Products (APPLE  CIDER VINEGAR DIET PO) Take 1 capsule by mouth daily.    . Multiple Vitamin (MULTIVITAMIN WITH MINERALS) TABS tablet Take 1 tablet by mouth daily. Reported on 11/13/2015    . ONETOUCH DELICA LANCETS 33G MISC 1 each by Does not apply route 3 (three) times daily. 100 each 2  . SUPER B COMPLEX/C PO Take 1 capsule by mouth daily.    . [DISCONTINUED] omeprazole (PRILOSEC) 20 MG capsule TAKE 1 CAPSULE (20 MG TOTAL) BY MOUTH DAILY. (Patient not taking: Reported on 05/11/2020) 30 capsule 2  . [DISCONTINUED] promethazine (PHENERGAN) 12.5 MG tablet Take 1 tablet (12.5 mg total) by mouth daily as needed for nausea or vomiting. (Patient not taking: Reported on 05/11/2020) 30 tablet 0  . [DISCONTINUED] sildenafil (VIAGRA) 50 MG tablet Take 0.5-1 tablets (25-50 mg total) by mouth daily as needed for erectile dysfunction. (Patient not taking: Reported on 05/11/2020) 10 tablet 6  . [DISCONTINUED] sitaGLIPtin-metformin (JANUMET) 50-1000 MG tablet Take 1 tablet by mouth 2 (two) times daily with a meal. (Patient not taking: Reported on 05/11/2020) 180 tablet 3   No current facility-administered medications on file prior to visit.    Allergies:   Allergies  Allergen Reactions  . Hctz [Hydrochlorothiazide]     Causes ED     Physical Exam General: well developed, well nourished middle-age male, seated, in no evident distress Head: head normocephalic and atraumatic.   Neck: supple with no carotid or  supraclavicular bruits Cardiovascular: regular rate and rhythm, no murmurs Musculoskeletal: no deformity Skin:  no rash/petichiae Vascular:  Normal pulses all extremities  Neurologic Exam Mental Status: Awake and fully alert. Oriented to place and time. Recent and remote memory intact. Attention span, concentration and fund of knowledge appropriate. Mood and affect appropriate.  Cranial Nerves: Fundoscopic exam reveals sharp disc margins. Pupils equal, briskly reactive to light. Extraocular movements full without nystagmus. Visual fields full to confrontation. Hearing intact. Facial sensation intact. Face, tongue, palate moves normally and symmetrically.  Motor: Normal bulk and tone. Normal strength in all tested extremity muscles. Sensory.: intact to touch , pinprick , position and vibratory sensation.  Coordination: Rapid alternating movements normal in all extremities. Finger-to-nose and heel-to-shin performed accurately bilaterally. Gait and Station: Arises from chair without difficulty. Stance is normal. Gait demonstrates normal stride length and balance . Able to heel, toe and tandem walk without difficulty.  Reflexes: 1+ and symmetric. Toes downgoing.     IMAGING/STUDIES:  MR Brain WO contrast 03/31/18 IMPRESSION: Slightly abnormal MRI scan of the brain showing mild nonspecific periventricular white matter hyperintensities with the differential discussed above.  A small area of focal calcification of the falx is noted anteriorly in the midline which may represent benign calcification versus small calcified meningioma  EEG 05/07/18 Summary Normal electroencephalogram, awake, asleep and with activation procedures. There are no focal lateralizing or epileptiform features.    ASSESSMENT: 49 year old male with long-standing history of seizures since teenage years who was doing well on Dilantin and had been seizure-free for more than 7 years with recent episode of aura and possible  complex partial seizure in April 2019.Marland Kitchen  Patient returns today for follow-up visit and overall is doing well without recent seizure activity.    PLAN: I had a long discussion with the patient regarding his seizure disorder which appears to be quite well controlled on the current medication regimen of Keppra XR 500 mg daily and he has been seizure-free now for more than a few years.  I recommend he continue the present  regimen and avoid seizure provoking stimuli like medication noncompliance, sleep deprivation, extremes of exertion and activity.  He was given a refill for Keppra for a year and will return for follow-up in 1 year or call earlier if necessary  Greater than 50% time during this 30 minute   visit was spent on counseling and coordination of care about seizures and answered questions.  Delia Heady, MD [facility] [address] [place], [place] [address]  Phone [phone] Fax [phone] Note: This document was prepared with digital dictation and possible smart phrase technology. Any transcriptional errors that result from this process are unintentional.

## 2020-05-23 NOTE — Patient Instructions (Addendum)
I had a long discussion with the patient regarding his seizure disorder which appears to be quite well controlled on the current medication regimen of Keppra XR 500 mg daily and he has been seizure-free now for more than a few years.  I recommend he continue the present regimen and avoid seizure provoking stimuli like medication noncompliance, sleep deprivation, extremes of exertion and activity.  He was given a refill for Keppra for a year and will return for follow-up in 1 year or call earlier if necessary.

## 2020-05-23 NOTE — Progress Notes (Signed)
[place], [place]  [address] Phone:  [phone]   Fax:  [phone]    New Patient Office Visit  Subjective:  Patient ID: Albert Garcia, male    DOB: [birth year]  Age: 49 y.o. MRN: [id number]  CC:  Chief Complaint  Patient presents with  . Establish Care    changing pcp to [place]  . Diabetes  . Hypertension    HPI Albert Garcia presents to establish care. He  has a past medical history of Diabetes mellitus, Erectile dysfunction, Headache, Hypertension, and Seizures ([place]).   Diabetes Mellitus Patient presents for follow up of diabetes. Current symptoms include: heartburn and tingling in right hand. Hx of partheisia in feet but this has improved with the gabapentin. Symptoms have progressed to a point and plateaued. Patient denies foot ulcerations, hypoglycemia , increased appetite, nausea, polydipsia, polyuria, visual disturbances, vomiting and weight loss. Evaluation to date has included: fasting blood sugar, fasting lipid panel, hemoglobin A1C and microalbuminuria.  Home sugars: BGs are high in the morning, BGs are high in the evening, BGs are high at bedtime. Current treatment: Continued insulin which has been not very effective, Continued statin which has been somewhat effective and Continued ACE inhibitor/ARB which has been somewhat effective. Last dilated eye exam: hx of laser surgery he is going to get a second opinion. Left eye.  Hypertension Patient is here for follow-up of elevated blood pressure. He is not exercising due to working long hours 6-7 days per week and is not adherent to a low-salt diet. Blood pressure is not monitiored at home. Cardiac symptoms: none. Patient denies chest pain, dyspnea, fatigue, irregular heart beat, lower extremity edema, orthopnea and syncope. Cardiovascular risk factors: diabetes mellitus, dyslipidemia, hypertension, male gender, obesity (BMI >= 30 kg/m2) and sedentary lifestyle. Use of agents  associated with hypertension: none. History of target organ damage: retinopathy.  Past Medical History:  Diagnosis Date  . Diabetes mellitus   . Erectile dysfunction   . Headache   . Hypertension   . Seizures ([place])     Past Surgical History:  Procedure Laterality Date  . NO PAST SURGERIES    . REFRACTIVE SURGERY Bilateral 04/2018    Family History  Problem Relation Age of Onset  . Hypertension Mother   . Diabetes Mother   . Lupus Mother   . Kidney failure Mother   . Diabetes Father   . Hypertension Father   . Kidney failure Father     Social History   Socioeconomic History  . Marital status: Divorced    Spouse name: Not on file  . Number of children: 3  . Years of education: Not on file  . Highest education level: Not on file  Occupational History  . Occupation: Arts development officer: [employer]  Tobacco Use  . Smoking status: Never Smoker  . Smokeless tobacco: Never Used  Vaping Use  . Vaping Use: Never used  Substance and Sexual Activity  . Alcohol use: Yes    Alcohol/week: 0.0 standard drinks    Comment: occ  . Drug use: No  . Sexual activity: Not on file  Other Topics Concern  . Not on file  Social History Narrative  . Not on file   Social Determinants of Health   Financial Resource Strain:   . Difficulty of Paying Living Expenses:   Food Insecurity:   . Worried About Charity fundraiser in the Last Year:   .  Ran Out of Food in the Last Year:   Transportation Needs:   . Film/video editor (Medical):   Marland Kitchen Lack of Transportation (Non-Medical):   Physical Activity:   . Days of Exercise per Week:   . Minutes of Exercise per Session:   Stress:   . Feeling of Stress :   Social Connections:   . Frequency of Communication with Friends and Family:   . Frequency of Social Gatherings with Friends and Family:   . Attends Religious Services:   . Active Member of Clubs or Organizations:   . Attends Archivist Meetings:   Marland Kitchen  Marital Status:   Intimate Partner Violence:   . Fear of Current or Ex-Partner:   . Emotionally Abused:   Marland Kitchen Physically Abused:   . Sexually Abused:     ROS Review of Systems  Constitutional: Negative.   HENT: Negative.   Eyes: Positive for visual disturbance.  Respiratory: Negative.   Cardiovascular: Negative.   Gastrointestinal: Negative.   Endocrine: Negative.   Genitourinary: Negative.   Musculoskeletal: Negative.   Skin: Negative.   Allergic/Immunologic: Negative.   Neurological: Negative.   Hematological: Negative.   Psychiatric/Behavioral: Negative.     Objective:   Today's Vitals: BP (!) 136/87 (BP Location: Right Arm, Patient Position: Sitting, Cuff Size: Large)   Pulse 87   Temp 98.7 F (37.1 C) (Oral)   Resp 16   Ht '5\' 8"'  (1.727 m)   Wt (!) 261 lb (118.4 kg)   SpO2 99%   BMI 39.68 kg/m   Physical Exam Constitutional:      General: He is not in acute distress.    Appearance: He is obese. He is not ill-appearing, toxic-appearing or diaphoretic.  HENT:     Head: Normocephalic and atraumatic.     Nose: Nose normal.     Mouth/Throat:     Mouth: Mucous membranes are moist.  Cardiovascular:     Rate and Rhythm: Normal rate and regular rhythm.     Pulses: Normal pulses.     Heart sounds: Normal heart sounds.  Pulmonary:     Effort: Pulmonary effort is normal.     Breath sounds: Normal breath sounds.  Abdominal:     General: Bowel sounds are normal.     Palpations: Abdomen is soft.  Musculoskeletal:        General: Normal range of motion.     Cervical back: Normal range of motion.  Skin:    General: Skin is warm and dry.     Capillary Refill: Capillary refill takes less than 2 seconds.  Neurological:     General: No focal deficit present.     Mental Status: He is alert and oriented to person, place, and time.  Psychiatric:        Mood and Affect: Mood normal.        Behavior: Behavior normal.        Thought Content: Thought content normal.         Judgment: Judgment normal.     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 (HCC) Encourage compliance with current treatment regimen  Dose adjustment novolog restarted at 28 untis TID Encourage regular CBG monitoring Encourage contacting office if excessive hyperglycemia and or hypoglycemia Lifestyle modification with healthy diet (fewer calories, more high fiber foods, whole grains and non-starchy vegetables, lower fat meat and fish, low-fat diary include healthy oils) regular exercise (physical activity) and weight loss Opthalmology exam discussed  Nutritional consult recommended Regular dental visits encouraged Home BP monitoring also encouraged goal <130/80     Relevant Medications   insulin aspart (NOVOLOG FLEXPEN) 100 UNIT/ML FlexPen   Microalbuminuric diabetic nephropathy (HCC) (Chronic)   Relevant Medications   insulin aspart (NOVOLOG FLEXPEN) 100 UNIT/ML FlexPen     Nervous and Auditory   Seizure disorder (HCC) (Chronic) Continue with current regimen  Follow up with neurology today   Relevant Orders   Levetiracetam level    Other Visit Diagnoses    Type 2 diabetes mellitus with diabetic polyneuropathy, with long-term current use of insulin (HCC)    -  Primary   Relevant Medications   insulin aspart (NOVOLOG FLEXPEN) 100 UNIT/ML FlexPen   Other Relevant Orders   Urinalysis Dipstick (Completed)   Glucose (CBG) (Completed)   Ambulatory referral to diabetic education   Gastroesophageal reflux disease  without esophagitis          Outpatient Encounter Medications as of 05/23/2020  Medication Sig  . amLODipine (NORVASC) 10 MG tablet Take 1 tablet (10 mg total) by mouth daily.  Marland Kitchen aspirin EC 81 MG tablet Take 1 tablet (81 mg total) by mouth daily.  Marland Kitchen atorvastatin (LIPITOR) 20 MG tablet Take 1 tablet (20 mg total) by mouth daily.  . cholecalciferol (VITAMIN D-400) 10 MCG (400 UNIT) TABS tablet Take 1 tablet (400 Units total) by mouth daily.  Marland Kitchen gabapentin (NEURONTIN) 300 MG capsule Take 1 capsule (300 mg total) by mouth at bedtime.  Marland Kitchen GARLIC PO Take 1 capsule by mouth daily.  Marland Kitchen glucose blood test strip Use as instructed 3 times daily  . ibuprofen (ADVIL) 600 MG tablet Take 1 tablet (600 mg total) by mouth every 6 (six) hours as needed for moderate pain.  . Insulin Glargine (BASAGLAR KWIKPEN) 100 UNIT/ML Inject 0.54 mLs (54 Units total) into the skin at bedtime.  . Insulin Pen Needle (B-D UF III MINI PEN NEEDLES) 31G X 5 MM MISC USE AS DIRECTED  . lisinopril (ZESTRIL) 10 MG tablet Take 1 tablet (10 mg total) by mouth daily.  . Misc Natural Products (APPLE CIDER VINEGAR DIET PO) Take 1 capsule by mouth daily.  . Multiple Vitamin (MULTIVITAMIN WITH MINERALS) TABS tablet Take 1 tablet by mouth daily. Reported on 11/13/2015  . [DISCONTINUED] Blood Glucose Monitoring Suppl (ONE TOUCH ULTRA 2) w/Device KIT Use 3 times daily  . [DISCONTINUED] levETIRAcetam (KEPPRA XR) 500 MG 24 hr tablet Take 1 tablet (500 mg total) by mouth daily.  . Continuous Blood Gluc Receiver (FREESTYLE LIBRE 14 DAY READER) DEVI 1 Device by Does not apply route continuous.  . Continuous Blood Gluc Sensor (FREESTYLE LIBRE 14 DAY SENSOR) MISC 1 each by Does not apply route continuous.  . insulin aspart (NOVOLOG FLEXPEN) 100 UNIT/ML FlexPen Inject 28 Units into the skin 3 (three) times daily with meals.  Glory Rosebush DELICA LANCETS 36U MISC 1 each by Does not apply route 3 (three) times daily.  . SUPER B COMPLEX/C PO Take 1 capsule by  mouth daily.  . [DISCONTINUED] insulin lispro (HUMALOG KWIKPEN) 100  UNIT/ML KwikPen Inject 0.27 mLs (27 Units total) into the skin 3 (three) times daily. (Patient not taking: Reported on 05/23/2020)  . [DISCONTINUED] omeprazole (PRILOSEC) 20 MG capsule TAKE 1 CAPSULE (20 MG TOTAL) BY MOUTH DAILY. (Patient not taking: Reported on 05/11/2020)  . [DISCONTINUED] promethazine (PHENERGAN) 12.5 MG tablet Take 1 tablet (12.5 mg total) by mouth daily as needed for nausea or vomiting. (Patient not taking: Reported on 05/11/2020)  . [DISCONTINUED] sildenafil (VIAGRA) 50 MG tablet Take 0.5-1 tablets (25-50 mg total) by mouth daily as needed for erectile dysfunction. (Patient not taking: Reported on 05/11/2020)  . [DISCONTINUED] sitaGLIPtin-metformin (JANUMET) 50-1000 MG tablet Take 1 tablet by mouth 2 (two) times daily with a meal. (Patient not taking: Reported on 05/11/2020)   No facility-administered encounter medications on file as of 05/23/2020.    Follow-up: Return in about 4 weeks (around 06/20/2020) for allow patient to give you his availablity thanks .   Vevelyn Francois, NP

## 2020-05-23 NOTE — Patient Instructions (Signed)
Liraglutide injection What is this medicine? LIRAGLUTIDE (LIR a GLOO tide) is used to improve blood sugar control in adults with type 2 diabetes. This medicine may be used with other diabetes medicines. This drug may also reduce the risk of heart attack or stroke if you have type 2 diabetes and risk factors for heart disease. This medicine may be used for other purposes; ask your health care provider or pharmacist if you have questions. COMMON BRAND NAME(S): Victoza What should I tell my health care provider before I take this medicine? They need to know if you have any of these conditions:  endocrine tumors (MEN 2) or if someone in your family had these tumors  gallbladder disease  high cholesterol  history of alcohol abuse problem  history of pancreatitis  kidney disease or if you are on dialysis  liver disease  previous swelling of the tongue, face, or lips with difficulty breathing, difficulty swallowing, hoarseness, or tightening of the throat  stomach problems  thyroid cancer or if someone in your family had thyroid cancer  an unusual or allergic reaction to liraglutide, other medicines, foods, dyes, or preservatives  pregnant or trying to get pregnant  breast-feeding How should I use this medicine? This medicine is for injection under the skin of your upper leg, stomach area, or upper arm. You will be taught how to prepare and give this medicine. Use exactly as directed. Take your medicine at regular intervals. Do not take it more often than directed. It is important that you put your used needles and syringes in a special sharps container. Do not put them in a trash can. If you do not have a sharps container, call your pharmacist or healthcare provider to get one [place] will be given to you by the pharmacist with each prescription and refill. Be sure to read this information carefully each time. This drug comes with INSTRUCTIONS FOR USE. Ask your pharmacist  for directions on how to use this drug. Read the information carefully. Talk to your pharmacist or health care provider if you have questions. Talk to your pediatrician regarding the use of this medicine in children. While this drug may be prescribed for children as young as 28 years of age, precautions do apply. Overdosage: If you think you have taken too much of this medicine contact a poison control center or emergency room at once. NOTE: This medicine is only for you. Do not share this medicine with others. What if I miss a dose? If you miss a dose, take it as soon as you can. If it is almost time for your next dose, take only that dose. Do not take double or extra doses. What may interact with this medicine?  other medicines for diabetes Many medications may cause changes in blood sugar, these include:  alcohol containing beverages  antiviral medicines for HIV or AIDS  aspirin and aspirin-like drugs  certain medicines for blood pressure, heart disease, irregular heart beat  chromium  diuretics  male hormones, such as estrogens or progestins, birth control pills  fenofibrate  gemfibrozil  isoniazid  lanreotide  male hormones or anabolic steroids  MAOIs like Carbex, Eldepryl, Marplan, Nardil, and Parnate  medicines for weight loss  medicines for allergies, asthma, cold, or cough  medicines for depression, anxiety, or psychotic disturbances  niacin  nicotine  NSAIDs, medicines for pain and inflammation, like ibuprofen or naproxen  octreotide  pasireotide  pentamidine  phenytoin  probenecid  quinolone antibiotics such as ciprofloxacin, levofloxacin,  ofloxacin  some herbal dietary supplements  steroid medicines such as prednisone or cortisone  sulfamethoxazole; trimethoprim  thyroid hormones Some medications can hide the warning symptoms of low blood sugar (hypoglycemia). You may need to monitor your blood sugar more closely if you are taking one  of these medications. These include:  beta-blockers, often used for high blood pressure or heart problems (examples include atenolol, metoprolol, propranolol)  clonidine  guanethidine  reserpine This list may not describe all possible interactions. Give your health care provider a list of all the medicines, herbs, non-prescription drugs, or dietary supplements you use. Also tell them if you smoke, drink alcohol, or use illegal drugs. Some items may interact with your medicine. What should I watch for while using this medicine? Visit your doctor or health care professional for regular checks on your progress. Drink plenty of fluids while taking this medicine. Check with your doctor or health care professional if you get an attack of severe diarrhea, nausea, and vomiting. The loss of too much body fluid can make it dangerous for you to take this medicine. A test called the HbA1C (A1C) will be monitored. This is a simple blood test. It measures your blood sugar control over the last 2 to 3 months. You will receive this test every 3 to 6 months. Learn how to check your blood sugar. Learn the symptoms of low and high blood sugar and how to manage them. Always carry a quick-source of sugar with you in case you have symptoms of low blood sugar. Examples include hard sugar candy or glucose tablets. Make sure others know that you can choke if you eat or drink when you develop serious symptoms of low blood sugar, such as seizures or unconsciousness. They must get medical help at once. Tell your doctor or health care professional if you have high blood sugar. You might need to change the dose of your medicine. If you are sick or exercising more than usual, you might need to change the dose of your medicine. Do not skip meals. Ask your doctor or health care professional if you should avoid alcohol. Many nonprescription cough and cold products contain sugar or alcohol. These can affect blood sugar. Pens should  never be shared. Even if the needle is changed, sharing may result in passing of viruses like hepatitis or HIV. Wear a medical ID bracelet or chain, and carry a card that describes your disease and details of your medicine and dosage times. What side effects may I notice from receiving this medicine? Side effects that you should report to your doctor or health care professional as soon as possible:  allergic reactions like skin rash, itching or hives, swelling of the face, lips, or tongue  breathing problems  diarrhea that continues or is severe  lump or swelling on the neck  severe nausea  signs and symptoms of infection like fever or chills; cough; sore throat; pain or trouble passing urine  signs and symptoms of low blood sugar such as feeling anxious, confusion, dizziness, increased hunger, unusually weak or tired, sweating, shakiness, cold, irritable, headache, blurred vision, fast heartbeat, loss of consciousness  signs and symptoms of kidney injury like trouble passing urine or change in the amount of urine  trouble swallowing  unusual stomach upset or pain  vomiting Side effects that usually do not require medical attention (report to your doctor or health care professional if they continue or are bothersome):  constipation  decreased appetite  diarrhea  fatigue  headache  nausea  pain, redness, or irritation at site where injected  stomach upset  stuffy or runny nose This list may not describe all possible side effects. Call your doctor for medical advice about side effects. You may report side effects to FDA at 1-800-FDA-1088. Where should I keep my medicine? Keep out of the reach of children. Store unopened pen in a refrigerator between 2 and 8 degrees C (36 and 46 degrees F). Do not freeze or use if the medicine has been frozen. Protect from light and excessive heat. After you first use the pen, it can be stored at room temperature between 15 and 30 degrees  C (59 and 86 degrees F) or in a refrigerator. Throw away your used pen after 30 days or after the expiration date, whichever comes first. Do not store your pen with the needle attached. If the needle is left on, medicine may leak from the pen. NOTE: This sheet is a summary. It may not cover all possible information. If you have questions about this medicine, talk to your doctor, pharmacist, or health care provider.  2020 Elsevier/Gold Standard (2019-06-28 09:39:47)  

## 2020-05-24 MED ORDER — FREESTYLE LIBRE 14 DAY READER DEVI: 1.0000 | 1 refills | Status: AC

## 2020-05-24 MED ORDER — FREESTYLE LIBRE 14 DAY SENSOR MISC: 1.0000 | 11 refills | Status: DC

## 2020-05-25 LAB — LEVETIRACETAM LEVEL: Levetiracetam Lvl: 1.4 ug/mL — ABNORMAL LOW (ref 10.0–40.0)

## 2020-05-30 ENCOUNTER — Encounter (INDEPENDENT_AMBULATORY_CARE_PROVIDER_SITE_OTHER): Payer: BC Managed Care – PPO | Admitting: Ophthalmology

## 2020-06-02 ENCOUNTER — Ambulatory Visit (HOSPITAL_BASED_OUTPATIENT_CLINIC_OR_DEPARTMENT_OTHER): Payer: BC Managed Care – PPO | Attending: Internal Medicine | Admitting: Internal Medicine

## 2020-06-04 ENCOUNTER — Other Ambulatory Visit: Payer: Self-pay

## 2020-06-04 ENCOUNTER — Ambulatory Visit: Payer: BC Managed Care – PPO | Admitting: Endocrinology

## 2020-06-04 DIAGNOSIS — Z20822 Contact with and (suspected) exposure to covid-19: Secondary | ICD-10-CM | POA: Diagnosis not present

## 2020-06-04 MED ORDER — FREESTYLE LIBRE 14 DAY SENSOR MISC: 1.0000 | 11 refills | Status: DC

## 2020-06-06 DIAGNOSIS — Z20822 Contact with and (suspected) exposure to covid-19: Secondary | ICD-10-CM | POA: Diagnosis not present

## 2020-06-15 ENCOUNTER — Ambulatory Visit (INDEPENDENT_AMBULATORY_CARE_PROVIDER_SITE_OTHER): Payer: BC Managed Care – PPO | Admitting: Nurse Practitioner

## 2020-06-15 ENCOUNTER — Encounter: Payer: Self-pay | Admitting: Nurse Practitioner

## 2020-06-15 ENCOUNTER — Other Ambulatory Visit: Payer: Self-pay

## 2020-06-15 VITALS — BP 132/77 | HR 90 | Temp 97.7°F | Ht 68.0 in | Wt 261.0 lb

## 2020-06-15 DIAGNOSIS — Z113 Encounter for screening for infections with a predominantly sexual mode of transmission: Secondary | ICD-10-CM

## 2020-06-15 DIAGNOSIS — E1142 Type 2 diabetes mellitus with diabetic polyneuropathy: Secondary | ICD-10-CM

## 2020-06-15 DIAGNOSIS — I10 Essential (primary) hypertension: Secondary | ICD-10-CM | POA: Diagnosis not present

## 2020-06-15 DIAGNOSIS — E113591 Type 2 diabetes mellitus with proliferative diabetic retinopathy without macular edema, right eye: Secondary | ICD-10-CM | POA: Diagnosis not present

## 2020-06-15 DIAGNOSIS — N529 Male erectile dysfunction, unspecified: Secondary | ICD-10-CM | POA: Diagnosis not present

## 2020-06-15 DIAGNOSIS — E113512 Type 2 diabetes mellitus with proliferative diabetic retinopathy with macular edema, left eye: Secondary | ICD-10-CM | POA: Diagnosis not present

## 2020-06-15 DIAGNOSIS — Z794 Long term (current) use of insulin: Secondary | ICD-10-CM

## 2020-06-15 LAB — POCT URINALYSIS DIPSTICK
Bilirubin, UA: NEGATIVE
Blood, UA: NEGATIVE
Glucose, UA: POSITIVE — AB
Ketones, UA: NEGATIVE
Leukocytes, UA: NEGATIVE
Nitrite, UA: NEGATIVE
Protein, UA: POSITIVE — AB
Spec Grav, UA: >=1.03 — AB (ref 1.010–1.025)
Urobilinogen, UA: 0.2 E.U./dL
pH, UA: 5.5 (ref 5.0–8.0)

## 2020-06-15 NOTE — Progress Notes (Signed)
[facility] Patient [facility] [address] [place], [place]  [address] Phone:  [phone]   Fax:  [phone]   Established Patient Office Visit  Subjective:  Patient ID: Albert Garcia, male    DOB: [birth year]  Age: 49 y.o. MRN: [id number]  CC:  Chief Complaint  Patient presents with   Follow-up    discuss up coming eye surgery, has COVID questions.  wants to talk about possible STD testing    HPI Albert Garcia presents for follow up. He  has a past medical history of Diabetes mellitus, Erectile dysfunction, Headache, Hypertension, and Seizures (HCC).   Diabetes Mellitus Patient presents for follow up of diabetes. Current symptoms include: hypoglycemia , paresthesia of the feet and visual disturbances. He admits that he had to leave work because he had taken his Novolog andhad not eaten. CBG dropped to 40.  His  Symptoms have stabilized. Patient denies foot ulcerations, increased appetite, nausea, polydipsia, polyuria and vomiting. Evaluation to date has included: fasting blood sugar, fasting lipid panel, hemoglobin A1C and microalbuminuria.  Home sugars: symptomatic hypoglycemia occurs times one. He is doing better; he has been home for one week due to his job being concern that his not feeling well was COV-19 related. . Current treatment: more intensive attention to diet which has been effective, Continued insulin which has been effective and Continued statin which has been effective. Last dilated eye exam: 2021. He is scheduled for Lasik eye surgery in the next few weeks..   Past Medical History:  Diagnosis Date   Diabetes mellitus    Erectile dysfunction    Headache    Hypertension    Seizures (HCC)     Past Surgical History:  Procedure Laterality Date   NO PAST SURGERIES     REFRACTIVE SURGERY Bilateral 04/2018    Family History  Problem Relation Age of Onset   Hypertension Mother    Diabetes Mother    Lupus Mother    Kidney failure Mother     Diabetes Father    Hypertension Father    Kidney failure Father     Social History   Socioeconomic History   Marital status: Divorced    Spouse name: Not on file   Number of children: 3   Years of education: Not on file   Highest education level: Not on file  Occupational History   Occupation: Midwife: [employer]  Tobacco Use   Smoking status: Never Smoker   Smokeless tobacco: Never Used  Building services engineer Use: Never used  Substance and Sexual Activity   Alcohol use: Yes    Alcohol/week: 0.0 standard drinks    Comment: occ   Drug use: No   Sexual activity: Not on file  Other Topics Concern   Not on file  Social History Narrative   Not on file   Social Determinants of Health   Financial Resource Strain:    Difficulty of Paying Living Expenses: Not on file  Food Insecurity:    Worried About Running Out of Food in the Last Year: Not on file   Ran Out of Food in the Last Year: Not on file  Transportation Needs:    Lack of Transportation (Medical): Not on file   Lack of Transportation (Non-Medical): Not on file  Physical Activity:    Days of Exercise per Week: Not on file   Minutes of Exercise per Session: Not on file  Stress:  Feeling of Stress : Not on file  Social Connections:    Frequency of Communication with Friends and Family: Not on file   Frequency of Social Gatherings with Friends and Family: Not on file   Attends Religious Services: Not on file   Active Member of Clubs or Organizations: Not on file   Attends Banker Meetings: Not on file   Marital Status: Not on file  Intimate Partner Violence:    Fear of Current or Ex-Partner: Not on file   Emotionally Abused: Not on file   Physically Abused: Not on file   Sexually Abused: Not on file    Outpatient Medications Prior to Visit  Medication Sig Dispense Refill   amLODipine (NORVASC) 10 MG tablet Take 1 tablet (10 mg total) by  mouth daily. 90 tablet 3   aspirin EC 81 MG tablet Take 1 tablet (81 mg total) by mouth daily. 100 tablet 1   atorvastatin (LIPITOR) 20 MG tablet Take 1 tablet (20 mg total) by mouth daily. 90 tablet 3   cholecalciferol (VITAMIN D-400) 10 MCG (400 UNIT) TABS tablet Take 1 tablet (400 Units total) by mouth daily. 100 tablet 0   Continuous Blood Gluc Receiver (FREESTYLE LIBRE 14 DAY READER) DEVI 1 Device by Does not apply route continuous. 1 each 1   Continuous Blood Gluc Sensor (FREESTYLE LIBRE 14 DAY SENSOR) MISC 1 each by Does not apply route continuous. 1 each 11   gabapentin (NEURONTIN) 300 MG capsule Take 1 capsule (300 mg total) by mouth at bedtime. 30 capsule 11   GARLIC PO Take 1 capsule by mouth daily.     glucose blood test strip Use as instructed 3 times daily 100 each 2   ibuprofen (ADVIL) 600 MG tablet Take 1 tablet (600 mg total) by mouth every 6 (six) hours as needed for moderate pain. 30 tablet 0   insulin aspart (NOVOLOG FLEXPEN) 100 UNIT/ML FlexPen Inject 28 Units into the skin 3 (three) times daily with meals. 15 mL 11   Insulin Glargine (BASAGLAR KWIKPEN) 100 UNIT/ML Inject 0.54 mLs (54 Units total) into the skin at bedtime. 5 pen 6   Insulin Pen Needle (B-D UF III MINI PEN NEEDLES) 31G X 5 MM MISC USE AS DIRECTED 100 each 2   levETIRAcetam (KEPPRA XR) 500 MG 24 hr tablet Take 1 tablet (500 mg total) by mouth daily. 90 tablet 4   lisinopril (ZESTRIL) 10 MG tablet Take 1 tablet (10 mg total) by mouth daily. 90 tablet 3   Misc Natural Products (APPLE CIDER VINEGAR DIET PO) Take 1 capsule by mouth daily.     Multiple Vitamin (MULTIVITAMIN WITH MINERALS) TABS tablet Take 1 tablet by mouth daily. Reported on 11/13/2015     [facility] DELICA LANCETS 33G MISC 1 each by Does not apply route 3 (three) times daily. 100 each 2   SUPER B COMPLEX/C PO Take 1 capsule by mouth daily.     No facility-administered medications prior to visit.    Allergies  Allergen Reactions     Hctz [Hydrochlorothiazide]     Causes ED     ROS Review of Systems    Objective:    Physical Exam  BP 132/77    Pulse 90    Temp 97.7 F (36.5 C)    Ht 5\' 8"  (1.727 m)    Wt 261 lb (118.4 kg)    SpO2 100%    BMI 39.68 kg/m  Wt Readings from Last 3 Encounters:  06/15/20  261 lb (118.4 kg)  05/23/20 (!) 264 lb (119.7 kg)  05/23/20 (!) 261 lb (118.4 kg)     Health Maintenance Due  Topic Date Due   INFLUENZA VACCINE  05/27/2020    There are no preventive care reminders to display for this patient.  Lab Results  Component Value Date   TSH 4.090 02/18/2018   Lab Results  Component Value Date   WBC 3.8 (L) 05/11/2020   HGB 12.9 (L) 05/11/2020   HCT 36.5 (L) 05/11/2020   MCV 92.4 05/11/2020   PLT 252 05/11/2020   Lab Results  Component Value Date   NA 137 05/11/2020   K 3.7 05/11/2020   CO2 29 05/11/2020   GLUCOSE 233 (H) 05/11/2020   BUN 14 05/11/2020   CREATININE 0.83 05/11/2020   BILITOT 0.4 04/16/2020   ALKPHOS 73 04/16/2020   AST 25 04/16/2020   ALT 45 (H) 04/16/2020   PROT 7.6 04/16/2020   ALBUMIN 4.2 04/16/2020   CALCIUM 9.1 05/11/2020   ANIONGAP 8 05/11/2020   Lab Results  Component Value Date   CHOL 211 (H) 04/16/2020   Lab Results  Component Value Date   HDL 47 04/16/2020   Lab Results  Component Value Date   LDLCALC 134 (H) 04/16/2020   Lab Results  Component Value Date   TRIG 169 (H) 04/16/2020   Lab Results  Component Value Date   CHOLHDL 4.5 04/16/2020   Lab Results  Component Value Date   HGBA1C 11.6 (A) 04/16/2020      Assessment & Plan:   Problem List Items Addressed This Visit      Cardiovascular and Mediastinum   HTN (hypertension) (Chronic) Encouraged on going compliance with current medication regimen Encouraged home monitoring and recording BP <130/80 Eating a heart-healthy diet with less salt Encouraged regular physical activity  Recommend Weight loss      Other   Erectile dysfunction    Other Visit  Diagnoses    Type 2 diabetes mellitus with diabetic polyneuropathy, with long-term current use of insulin (HCC)    -  Primary Encourage compliance with current treatment regimen  Encourage regular CBG monitoring Encourage contacting office if excessive hyperglycemia and or hypoglycemia Lifestyle modification with healthy diet (fewer calories, more high fiber foods, whole grains and non-starchy vegetables, lower fat meat and fish, low-fat diary include healthy oils) regular exercise (physical activity) and weight loss Nutritional consult recommended Regular dental visits encouraged Home BP monitoring also encouraged goal <130/80     Relevant Orders   POCT urinalysis dipstick (Completed)   Screening examination for STD (sexually transmitted disease)     His significant other was recently diagnosed with vaginosis. Long discussion about vaginosis   Relevant Orders   Chlamydia/Gonococcus/Trichomonas, NAA   Urine Culture      No orders of the defined types were placed in this encounter.   Follow-up: Return in about 2 months (around 08/15/2020).    Barbette Merino, NP

## 2020-06-15 NOTE — Patient Instructions (Addendum)
Core Strength Exercises  Core exercises help to build strength in the muscles between your ribs and your hips (abdominal muscles). These muscles help to support your body and keep your spine stable. It is important to maintain strength in your core to prevent injury and pain. Some activities, such as yoga and Pilates, can help to strengthen core muscles. You can also strengthen core muscles with exercises at home. It is important to talk to your health care provider before you start a new exercise routine. What are the benefits of core strength exercises? Core strength exercises can:  Reduce back pain.  Help to rebuild strength after a back or spine injury.  Help to prevent injury during physical activity, especially injuries to the back and knees. How to do core strength exercises Repeat these exercises 10-15 times, or until you are tired. Do exercises exactly as told by your health care provider and adjust them as directed. It is normal to feel mild stretching, pulling, tightness, or discomfort as you do these exercises. If you feel any pain while doing these exercises, stop. If your pain continues or gets worse when doing core exercises, contact your health care provider. You may want to use a padded yoga or exercise mat for strength exercises that are done on the floor. Bridging  1. Lie on your back on a firm surface with your knees bent and your feet flat on the floor. 2. Raise your hips so that your knees, hips, and shoulders form a straight line together. Keep your abdominal muscles tight. 3. Hold this position for 3-5 seconds. 4. Slowly lower your hips to the starting position. 5. Let your muscles relax completely between repetitions. Single-leg bridge 1. Lie on your back on a firm surface with your knees bent and your feet flat on the floor. 2. Raise your hips so that your knees, hips, and shoulders form a straight line together. Keep your abdominal muscles tight. 3. Lift one foot  off the floor, then completely straighten that leg. 4. Hold this position for 3-5 seconds. 5. Put the straight leg back down in the bent position. 6. Slowly lower your hips to the starting position. 7. Repeat these steps using your other leg. Side bridge 1. Lie on your side with your knees bent. Prop yourself up on the elbow that is near the floor. 2. Using your abdominal muscles and your elbow that is on the floor, raise your body off the floor. Raise your hip so that your shoulder, hip, and foot form a straight line together. 3. Hold this position for 10 seconds. Keep your head and neck raised and away from your shoulder (in their normal, neutral position). Keep your abdominal muscles tight. 4. Slowly lower your hip to the starting position. 5. Repeat and try to hold this position longer, working your way up to 30 seconds. Abdominal crunch 1. Lie on your back on a firm surface. Bend your knees and keep your feet flat on the floor. 2. Cross your arms over your chest. 3. Without bending your neck, tip your chin slightly toward your chest. 4. Tighten your abdominal muscles as you lift your chest just high enough to lift your shoulder blades off of the floor. Do not hold your breath. You can do this with short lifts or long lifts. 5. Slowly return to the starting position. Bird dog 1. Get on your hands and knees, with your legs shoulder-width apart and your arms under your shoulders. Keep your back straight. 2. Tighten   your abdominal muscles. 3. Raise one of your legs off the floor and straighten it. Try to keep it parallel to the floor. 4. Slowly lower your leg to the starting position. 5. Raise one of your arms off the floor and straighten it. Try to keep it parallel to the floor. 6. Slowly lower your arm to the starting position. 7. Repeat with the other arm and leg. If possible, try raising a leg and arm at the same time, on opposite sides of the body. For example, raise your left hand and  your right leg. Plank 1. Lie on your belly. 2. Prop up your body onto your forearms and your feet, keeping your legs straight. Your body should make a straight line between your shoulders and feet. 3. Hold this position for 10 seconds while keeping your abdominal muscles tight. 4. Lower your body to the starting position. 5. Repeat and try to hold this position longer, working your way up to 30 seconds. Cross-core strengthening 1. Stand with your feet shoulder-width apart. 2. Hold a ball out in front of you. Keep your arms straight. 3. Tighten your abdominal muscles and slowly rotate at your waist from side to side. Keep your feet flat. 4. Once you are comfortable, try repeating this exercise with a heavier ball. Top core strengthening 1. Stand about 18 inches (46 cm) in front of a wall, with your back to the wall. 2. Keep your feet flat and shoulder-width apart. 3. Tighten your abdominal muscles. 4. Bend your hips and knees. 5. Slowly reach between your legs to touch the wall behind you. 6. Slowly stand back up. 7. Raise your arms over your head and reach behind you. 8. Return to the starting position. General tips  Do not do any exercises that cause pain. If you have pain while exercising, talk to your health care provider.  Always stretch before and after doing these exercises. This can help prevent injury.  Maintain a healthy weight. Ask your health care provider what weight is healthy for you. Contact a health care provider if:  You have back pain that gets worse or does not go away.  You feel pain while doing core strength exercises. Get help right away if:  You have severe pain that does not get better with medicine. Summary  Core exercises help to build strength in the muscles between your ribs and your waist.  Core muscles help to support your body and keep your spine stable.  Some activities, such as yoga and Pilates, can help to strengthen core muscles.  Core  strength exercises can help back pain and can prevent injury.  If you feel any pain while doing core strength exercises, stop. This information is not intended to replace advice given to you by your health care provider. Make sure you discuss any questions you have with your health care provider. Document Revised: 02/02/2019 Document Reviewed: 03/04/2017 Elsevier Patient Education  [employer].  a

## 2020-06-16 ENCOUNTER — Encounter: Payer: Self-pay | Admitting: Nurse Practitioner

## 2020-06-17 LAB — URINE CULTURE

## 2020-06-19 LAB — CHLAMYDIA/GONOCOCCUS/TRICHOMONAS, NAA
Chlamydia by NAA: NEGATIVE
Gonococcus by NAA: NEGATIVE
Trich vag by NAA: NEGATIVE

## 2020-06-27 ENCOUNTER — Ambulatory Visit: Payer: BC Managed Care – PPO | Admitting: Endocrinology

## 2020-07-09 ENCOUNTER — Other Ambulatory Visit: Payer: Self-pay

## 2020-07-09 ENCOUNTER — Encounter (INDEPENDENT_AMBULATORY_CARE_PROVIDER_SITE_OTHER): Payer: BC Managed Care – PPO | Admitting: Ophthalmology

## 2020-07-10 ENCOUNTER — Ambulatory Visit: Payer: BC Managed Care – PPO | Admitting: Skilled Nursing Facility1

## 2020-07-17 ENCOUNTER — Ambulatory Visit: Payer: BC Managed Care – PPO | Admitting: Internal Medicine

## 2020-07-17 ENCOUNTER — Ambulatory Visit: Payer: BC Managed Care – PPO | Admitting: Pharmacist

## 2020-07-27 ENCOUNTER — Telehealth: Payer: Self-pay | Admitting: Nurse Practitioner

## 2020-07-27 NOTE — Telephone Encounter (Signed)
Called pt to inform him that his FMLA paperwork was faxed & ready for pick up. lvm

## 2020-08-02 ENCOUNTER — Encounter (INDEPENDENT_AMBULATORY_CARE_PROVIDER_SITE_OTHER): Payer: Self-pay | Admitting: Ophthalmology

## 2020-08-02 ENCOUNTER — Ambulatory Visit (INDEPENDENT_AMBULATORY_CARE_PROVIDER_SITE_OTHER): Payer: BC Managed Care – PPO | Admitting: Ophthalmology

## 2020-08-02 ENCOUNTER — Other Ambulatory Visit: Payer: Self-pay

## 2020-08-02 DIAGNOSIS — E113591 Type 2 diabetes mellitus with proliferative diabetic retinopathy without macular edema, right eye: Secondary | ICD-10-CM

## 2020-08-02 DIAGNOSIS — E11311 Type 2 diabetes mellitus with unspecified diabetic retinopathy with macular edema: Secondary | ICD-10-CM | POA: Diagnosis not present

## 2020-08-02 DIAGNOSIS — E113592 Type 2 diabetes mellitus with proliferative diabetic retinopathy without macular edema, left eye: Secondary | ICD-10-CM | POA: Diagnosis not present

## 2020-08-02 DIAGNOSIS — Z794 Long term (current) use of insulin: Secondary | ICD-10-CM | POA: Diagnosis not present

## 2020-08-02 NOTE — Patient Instructions (Signed)
And asked to report any new onset floaters or flashes in either eye in the coming weeks.  Assured him that should this occur, he that he is not required to like it but not to be worried about it.

## 2020-08-02 NOTE — Progress Notes (Addendum)
Between 5 and 7:00 was treated twice due to medial opacity   08/02/2020     CHIEF COMPLAINT Patient presents for Retina Follow Up   HISTORY OF PRESENT ILLNESS: Albert Garcia is a 49 y.o. male who presents to the clinic today for:   HPI    Retina Follow Up    Patient presents with  Diabetic Retinopathy.  In both eyes.  Severity is moderate.  Duration of 2 years.  Since onset it is stable.  I, the attending physician,  performed the HPI with the patient and updated documentation appropriately.          Comments    2 Year Diabetic Exam OU. OCT Pt referred back for diabetic retinopathy by Dr. Sharlot Garcia.  Pt states vision has been stable. Pt c/o OS being lazy, states it is hard to keep open. Denies FOL and floaters. BGL: did not check A1C: 11 (2 mos ago)       Last edited by Albert Garcia on 08/02/2020  8:12 AM. (History)      Referring physician: Barbette Merino, NP [address] [place],  [place] [address]  HISTORICAL INFORMATION:   Selected notes from the MEDICAL RECORD NUMBER    Lab Results  Component Value Date   HGBA1C 11.6 (A) 04/16/2020     CURRENT MEDICATIONS: No current outpatient medications on file. (Ophthalmic Drugs)   No current facility-administered medications for this visit. (Ophthalmic Drugs)   Current Outpatient Medications (Other)  Medication Sig  . amLODipine (NORVASC) 10 MG tablet Take 1 tablet (10 mg total) by mouth daily.  Marland Kitchen aspirin EC 81 MG tablet Take 1 tablet (81 mg total) by mouth daily.  Marland Kitchen atorvastatin (LIPITOR) 20 MG tablet Take 1 tablet (20 mg total) by mouth daily.  . cholecalciferol (VITAMIN D-400) 10 MCG (400 UNIT) TABS tablet Take 1 tablet (400 Units total) by mouth daily.  . Continuous Blood Gluc Receiver (FREESTYLE LIBRE 14 DAY READER) DEVI 1 Device by Does not apply route continuous.  . Continuous Blood Gluc Sensor (FREESTYLE LIBRE 14 DAY SENSOR) MISC 1 each by Does not apply route continuous.  Marland Kitchen gabapentin (NEURONTIN) 300 MG  capsule Take 1 capsule (300 mg total) by mouth at bedtime.  Marland Kitchen GARLIC PO Take 1 capsule by mouth daily.  Marland Kitchen glucose blood test strip Use as instructed 3 times daily  . ibuprofen (ADVIL) 600 MG tablet Take 1 tablet (600 mg total) by mouth every 6 (six) hours as needed for moderate pain.  Marland Kitchen insulin aspart (NOVOLOG FLEXPEN) 100 UNIT/ML FlexPen Inject 28 Units into the skin 3 (three) times daily with meals.  . Insulin Glargine (BASAGLAR KWIKPEN) 100 UNIT/ML Inject 0.54 mLs (54 Units total) into the skin at bedtime.  . Insulin Pen Needle (B-D UF III MINI PEN NEEDLES) 31G X 5 MM MISC USE AS DIRECTED  . levETIRAcetam (KEPPRA XR) 500 MG 24 hr tablet Take 1 tablet (500 mg total) by mouth daily.  Marland Kitchen lisinopril (ZESTRIL) 10 MG tablet Take 1 tablet (10 mg total) by mouth daily.  . Misc Natural Products (APPLE CIDER VINEGAR DIET PO) Take 1 capsule by mouth daily.  . Multiple Vitamin (MULTIVITAMIN WITH MINERALS) TABS tablet Take 1 tablet by mouth daily. Reported on 11/13/2015  . ONETOUCH DELICA LANCETS 33G MISC 1 each by Does not apply route 3 (three) times daily.  . SUPER B COMPLEX/C PO Take 1 capsule by mouth daily.   No current facility-administered medications for this visit. (Other)  REVIEW OF SYSTEMS: ROS    Positive for: Endocrine   Last edited by Albert Jarvislayton, Albert Garcia on 08/02/2020  8:12 AM. (History)       ALLERGIES Allergies  Allergen Reactions  . Hctz [Hydrochlorothiazide]     Causes ED     PAST MEDICAL HISTORY Past Medical History:  Diagnosis Date  . Diabetes mellitus   . Erectile dysfunction   . Headache   . Hypertension   . Seizures (HCC)    Past Surgical History:  Procedure Laterality Date  . NO PAST SURGERIES    . REFRACTIVE SURGERY Bilateral 04/2018    FAMILY HISTORY Family History  Problem Relation Age of Onset  . Hypertension Mother   . Diabetes Mother   . Lupus Mother   . Kidney failure Mother   . Diabetes Father   . Hypertension Father   . Kidney failure  Father     SOCIAL HISTORY Social History   Tobacco Use  . Smoking status: Never Smoker  . Smokeless tobacco: Never Used  Vaping Use  . Vaping Use: Never used  Substance Use Topics  . Alcohol use: Yes    Alcohol/week: 0.0 standard drinks    Comment: occ  . Drug use: No         OPHTHALMIC EXAM:  Base Eye Exam    Visual Acuity (Snellen - Linear)      Right Left   Dist Gerber 20/25 -2 20/25 -1       Pupils      Pupils Dark Light Shape React APD   Right PERRL 3 3 Round Minimal None   Left PERRL 3 3 Round Minimal None       Visual Fields (Counting fingers)      Left Right    Full Full       Neuro/Psych    Oriented x3: Yes   Mood/Affect: Normal        Slit Lamp and Fundus Exam    External Exam      Right Left   External Normal Normal       Slit Lamp Exam      Right Left   Lids/Lashes Normal Normal   Conjunctiva/Sclera White and quiet White and quiet   Cornea Clear Clear   Anterior Chamber Deep and quiet Deep and quiet   Iris Round and reactive Neovascularization, nearly 360.  Pupillary   Lens 2+ Nuclear sclerosis 2+ Nuclear sclerosis   Anterior Vitreous Normal Normal       Fundus Exam      Right Left   Posterior Vitreous Normal Normal   Disc Normal Neovascularization greater than 10-c   C/D Ratio 0.4 0.4   Macula Microaneurysms, no clinically significant macular edema Microaneurysms, no clinically significant macular edema   Vessels PDR-active PDR-active   Periphery Neovascularization,   moderate scatter PRP nasally, room for PRP temporally Neovascularization, with moderate scatter superonasally, room temporally for PRP          IMAGING AND PROCEDURES  Imaging and Procedures for 08/02/20  OCT, Retina - OU - Both Eyes       Right Eye Quality was good. Scan locations included subfoveal. Central Foveal Thickness: 256. Progression has been stable. Findings include normal foveal contour.   Left Eye Quality was good. Scan locations included  subfoveal. Central Foveal Thickness: 262. Progression has been stable. Findings include normal foveal contour.   Notes Minor para macular changes with CME yet not CSME will observe, OD  Panretinal Photocoagulation - OS - Left Eye       Time Out Confirmed correct patient, procedure, site, and patient consented.   Anesthesia Topical anesthesia was used. Anesthetic medications included Proparacaine 0.5%.   Laser Information The type of laser was diode. Color was yellow. The duration in seconds was 0.02. The spot size was 309 microns. Laser power was 360. Total spots was 2874.   Post-op The patient tolerated the procedure well. There were no complications. The patient received written and verbal post procedure care education.   Notes PRP completed nearly 360 degrees.  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      ICD-10-CM   1. Type 2 diabetes mellitus with left eye affected by proliferative retinopathy without macular edema, with long-term current use of insulin (HCC)  Z61.0960 Panretinal Photocoagulation - OS - Left Eye   Z79.4   2. Diabetic retinopathy of both eyes with macular edema associated with type 2 diabetes mellitus, unspecified retinopathy severity (HCC)  E11.311 OCT, Retina - OU - Both Eyes  3. Type 2 diabetes mellitus with right eye affected by proliferative retinopathy without macular edema, with long-term current use of insulin (HCC)  E11.3591 Panretinal Photocoagulation - OS - Left Eye   Z79.4     1.  I disclosed the patient on the importance of permanency of delivering panretinal photocoagulation of fairly aggressive fashion today left eye.  I also explained the critical importance of rendering the permanency of  panretinal photocoagulation to quiet the iris neovascularization and the neovascularization of the optic nerve.  I explained the patient that as the neovascularization with there is, regresses, vitreous hemorrhage can develop in the floaters there and are not from the treatment but from the disease as it proves.  Also explained that permanent vision loss from diabetic vitreous hemorrhage is not possible.  Progressive blood vessel growth particularly of the iris poses an imminent danger to loss of the eye and vision  2.  Understand regarding his issues.  We will commence with PRP left eye today.  3.FINDINGS TODAY OF ANTERIOR SEGMENT NEOVASCULARIZATION OF IRIS, rubeosis, mandates urgent delivery of permanent PRP,  To address the peripheral nonperfusion OS.  Ophthalmic Meds Ordered this visit:  No orders of the defined types were placed in this encounter.      Return in about 3 weeks (around 08/23/2020) for dilate, COLOR FP, OD, PRP.  Patient Instructions  And asked to report any new onset floaters or flashes in either eye in the coming weeks.  Assured him that should this occur, he that he is not required to like it but not to be worried about it.    Explained the diagnoses, plan, and follow up with the patient and they expressed understanding.  Patient expressed understanding of the importance of proper follow up care.   Alford Highland Emili Mcloughlin Garcia.D. Diseases & Surgery of the Retina and Vitreous [facility] 08/02/20     Abbreviations: Garcia myopia (nearsighted); A astigmatism; H hyperopia (farsighted); P presbyopia; Mrx spectacle prescription;  CTL contact lenses; OD right eye; OS left eye; OU both eyes  XT exotropia; ET esotropia; PEK punctate epithelial keratitis; PEE punctate epithelial erosions; DES dry eye syndrome; MGD meibomian gland dysfunction;  ATs artificial tears; PFAT's preservative free artificial tears; NSC nuclear sclerotic cataract; PSC posterior subcapsular cataract;  ERM epi-retinal membrane; PVD posterior vitreous detachment; RD retinal detachment; DM diabetes mellitus; DR diabetic retinopathy; NPDR non-proliferative diabetic retinopathy; PDR proliferative diabetic retinopathy; CSME clinically significant macular edema; DME diabetic macular edema; dbh dot blot hemorrhages; CWS cotton wool spot; POAG primary open angle glaucoma; C/D cup-to-disc ratio; HVF humphrey visual field; GVF goldmann visual field; OCT optical coherence tomography; IOP intraocular pressure; BRVO Branch retinal vein occlusion; CRVO central retinal vein occlusion; CRAO central retinal artery occlusion; BRAO branch retinal artery occlusion; RT retinal tear; SB scleral buckle; PPV pars plana vitrectomy; VH Vitreous hemorrhage; PRP panretinal laser photocoagulation; IVK intravitreal kenalog; VMT vitreomacular traction; MH Macular hole;  NVD neovascularization of the disc; NVE neovascularization elsewhere; AREDS age related eye disease study; ARMD age related macular degeneration; POAG primary open angle glaucoma; EBMD epithelial/anterior basement membrane dystrophy; ACIOL anterior chamber intraocular lens; IOL intraocular lens; PCIOL posterior chamber intraocular lens; Phaco/IOL phacoemulsification with intraocular lens placement; PRK photorefractive keratectomy; LASIK laser assisted in situ keratomileusis; HTN hypertension; DM diabetes mellitus; COPD chronic obstructive pulmonary disease

## 2020-08-02 NOTE — Assessment & Plan Note (Signed)
With iris neovascularization, needs urgent PRP for permanency of improvement.

## 2020-08-15 ENCOUNTER — Ambulatory Visit: Payer: Self-pay | Admitting: Nurse Practitioner

## 2020-08-23 ENCOUNTER — Ambulatory Visit (INDEPENDENT_AMBULATORY_CARE_PROVIDER_SITE_OTHER): Payer: BC Managed Care – PPO | Admitting: Nurse Practitioner

## 2020-08-23 ENCOUNTER — Encounter: Payer: Self-pay | Admitting: Nurse Practitioner

## 2020-08-23 ENCOUNTER — Other Ambulatory Visit: Payer: Self-pay

## 2020-08-23 VITALS — BP 135/73 | HR 88 | Temp 98.8°F | Resp 17 | Ht 68.0 in | Wt 267.6 lb

## 2020-08-23 DIAGNOSIS — Z794 Long term (current) use of insulin: Secondary | ICD-10-CM

## 2020-08-23 DIAGNOSIS — E1142 Type 2 diabetes mellitus with diabetic polyneuropathy: Secondary | ICD-10-CM

## 2020-08-23 DIAGNOSIS — I10 Essential (primary) hypertension: Secondary | ICD-10-CM | POA: Diagnosis not present

## 2020-08-23 LAB — POCT GLYCOSYLATED HEMOGLOBIN (HGB A1C)
HbA1c POC (<> result, manual entry): 9.6 % (ref 4.0–5.6)
HbA1c, POC (controlled diabetic range): 9.6 % — AB (ref 0.0–7.0)
HbA1c, POC (prediabetic range): 9.6 % — AB (ref 5.7–6.4)
Hemoglobin A1C: 9.6 % — AB (ref 4.0–5.6)

## 2020-08-23 LAB — GLUCOSE, POCT (MANUAL RESULT ENTRY): POC Glucose: 132 mg/dl — AB (ref 70–99)

## 2020-08-23 MED ORDER — KETOROLAC TROMETHAMINE 60 MG/2ML IM SOLN: 60.0000 mg | Freq: Once | INTRAMUSCULAR | Status: DC

## 2020-08-23 MED ORDER — FREESTYLE LIBRE 14 DAY SENSOR MISC: 1.0000 | 3 refills | Status: AC

## 2020-08-23 MED ORDER — NOVOLOG FLEXPEN 100 UNIT/ML ~~LOC~~ SOPN: 28.0000 [IU] | PEN_INJECTOR | Freq: Three times a day (TID) | SUBCUTANEOUS | 11 refills | Status: DC

## 2020-08-23 MED ORDER — GABAPENTIN 300 MG PO CAPS: 300.0000 mg | ORAL_CAPSULE | Freq: Every day | ORAL | 3 refills | Status: DC

## 2020-08-23 NOTE — Progress Notes (Signed)
[facility] [address] [place], [place]  [address] Phone:  [phone]   Fax:  [phone]   Established Patient Office Visit  Subjective:  Patient ID: Albert Garcia, male    DOB: 10/26/1971  Age: 49 y.o. MRN: [id number]  CC:  Chief Complaint  Patient presents with   Follow-up    Pt states he has concerns about his insulin.    HPI Albert Garcia presents for follow up. He  has a past medical history of Diabetes mellitus, Erectile dysfunction, Headache, Hypertension, and Seizures (HCC).   Diabetes Mellitus Patient presents for follow up of diabetes. Current symptoms include: hyperglycemia, paresthesia of the feet and visual disturbances. Symptoms have stabilized. Patient denies foot ulcerations, hypoglycemia , increased appetite, nausea, polydipsia, polyuria, vomiting and weight loss. Evaluation to date has included: fasting blood sugar, fasting lipid panel, hemoglobin A1C and microalbuminuria.  Home sugars: BGs are running  consistent with Hgb A1C. Current treatment: Continued insulin which has been somewhat effective, Continued statin which has been somewhat effective and Continued ACE inhibitor/ARB which has been effective.he is very concern because the price of his insulin has gone up. He could not afford what was asked. He has been off the novolog for a few days.  Last dilated eye exam: 2021. He is SP argon laser eye surgery.  Past Medical History:  Diagnosis Date   Diabetes mellitus    Erectile dysfunction    Headache    Hypertension    Seizures (HCC)     Past Surgical History:  Procedure Laterality Date   NO PAST SURGERIES     REFRACTIVE SURGERY Bilateral 04/2018    Family History  Problem Relation Age of Onset   Hypertension Mother    Diabetes Mother    Lupus Mother    Kidney failure Mother    Diabetes Father    Hypertension Father    Kidney failure Father     Social History   Socioeconomic History   Marital status:  Divorced    Spouse name: Not on file   Number of children: 3   Years of education: Not on file   Highest education level: Not on file  Occupational History   Occupation: Midwife: [employer]  Tobacco Use   Smoking status: Never Smoker   Smokeless tobacco: Never Used  Building services engineer Use: Never used  Substance and Sexual Activity   Alcohol use: Yes    Alcohol/week: 0.0 standard drinks    Comment: occ   Drug use: No   Sexual activity: Not on file  Other Topics Concern   Not on file  Social History Narrative   Not on file   Social Determinants of Health   Financial Resource Strain:    Difficulty of Paying Living Expenses: Not on file  Food Insecurity:    Worried About Running Out of Food in the Last Year: Not on file   Ran Out of Food in the Last Year: Not on file  Transportation Needs:    Lack of Transportation (Medical): Not on file   Lack of Transportation (Non-Medical): Not on file  Physical Activity:    Days of Exercise per Week: Not on file   Minutes of Exercise per Session: Not on file  Stress:    Feeling of Stress : Not on file  Social Connections:    Frequency of Communication with Friends and Family: Not on file   Frequency of Social  Gatherings with Friends and Family: Not on file   Attends Religious Services: Not on file   Active Member of Clubs or Organizations: Not on file   Attends Banker Meetings: Not on file   Marital Status: Not on file  Intimate Partner Violence:    Fear of Current or Ex-Partner: Not on file   Emotionally Abused: Not on file   Physically Abused: Not on file   Sexually Abused: Not on file    Outpatient Medications Prior to Visit  Medication Sig Dispense Refill   amLODipine (NORVASC) 10 MG tablet Take 1 tablet (10 mg total) by mouth daily. 90 tablet 3   aspirin EC 81 MG tablet Take 1 tablet (81 mg total) by mouth daily. 100 tablet 1   atorvastatin (LIPITOR)  20 MG tablet Take 1 tablet (20 mg total) by mouth daily. 90 tablet 3   cholecalciferol (VITAMIN D-400) 10 MCG (400 UNIT) TABS tablet Take 1 tablet (400 Units total) by mouth daily. 100 tablet 0   Continuous Blood Gluc Receiver (FREESTYLE LIBRE 14 DAY READER) DEVI 1 Device by Does not apply route continuous. 1 each 1   glucose blood test strip Use as instructed 3 times daily 100 each 2   ibuprofen (ADVIL) 600 MG tablet Take 1 tablet (600 mg total) by mouth every 6 (six) hours as needed for moderate pain. 30 tablet 0   Insulin Glargine (BASAGLAR KWIKPEN) 100 UNIT/ML Inject 0.54 mLs (54 Units total) into the skin at bedtime. 5 pen 6   Insulin Pen Needle (B-D UF III MINI PEN NEEDLES) 31G X 5 MM MISC USE AS DIRECTED 100 each 2   levETIRAcetam (KEPPRA XR) 500 MG 24 hr tablet Take 1 tablet (500 mg total) by mouth daily. 90 tablet 4   lisinopril (ZESTRIL) 10 MG tablet Take 1 tablet (10 mg total) by mouth daily. 90 tablet 3   Multiple Vitamin (MULTIVITAMIN WITH MINERALS) TABS tablet Take 1 tablet by mouth daily. Reported on 11/13/2015     [facility] DELICA LANCETS 33G MISC 1 each by Does not apply route 3 (three) times daily. 100 each 2   SUPER B COMPLEX/C PO Take 1 capsule by mouth daily.     Continuous Blood Gluc Sensor (FREESTYLE LIBRE 14 DAY SENSOR) MISC 1 each by Does not apply route continuous. 1 each 11   gabapentin (NEURONTIN) 300 MG capsule Take 1 capsule (300 mg total) by mouth at bedtime. 30 capsule 11   GARLIC PO Take 1 capsule by mouth daily. (Patient not taking: Reported on 08/23/2020)     Misc Natural Products (APPLE CIDER VINEGAR DIET PO) Take 1 capsule by mouth daily. (Patient not taking: Reported on 08/23/2020)     insulin aspart (NOVOLOG FLEXPEN) 100 UNIT/ML FlexPen Inject 28 Units into the skin 3 (three) times daily with meals. (Patient not taking: Reported on 08/23/2020) 15 mL 11   No facility-administered medications prior to visit.    Allergies  Allergen Reactions    Hctz [Hydrochlorothiazide]     Causes ED     ROS Review of Systems    Objective:    Physical Exam Constitutional:      Appearance: He is obese.  HENT:     Head: Atraumatic.     Right Ear: Tympanic membrane normal.     Nose: Nose normal.  Cardiovascular:     Rate and Rhythm: Normal rate and regular rhythm.     Pulses: Normal pulses.     Heart sounds: Normal heart  sounds.  Pulmonary:     Effort: Pulmonary effort is normal.     Breath sounds: Normal breath sounds.  Abdominal:     General: Bowel sounds are normal.     Palpations: Abdomen is soft.  Musculoskeletal:        General: Normal range of motion.     Cervical back: Normal range of motion.  Skin:    General: Skin is warm and dry.     Capillary Refill: Capillary refill takes less than 2 seconds.  Neurological:     General: No focal deficit present.     Mental Status: He is alert and oriented to person, place, and time.  Psychiatric:        Mood and Affect: Mood normal.        Behavior: Behavior normal.        Thought Content: Thought content normal.        Judgment: Judgment normal.     BP 135/73 (BP Location: Left Arm, Patient Position: Sitting, Cuff Size: Large)    Pulse 88    Temp 98.8 F (37.1 C)    Resp 17    Wt 267 lb 9.6 oz (121.4 kg)    SpO2 98%    BMI 40.69 kg/m  Wt Readings from Last 3 Encounters:  08/23/20 267 lb 9.6 oz (121.4 kg)  06/15/20 261 lb (118.4 kg)  05/23/20 (!) 264 lb (119.7 kg)     There are no preventive care reminders to display for this patient.  There are no preventive care reminders to display for this patient.  Lab Results  Component Value Date   TSH 4.090 02/18/2018   Lab Results  Component Value Date   WBC 3.8 (L) 05/11/2020   HGB 12.9 (L) 05/11/2020   HCT 36.5 (L) 05/11/2020   MCV 92.4 05/11/2020   PLT 252 05/11/2020   Lab Results  Component Value Date   NA 137 05/11/2020   K 3.7 05/11/2020   CO2 29 05/11/2020   GLUCOSE 233 (H) 05/11/2020   BUN 14 05/11/2020     CREATININE 0.83 05/11/2020   BILITOT 0.4 04/16/2020   ALKPHOS 73 04/16/2020   AST 25 04/16/2020   ALT 45 (H) 04/16/2020   PROT 7.6 04/16/2020   ALBUMIN 4.2 04/16/2020   CALCIUM 9.1 05/11/2020   ANIONGAP 8 05/11/2020   Lab Results  Component Value Date   CHOL 211 (H) 04/16/2020   Lab Results  Component Value Date   HDL 47 04/16/2020   Lab Results  Component Value Date   LDLCALC 134 (H) 04/16/2020   Lab Results  Component Value Date   TRIG 169 (H) 04/16/2020   Lab Results  Component Value Date   CHOLHDL 4.5 04/16/2020   Lab Results  Component Value Date   HGBA1C 9.6 (A) 08/23/2020   HGBA1C 9.6 08/23/2020   HGBA1C 9.6 (A) 08/23/2020   HGBA1C 9.6 (A) 08/23/2020      Assessment & Plan:   Problem List Items Addressed This Visit    None    Visit Diagnoses    Type 2 diabetes mellitus with diabetic polyneuropathy, with long-term current use of insulin (HCC)    -  Primary Encourage compliance with current treatment regimen . Discussed ways to save on his novolog.  Encourage regular CBG monitoring Encourage contacting office if excessive hyperglycemia and or hypoglycemia Lifestyle modification with healthy diet (fewer calories, more high fiber foods, whole grains and non-starchy vegetables, lower fat meat and fish, low-fat diary include  healthy oils) regular exercise (physical activity) and weight loss Opthalmology exam discussed  Nutritional consult recommended Regular dental visits encouraged Home BP monitoring also encouraged goal <130/80   Relevant Medications   insulin aspart (NOVOLOG FLEXPEN) 100 UNIT/ML FlexPen   gabapentin (NEURONTIN) 300 MG capsule   Other Relevant Orders   POC Glucose (CBG) (Completed)   POC HgB A1c (Completed)   Essential hypertension Encouraged on going compliance with current medication regimen Encouraged home monitoring and recording BP <130/80 Eating a heart-healthy diet with less salt Encouraged regular physical activity   Recommend Weight loss         Meds ordered this encounter  Medications   insulin aspart (NOVOLOG FLEXPEN) 100 UNIT/ML FlexPen    Sig: Inject 28 Units into the skin 3 (three) times daily with meals.    Dispense:  15 mL    Refill:  11    Order Specific Question:   Supervising Provider    Answer:   Quentin Angst [[id number]]   gabapentin (NEURONTIN) 300 MG capsule    Sig: Take 1 capsule (300 mg total) by mouth at bedtime.    Dispense:  90 capsule    Refill:  3    Order Specific Question:   Supervising Provider    Answer:   Quentin Angst [[id number]]   Continuous Blood Gluc Sensor (FREESTYLE LIBRE 14 DAY SENSOR) MISC    Sig: 1 each by Does not apply route continuous.    Dispense:  6 each    Refill:  3    Order Specific Question:   Supervising Provider    Answer:   Quentin Angst [id number]       Follow-up: Return in about 3 months (around 11/23/2020).    Barbette Merino, NP

## 2020-08-23 NOTE — Patient Instructions (Signed)

## 2020-08-28 ENCOUNTER — Other Ambulatory Visit: Payer: Self-pay

## 2020-08-28 ENCOUNTER — Encounter (INDEPENDENT_AMBULATORY_CARE_PROVIDER_SITE_OTHER): Payer: Self-pay | Admitting: Ophthalmology

## 2020-08-28 ENCOUNTER — Ambulatory Visit (INDEPENDENT_AMBULATORY_CARE_PROVIDER_SITE_OTHER): Payer: BC Managed Care – PPO | Admitting: Ophthalmology

## 2020-08-28 DIAGNOSIS — H211X2 Other vascular disorders of iris and ciliary body, left eye: Secondary | ICD-10-CM | POA: Insufficient documentation

## 2020-08-28 DIAGNOSIS — E113592 Type 2 diabetes mellitus with proliferative diabetic retinopathy without macular edema, left eye: Secondary | ICD-10-CM | POA: Diagnosis not present

## 2020-08-28 DIAGNOSIS — Z794 Long term (current) use of insulin: Secondary | ICD-10-CM

## 2020-08-28 DIAGNOSIS — E113591 Type 2 diabetes mellitus with proliferative diabetic retinopathy without macular edema, right eye: Secondary | ICD-10-CM

## 2020-08-28 DIAGNOSIS — H02402 Unspecified ptosis of left eyelid: Secondary | ICD-10-CM | POA: Diagnosis not present

## 2020-08-28 NOTE — Assessment & Plan Note (Signed)
Active PDR OD today will require additional PRP evaluation examination to monitor condition in 8 weeks

## 2020-08-28 NOTE — Assessment & Plan Note (Signed)
Recent PRP OS has improved the iris neovascularization and stabilize the proliferative diabetic retinopathy

## 2020-08-28 NOTE — Assessment & Plan Note (Signed)
This condition is improving status post PRP 1 month previous left

## 2020-08-28 NOTE — Progress Notes (Signed)
08/28/2020     CHIEF COMPLAINT Patient presents for Retina Follow Up   HISTORY OF PRESENT ILLNESS: Albert Garcia is a 49 y.o. male who presents to the clinic today for:   HPI    Retina Follow Up    Patient presents with  Diabetic Retinopathy.  In right eye.  Severity is moderate.  Duration of 4 weeks.  Since onset it is stable.  I, the attending physician,  performed the HPI with the patient and updated documentation appropriately.          Comments    4 Week PDR f\u OD. PRP OD. FP  Pt states no changes in vision. Denies any new complaints. BGL: did not check       Last edited by Elyse Jarvis on 08/28/2020  8:42 AM. (History)      Referring physician: Barbette Merino, NP [address] [place],  [place] [address]  HISTORICAL INFORMATION:   Selected notes from the MEDICAL RECORD NUMBER    Lab Results  Component Value Date   HGBA1C 9.6 (A) 08/23/2020   HGBA1C 9.6 08/23/2020   HGBA1C 9.6 (A) 08/23/2020   HGBA1C 9.6 (A) 08/23/2020     CURRENT MEDICATIONS: No current outpatient medications on file. (Ophthalmic Drugs)   No current facility-administered medications for this visit. (Ophthalmic Drugs)   Current Outpatient Medications (Other)  Medication Sig  . amLODipine (NORVASC) 10 MG tablet Take 1 tablet (10 mg total) by mouth daily.  Marland Kitchen aspirin EC 81 MG tablet Take 1 tablet (81 mg total) by mouth daily.  Marland Kitchen atorvastatin (LIPITOR) 20 MG tablet Take 1 tablet (20 mg total) by mouth daily.  . cholecalciferol (VITAMIN D-400) 10 MCG (400 UNIT) TABS tablet Take 1 tablet (400 Units total) by mouth daily.  . Continuous Blood Gluc Receiver (FREESTYLE LIBRE 14 DAY READER) DEVI 1 Device by Does not apply route continuous.  . Continuous Blood Gluc Sensor (FREESTYLE LIBRE 14 DAY SENSOR) MISC 1 each by Does not apply route continuous.  Marland Kitchen gabapentin (NEURONTIN) 300 MG capsule Take 1 capsule (300 mg total) by mouth at bedtime.  Marland Kitchen GARLIC PO Take 1 capsule by mouth daily.  (Patient not taking: Reported on 08/23/2020)  . glucose blood test strip Use as instructed 3 times daily  . ibuprofen (ADVIL) 600 MG tablet Take 1 tablet (600 mg total) by mouth every 6 (six) hours as needed for moderate pain.  Marland Kitchen insulin aspart (NOVOLOG FLEXPEN) 100 UNIT/ML FlexPen Inject 28 Units into the skin 3 (three) times daily with meals.  . Insulin Glargine (BASAGLAR KWIKPEN) 100 UNIT/ML Inject 0.54 mLs (54 Units total) into the skin at bedtime.  . Insulin Pen Needle (B-D UF III MINI PEN NEEDLES) 31G X 5 MM MISC USE AS DIRECTED  . levETIRAcetam (KEPPRA XR) 500 MG 24 hr tablet Take 1 tablet (500 mg total) by mouth daily.  Marland Kitchen lisinopril (ZESTRIL) 10 MG tablet Take 1 tablet (10 mg total) by mouth daily.  . Misc Natural Products (APPLE CIDER VINEGAR DIET PO) Take 1 capsule by mouth daily. (Patient not taking: Reported on 08/23/2020)  . Multiple Vitamin (MULTIVITAMIN WITH MINERALS) TABS tablet Take 1 tablet by mouth daily. Reported on 11/13/2015  . ONETOUCH DELICA LANCETS 33G MISC 1 each by Does not apply route 3 (three) times daily.  . SUPER B COMPLEX/C PO Take 1 capsule by mouth daily.   No current facility-administered medications for this visit. (Other)      REVIEW OF  SYSTEMS: ROS    Positive for: Endocrine   Last edited by Elyse Jarvis on 08/28/2020  8:42 AM. (History)       ALLERGIES Allergies  Allergen Reactions  . Hctz [Hydrochlorothiazide]     Causes ED     PAST MEDICAL HISTORY Past Medical History:  Diagnosis Date  . Diabetes mellitus   . Erectile dysfunction   . Headache   . Hypertension   . Seizures (HCC)    Past Surgical History:  Procedure Laterality Date  . NO PAST SURGERIES    . REFRACTIVE SURGERY Bilateral 04/2018    FAMILY HISTORY Family History  Problem Relation Age of Onset  . Hypertension Mother   . Diabetes Mother   . Lupus Mother   . Kidney failure Mother   . Diabetes Father   . Hypertension Father   . Kidney failure Father      SOCIAL HISTORY Social History   Tobacco Use  . Smoking status: Never Smoker  . Smokeless tobacco: Never Used  Vaping Use  . Vaping Use: Never used  Substance Use Topics  . Alcohol use: Yes    Alcohol/week: 0.0 standard drinks    Comment: occ  . Drug use: No         OPHTHALMIC EXAM:  Base Eye Exam    Visual Acuity (Snellen - Linear)      Right Left   Dist [place] 20/25 20/40 -1   Dist ph [place]  20/30 -2       Tonometry (Tonopen, 8:46 AM)      Right Left   Pressure 14 14       Neuro/Psych    Oriented x3: Yes   Mood/Affect: Normal       Dilation    Right eye: 1.0% Mydriacyl, 2.5% Phenylephrine @ 8:46 AM        Slit Lamp and Fundus Exam    External Exam      Right Left   External Normal Normal       Slit Lamp Exam      Right Left   Lids/Lashes Normal I will increase left eye, suggest levator aponeurosis dehiscence.  Full function is noted on eyelid elevation   Conjunctiva/Sclera White and quiet White and quiet   Cornea Clear Clear   Anterior Chamber Deep and quiet Deep and quiet   Iris Round and reactive Neovascularization, .  Less active yet still present, on dilated exam pupillary   Lens 2+ Nuclear sclerosis 2+ Nuclear sclerosis   Anterior Vitreous Normal Normal       Fundus Exam      Right Left   Posterior Vitreous Normal    Disc Normal    C/D Ratio 0.4    Macula Microaneurysms, no clinically significant macular edema    Vessels PDR-active    Periphery Neovascularization ,    scatter PRP nasally, temporally and superiorly, room inferiorly for completion of PRP           IMAGING AND PROCEDURES  Imaging and Procedures for 08/28/20  Color Fundus Photography Optos - OU - Both Eyes       Right Eye Progression has improved. Disc findings include normal observations. Macula : microaneurysms. Vessels : Neovascularization.   Left Eye Progression has improved. Disc findings include increased cup to disc ratio, neovascularization. Macula :  microaneurysms. Vessels : Neovascularization.   Notes PDR OD still active inferior and inferonasal.  Neovascularization elsewhere present.  Good PRP noted nasal superior and temporal OD.  OS with neovascularization near the nerve, so NVD is present.  Left eye will need completion of PRP inferiorly at some point as well.       Destruction of Retinal Lesion, Photocoagulation - OD - Right Eye       Time Out Confirmed correct patient, procedure, site, and patient consented.   Anesthesia Topical anesthesia was used. Anesthetic medications included Proparacaine 0.5%.   Laser Information Color was yellow. The duration in seconds was 0.03. The spot size was 390 microns. Laser power was 380. Total spots was 1414.   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 Proliferative diabetic retinopathy of right eye without macular edema associated with type 2 diabetes mellitus (HCC)  H84.6962 Destruction of Retinal Lesion, Photocoagulation - OD - Right Eye  2. Type 2 diabetes mellitus with right eye affected by proliferative retinopathy without macular edema, with long-term current use of insulin (HCC)  E11.3591 Color Fundus Photography Optos - OU - Both Eyes   Z79.4   3. Type 2 diabetes mellitus with left eye affected by proliferative retinopathy without macular edema, with long-term current use of insulin (HCC)   X52.8413    Z79.4   4. Involutional ptosis, acquired, left  H02.402   5. Iris neovascularization, left  H21.1X2     1.  OS, 1 month status post additional PRP, much less iris neovascularization with normal intraocular pressure  2.  OD with neovascularization elsewhere inferonasal and inferior to the nerve, with large region inferiorly requiring PRP OD today.  3.  Dilate OU next  4.,  Including levator aponeurosis dehiscence OS with minor ptosis will discuss visit possible oculoplastics evaluation  Ophthalmic Meds Ordered this visit:  No orders of the defined types were placed in this encounter.      Return in about 9 weeks (around 10/30/2020) for DILATE OU, COLOR FP.  There are no Patient Instructions on file for this visit.   Explained the diagnoses, plan, and follow up with the patient and they expressed understanding.  Patient expressed understanding of the importance of proper follow up care.   Alford Highland Reiner Loewen M.D. Diseases & Surgery of the Retina and Vitreous [facility] 08/28/20     Abbreviations: M myopia (nearsighted); A astigmatism; H hyperopia (farsighted); P presbyopia; Mrx spectacle prescription;  CTL contact lenses; OD right eye; OS left eye; OU both eyes  XT exotropia; ET esotropia; PEK punctate epithelial keratitis; PEE punctate epithelial erosions; DES dry eye syndrome; MGD meibomian gland dysfunction; ATs artificial tears; PFAT's preservative free artificial tears; NSC nuclear sclerotic cataract; PSC posterior subcapsular cataract; ERM epi-retinal membrane; PVD posterior vitreous detachment; RD retinal detachment; DM diabetes mellitus; DR diabetic retinopathy; NPDR non-proliferative diabetic retinopathy; PDR proliferative diabetic retinopathy; CSME clinically significant macular edema; DME diabetic macular edema; dbh dot blot hemorrhages; CWS cotton wool spot; POAG primary open angle glaucoma; C/D cup-to-disc ratio; HVF humphrey visual field; GVF  goldmann visual  field; OCT optical coherence tomography; IOP intraocular pressure; BRVO Branch retinal vein occlusion; CRVO central retinal vein occlusion; CRAO central retinal artery occlusion; BRAO branch retinal artery occlusion; RT retinal tear; SB scleral buckle; PPV pars plana vitrectomy; VH Vitreous hemorrhage; PRP panretinal laser photocoagulation; IVK intravitreal kenalog; VMT vitreomacular traction; MH Macular hole;  NVD neovascularization of the disc; NVE neovascularization elsewhere; AREDS age related eye disease study; ARMD age related macular degeneration; POAG primary open angle glaucoma; EBMD epithelial/anterior basement membrane dystrophy; ACIOL anterior chamber intraocular lens; IOL intraocular lens; PCIOL posterior chamber intraocular lens; Phaco/IOL phacoemulsification with intraocular lens placement; [place] photorefractive keratectomy; LASIK laser assisted in situ keratomileusis; HTN hypertension; DM diabetes mellitus; COPD chronic obstructive pulmonary disease

## 2020-10-12 DIAGNOSIS — Z20822 Contact with and (suspected) exposure to covid-19: Secondary | ICD-10-CM | POA: Diagnosis not present

## 2020-10-30 ENCOUNTER — Ambulatory Visit (INDEPENDENT_AMBULATORY_CARE_PROVIDER_SITE_OTHER): Payer: BC Managed Care – PPO | Admitting: Ophthalmology

## 2020-10-30 ENCOUNTER — Other Ambulatory Visit: Payer: Self-pay

## 2020-10-30 ENCOUNTER — Encounter (INDEPENDENT_AMBULATORY_CARE_PROVIDER_SITE_OTHER): Payer: Self-pay | Admitting: Ophthalmology

## 2020-10-30 DIAGNOSIS — E11311 Type 2 diabetes mellitus with unspecified diabetic retinopathy with macular edema: Secondary | ICD-10-CM

## 2020-10-30 DIAGNOSIS — Z794 Long term (current) use of insulin: Secondary | ICD-10-CM

## 2020-10-30 DIAGNOSIS — E113592 Type 2 diabetes mellitus with proliferative diabetic retinopathy without macular edema, left eye: Secondary | ICD-10-CM

## 2020-10-30 DIAGNOSIS — E113591 Type 2 diabetes mellitus with proliferative diabetic retinopathy without macular edema, right eye: Secondary | ICD-10-CM

## 2020-10-30 NOTE — Progress Notes (Signed)
10/30/2020     CHIEF COMPLAINT Patient presents for Retina Follow Up (9 WK FU OU////Pt reports around 2 weeks ago he woke up with very blurry vision that went away that same day, no new F/F OU, no pain or pressure OU.////Last A1C: 9.0  06/2020////Last BS: unknown, needs new machine. )   HISTORY OF PRESENT ILLNESS: Albert Garcia is a 50 y.o. male who presents to the clinic today for:   HPI    Retina Follow Up    Patient presents with  Diabetic Retinopathy.  In both eyes.  This started 9 weeks ago.  Duration of 9 weeks.  Since onset it is stable. Additional comments: 9 WK FU OU    Pt reports around 2 weeks ago he woke up with very blurry vision that went away that same day, no new F/F OU, no pain or pressure OU.    Last A1C: 9.0  06/2020    Last BS: unknown, needs new machine.        Last edited by Nichola Sizer D on 10/30/2020  8:47 AM. (History)      Referring physician: Vevelyn Francois, NP [address] [place],  [place] [address]  HISTORICAL INFORMATION:   Selected notes from the MEDICAL RECORD NUMBER    Lab Results  Component Value Date   HGBA1C 9.6 (A) 08/23/2020   HGBA1C 9.6 08/23/2020   HGBA1C 9.6 (A) 08/23/2020   HGBA1C 9.6 (A) 08/23/2020     CURRENT MEDICATIONS: No current outpatient medications on file. (Ophthalmic Drugs)   No current facility-administered medications for this visit. (Ophthalmic Drugs)   Current Outpatient Medications (Other)  Medication Sig  . amLODipine (NORVASC) 10 MG tablet Take 1 tablet (10 mg total) by mouth daily.  Marland Kitchen aspirin EC 81 MG tablet Take 1 tablet (81 mg total) by mouth daily.  Marland Kitchen atorvastatin (LIPITOR) 20 MG tablet Take 1 tablet (20 mg total) by mouth daily.  . cholecalciferol (VITAMIN D-400) 10 MCG (400 UNIT) TABS tablet Take 1 tablet (400 Units total) by mouth daily.  . Continuous Blood Gluc Receiver (FREESTYLE LIBRE 14 DAY READER) DEVI 1 Device by Does not apply route continuous.  . Continuous Blood Gluc Sensor  (FREESTYLE LIBRE 14 DAY SENSOR) MISC 1 each by Does not apply route continuous.  Marland Kitchen gabapentin (NEURONTIN) 300 MG capsule Take 1 capsule (300 mg total) by mouth at bedtime.  Marland Kitchen GARLIC PO Take 1 capsule by mouth daily. (Patient not taking: Reported on 08/23/2020)  . glucose blood test strip Use as instructed 3 times daily  . ibuprofen (ADVIL) 600 MG tablet Take 1 tablet (600 mg total) by mouth every 6 (six) hours as needed for moderate pain.  Marland Kitchen insulin aspart (NOVOLOG FLEXPEN) 100 UNIT/ML FlexPen Inject 28 Units into the skin 3 (three) times daily with meals.  . Insulin Glargine (BASAGLAR KWIKPEN) 100 UNIT/ML Inject 0.54 mLs (54 Units total) into the skin at bedtime.  . Insulin Pen Needle (B-D UF III MINI PEN NEEDLES) 31G X 5 MM MISC USE AS DIRECTED  . levETIRAcetam (KEPPRA XR) 500 MG 24 hr tablet Take 1 tablet (500 mg total) by mouth daily.  Marland Kitchen lisinopril (ZESTRIL) 10 MG tablet Take 1 tablet (10 mg total) by mouth daily.  . Misc Natural Products (APPLE CIDER VINEGAR DIET PO) Take 1 capsule by mouth daily. (Patient not taking: Reported on 08/23/2020)  . Multiple Vitamin (MULTIVITAMIN WITH MINERALS) TABS tablet Take 1 tablet by mouth daily. Reported on 11/13/2015  .  ONETOUCH DELICA LANCETS 33G MISC 1 each by Does not apply route 3 (three) times daily.  . SUPER B COMPLEX/C PO Take 1 capsule by mouth daily.   No current facility-administered medications for this visit. (Other)      REVIEW OF SYSTEMS:    ALLERGIES Allergies  Allergen Reactions  . Hctz [Hydrochlorothiazide]     Causes ED     PAST MEDICAL HISTORY Past Medical History:  Diagnosis Date  . Diabetes mellitus   . Erectile dysfunction   . Headache   . Hypertension   . Seizures (HCC)    Past Surgical History:  Procedure Laterality Date  . NO PAST SURGERIES    . REFRACTIVE SURGERY Bilateral 04/2018    FAMILY HISTORY Family History  Problem Relation Age of Onset  . Hypertension Mother   . Diabetes Mother   . Lupus  Mother   . Kidney failure Mother   . Diabetes Father   . Hypertension Father   . Kidney failure Father     SOCIAL HISTORY Social History   Tobacco Use  . Smoking status: Never Smoker  . Smokeless tobacco: Never Used  Vaping Use  . Vaping Use: Never used  Substance Use Topics  . Alcohol use: Yes    Alcohol/week: 0.0 standard drinks    Comment: occ  . Drug use: No         OPHTHALMIC EXAM: Base Eye Exam    Visual Acuity (ETDRS)      Right Left   Dist [place] 20/20 -2 20/30 -1   Dist ph [place]  20/25 -2       Tonometry (Tonopen, 8:53 AM)      Right Left   Pressure 16 25       Tonometry #2 (Tonopen, 8:53 AM)      Right Left   Pressure  24       Pupils      Dark Light Shape React APD   Right 3 3 Round Minimal None   Left 3 3 Round Minimal None       Visual Fields (Counting fingers)      Left Right    Full Full       Extraocular Movement      Right Left    Full Full       Neuro/Psych    Oriented x3: Yes   Mood/Affect: Normal       Dilation    Both eyes: 1.0% Mydriacyl, 2.5% Phenylephrine @ 8:53 AM        Slit Lamp and Fundus Exam    External Exam      Right Left   External Normal Normal       Slit Lamp Exam      Right Left   Lids/Lashes Normal I will increase left eye, suggest levator aponeurosis dehiscence.  Full function is noted on eyelid elevation   Conjunctiva/Sclera White and quiet White and quiet   Cornea Clear Clear   Anterior Chamber Deep and quiet Deep and quiet   Iris Round and reactive Neovascularization, .  Less active yet still present, on dilated exam pupillary   Lens 2+ Nuclear sclerosis 2+ Nuclear sclerosis   Anterior Vitreous Normal Normal       Fundus Exam      Right Left   Posterior Vitreous Normal Normal   Disc Normal Neovascularization greater than 10-c   C/D Ratio 0.4 0.4   Macula Microaneurysms, no clinically significant macular edema Microaneurysms, no clinically significant  macular edema   Vessels PDR-quiet  PDR-quiet   Periphery Neovascularization ,    scatter PRP nasally, temporally and superiorly, room inferiorly for completion of PRP Neovascularization, with  scatter superonasally,  temporally  PRP now presetn          IMAGING AND PROCEDURES  Imaging and Procedures for 10/30/20  Color Fundus Photography Optos - OU - Both Eyes       Right Eye Progression has improved. Disc findings include normal observations. Macula : microaneurysms. Vessels : Neovascularization.   Left Eye Progression has improved. Disc findings include increased cup to disc ratio, neovascularization. Macula : microaneurysms. Vessels : Neovascularization.   Notes PDR OD no longer active inferior and inferonasal.  Neovascularization elsewhere, inactive, now fibrotic present.  Good PRP noted nasal superior and temporal OD.  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 Diabetic retinopathy of both eyes with macular edema associated with type 2 diabetes mellitus, unspecified retinopathy severity (HCC)  E11.311 Color Fundus Photography Optos - OU - Both Eyes  2. Type 2 diabetes mellitus with left eye affected by proliferative retinopathy without macular edema, with long-term current use of insulin (HCC)  C94.7096    Z79.4   3. Proliferative diabetic retinopathy of right eye without macular edema associated with type 2 diabetes mellitus (HCC)  E11.3591     1.  OU now with quiescent involutional PDR status post good PRP 360 media clear.  2.  Patient instructed to contact the office promptly for new onset visual acuity declines or  distortions.  3.  Ophthalmic Meds Ordered this visit:  No orders of the defined types were placed in this encounter.      Return in about 5 months (around 03/30/2021) for COLOR FP, DILATE OU.  There are no Patient Instructions on file for this visit.   Explained the diagnoses, plan, and follow up with the patient and they expressed understanding.  Patient expressed understanding of the importance of proper follow up care.   Alford Highland Naomii Kreger M.D. Diseases & Surgery of the Retina and Vitreous [facility] 10/30/20     Abbreviations: M myopia (nearsighted); A astigmatism; H hyperopia (farsighted); P presbyopia; Mrx spectacle prescription;  CTL contact lenses; OD right eye; OS left eye; OU both eyes  XT exotropia; ET esotropia; PEK punctate epithelial keratitis; PEE punctate epithelial erosions; DES dry eye syndrome; MGD meibomian gland dysfunction; ATs artificial tears; PFAT's preservative free artificial tears; NSC nuclear sclerotic cataract; PSC posterior subcapsular cataract; ERM epi-retinal membrane; PVD posterior vitreous detachment; RD retinal detachment; DM diabetes mellitus; DR diabetic retinopathy; NPDR non-proliferative diabetic retinopathy; PDR proliferative diabetic retinopathy; CSME clinically significant macular edema; DME diabetic macular edema; dbh dot blot hemorrhages; CWS cotton wool spot; POAG primary open angle glaucoma; C/D cup-to-disc ratio; HVF humphrey visual field; GVF goldmann visual field; OCT optical coherence tomography; IOP intraocular pressure; BRVO Branch retinal vein occlusion; CRVO central retinal vein occlusion; CRAO central retinal artery occlusion; BRAO branch retinal artery occlusion; RT retinal tear; SB scleral buckle; PPV pars plana vitrectomy; VH Vitreous hemorrhage; PRP  panretinal laser photocoagulation; IVK intravitreal kenalog; VMT vitreomacular traction; MH Macular hole;  NVD neovascularization of the disc; NVE neovascularization  elsewhere; AREDS age related eye disease study; ARMD age related macular degeneration; POAG primary open angle glaucoma; EBMD epithelial/anterior basement membrane dystrophy; ACIOL anterior chamber intraocular lens; IOL intraocular lens; PCIOL posterior chamber intraocular lens; Phaco/IOL phacoemulsification with intraocular lens placement; PRK photorefractive keratectomy; LASIK laser assisted in situ keratomileusis; HTN hypertension; DM diabetes mellitus; COPD chronic obstructive pulmonary disease

## 2020-10-30 NOTE — Assessment & Plan Note (Signed)
Good PRP OS, now quiescent PDR, no active CSME will observe

## 2020-10-30 NOTE — Assessment & Plan Note (Signed)
Good PRP OD, neovascularization elsewhere with now fibrotic, not active, will observe

## 2020-11-14 ENCOUNTER — Other Ambulatory Visit: Payer: Self-pay | Admitting: Internal Medicine

## 2020-11-14 DIAGNOSIS — Z794 Long term (current) use of insulin: Secondary | ICD-10-CM

## 2020-11-22 ENCOUNTER — Ambulatory Visit: Payer: Self-pay | Admitting: Nurse Practitioner

## 2020-11-29 DIAGNOSIS — E1142 Type 2 diabetes mellitus with diabetic polyneuropathy: Secondary | ICD-10-CM

## 2020-12-27 MED ORDER — GABAPENTIN 300 MG PO CAPS: 300.0000 mg | ORAL_CAPSULE | Freq: Three times a day (TID) | ORAL | 0 refills | Status: DC

## 2021-04-02 ENCOUNTER — Encounter (INDEPENDENT_AMBULATORY_CARE_PROVIDER_SITE_OTHER): Payer: BC Managed Care – PPO | Admitting: Ophthalmology

## 2021-04-24 ENCOUNTER — Telehealth: Payer: Self-pay

## 2021-04-24 ENCOUNTER — Encounter (INDEPENDENT_AMBULATORY_CARE_PROVIDER_SITE_OTHER): Payer: BC Managed Care – PPO | Admitting: Ophthalmology

## 2021-04-25 ENCOUNTER — Other Ambulatory Visit: Payer: Self-pay

## 2021-04-25 ENCOUNTER — Other Ambulatory Visit: Payer: Self-pay | Admitting: Nurse Practitioner

## 2021-04-25 ENCOUNTER — Telehealth: Payer: Self-pay

## 2021-04-25 DIAGNOSIS — E1142 Type 2 diabetes mellitus with diabetic polyneuropathy: Secondary | ICD-10-CM

## 2021-04-25 DIAGNOSIS — I10 Essential (primary) hypertension: Secondary | ICD-10-CM

## 2021-04-25 DIAGNOSIS — IMO0002 Reserved for concepts with insufficient information to code with codable children: Secondary | ICD-10-CM

## 2021-04-25 MED ORDER — LISINOPRIL 10 MG PO TABS
10.0000 mg | ORAL_TABLET | Freq: Every day | ORAL | 3 refills | Status: DC
  Filled 2021-04-25: qty 90, 90d supply, fill #0

## 2021-04-25 MED ORDER — NOVOLOG FLEXPEN 100 UNIT/ML ~~LOC~~ SOPN
28.0000 [IU] | PEN_INJECTOR | Freq: Three times a day (TID) | SUBCUTANEOUS | 3 refills | Status: DC
  Filled 2021-04-25: qty 30, 35d supply, fill #0

## 2021-04-25 MED ORDER — AMLODIPINE BESYLATE 10 MG PO TABS
10.0000 mg | ORAL_TABLET | Freq: Every day | ORAL | 3 refills | Status: DC
  Filled 2021-04-25: qty 90, 90d supply, fill #0

## 2021-04-25 MED ORDER — BD PEN NEEDLE MINI U/F 31G X 5 MM MISC
2 refills | Status: DC
  Filled 2021-04-25: qty 100, 25d supply, fill #0

## 2021-04-25 MED ORDER — BASAGLAR KWIKPEN 100 UNIT/ML ~~LOC~~ SOPN
54.0000 [IU] | PEN_INJECTOR | Freq: Every day | SUBCUTANEOUS | 3 refills | Status: DC
  Filled 2021-04-25: qty 15, 27d supply, fill #0

## 2021-04-25 MED ORDER — GABAPENTIN 300 MG PO CAPS
300.0000 mg | ORAL_CAPSULE | Freq: Three times a day (TID) | ORAL | 0 refills | Status: DC
  Filled 2021-04-25: qty 90, 30d supply, fill #0

## 2021-04-25 MED ORDER — ATORVASTATIN CALCIUM 20 MG PO TABS
20.0000 mg | ORAL_TABLET | Freq: Every day | ORAL | 3 refills | Status: DC
  Filled 2021-04-25: qty 90, 90d supply, fill #0

## 2021-04-25 MED ORDER — CHOLECALCIFEROL 10 MCG (400 UNIT) PO TABS
400.0000 [IU] | ORAL_TABLET | Freq: Every day | ORAL | 0 refills | Status: DC
  Filled 2021-04-25: qty 100, 100d supply, fill #0

## 2021-04-25 NOTE — Telephone Encounter (Signed)
Gabapentin X 3 900 mg a day And all other med's  to be refilled  Comm. Health and wellness

## 2021-04-26 ENCOUNTER — Other Ambulatory Visit: Payer: Self-pay

## 2021-05-03 ENCOUNTER — Telehealth (INDEPENDENT_AMBULATORY_CARE_PROVIDER_SITE_OTHER): Payer: BC Managed Care – PPO | Admitting: Nurse Practitioner

## 2021-05-03 ENCOUNTER — Other Ambulatory Visit: Payer: Self-pay

## 2021-05-03 DIAGNOSIS — Z794 Long term (current) use of insulin: Secondary | ICD-10-CM

## 2021-05-03 DIAGNOSIS — I10 Essential (primary) hypertension: Secondary | ICD-10-CM

## 2021-05-03 DIAGNOSIS — G40909 Epilepsy, unspecified, not intractable, without status epilepticus: Secondary | ICD-10-CM | POA: Diagnosis not present

## 2021-05-03 DIAGNOSIS — IMO0002 Reserved for concepts with insufficient information to code with codable children: Secondary | ICD-10-CM

## 2021-05-03 DIAGNOSIS — E1142 Type 2 diabetes mellitus with diabetic polyneuropathy: Secondary | ICD-10-CM | POA: Diagnosis not present

## 2021-05-03 DIAGNOSIS — E118 Type 2 diabetes mellitus with unspecified complications: Secondary | ICD-10-CM | POA: Diagnosis not present

## 2021-05-03 DIAGNOSIS — E1165 Type 2 diabetes mellitus with hyperglycemia: Secondary | ICD-10-CM

## 2021-05-03 MED ORDER — BD PEN NEEDLE MINI U/F 31G X 5 MM MISC: 2 refills | Status: AC

## 2021-05-03 MED ORDER — GABAPENTIN 800 MG PO TABS: 800.0000 mg | ORAL_TABLET | Freq: Every day | ORAL | 3 refills | Status: DC

## 2021-05-03 MED ORDER — NOVOLOG FLEXPEN 100 UNIT/ML ~~LOC~~ SOPN: 28.0000 [IU] | PEN_INJECTOR | Freq: Three times a day (TID) | SUBCUTANEOUS | 3 refills | Status: DC

## 2021-05-03 MED ORDER — AMLODIPINE BESYLATE 10 MG PO TABS: 10.0000 mg | ORAL_TABLET | Freq: Every day | ORAL | 3 refills | Status: DC

## 2021-05-03 MED ORDER — ATORVASTATIN CALCIUM 20 MG PO TABS: 20.0000 mg | ORAL_TABLET | Freq: Every day | ORAL | 3 refills | Status: DC

## 2021-05-03 MED ORDER — BASAGLAR KWIKPEN 100 UNIT/ML ~~LOC~~ SOPN: 54.0000 [IU] | PEN_INJECTOR | Freq: Every day | SUBCUTANEOUS | 3 refills | Status: DC

## 2021-05-03 MED ORDER — LEVETIRACETAM ER 500 MG PO TB24: 500.0000 mg | ORAL_TABLET | Freq: Every day | ORAL | 4 refills | Status: DC

## 2021-05-03 MED ORDER — LISINOPRIL 10 MG PO TABS: 10.0000 mg | ORAL_TABLET | Freq: Every day | ORAL | 3 refills | Status: DC

## 2021-05-03 NOTE — Progress Notes (Signed)
   [facility] Patient [facility] [address] [place], [place]  [address] Phone:  [phone]   Fax:  [phone] Virtual Visit via Video Note  I connected with Albert Garcia on 05/03/21 at  3:40 PM EDT by video and verified that I am speaking with the correct person using two identifiers.   I discussed the limitations, risks, security and privacy concerns of performing an evaluation and management service by video and the availability of in person appointments. I also discussed with the patient that there may be a patient responsible charge related to this service. The patient expressed understanding and agreed to proceed.  Patient home Provider Office  History of Present Illness: He  has a past medical history of Diabetes mellitus, Erectile dysfunction, Headache, Hypertension, and Seizures (HCC).  He reports that he has had a change in employeement. So he is without insurance. He is starting a new job that has taken him to [facility] for training. He is unable to follow up in person. He does need his medications refilled. He has not been able to check his CBG due to insurance. He Denies headache, dizziness, visual changes, shortness of breath, dyspnea on exertion, chest pain, nausea, vomiting or any edema.     Observations/Objective: Virtual visit in no acute distress no exam  Assessment and Plan:  Assessment  Primary Diagnosis & Pertinent Problem List: Diagnoses of Type 2 diabetes mellitus with diabetic polyneuropathy, with long-term current use of insulin (HCC), Essential hypertension, Seizure disorder (HCC), and Uncontrolled type 2 diabetes mellitus with complication, with long-term current use of insulin (HCC) were pertinent to this visit.  Visit Diagnosis: 1. Type 2 diabetes mellitus with diabetic polyneuropathy, with long-term current use of insulin (HCC)  Encourage compliance with current treatment regimen   Encourage regular CBG monitoring Encourage contacting office if excessive  hyperglycemia and or hypoglycemia Lifestyle modification with healthy diet (fewer calories, more high fiber foods, whole grains and non-starchy vegetables, lower fat meat and fish, low-fat diary include healthy oils) regular exercise (physical activity) and weight loss Opthalmology exam discussed will follow up when insurance is restored Nutritional consult recommended Home BP monitoring also encouraged goal <130/80   2. Essential hypertension  Encouraged on going compliance with current medication regimen Encouraged home monitoring and recording BP <130/80 Eating a heart-healthy diet with less salt Encouraged regular physical activity  Recommend Weight loss   3. Seizure disorder (HCC) Chronic Stable no recent seizure activity Continue with current regimen.  No changes warranted. Good patient compliance.  4. Uncontrolled type 2 diabetes mellitus with complication, with long-term current use of insulin (HCC)     Follow Up Instructions:    I discussed the assessment and treatment plan with the patient. The patient was provided an opportunity to ask questions and all were answered. The patient agreed with the plan and demonstrated an understanding of the instructions.   The patient was advised to call back or seek an in-person evaluation if the symptoms worsen or if the condition fails to improve as anticipated.  I provided 10 minutes of video- visit time during this encounter.   Barbette Merino, NP

## 2021-05-07 NOTE — Telephone Encounter (Signed)
error 

## 2021-05-23 ENCOUNTER — Ambulatory Visit: Payer: BC Managed Care – PPO | Admitting: Neurology

## 2021-05-23 ENCOUNTER — Encounter: Payer: Self-pay | Admitting: Neurology

## 2021-10-11 ENCOUNTER — Ambulatory Visit: Payer: Self-pay | Admitting: Nurse Practitioner

## 2021-10-24 ENCOUNTER — Other Ambulatory Visit: Payer: Self-pay | Admitting: Nurse Practitioner

## 2021-10-24 DIAGNOSIS — I10 Essential (primary) hypertension: Secondary | ICD-10-CM

## 2021-10-25 ENCOUNTER — Other Ambulatory Visit: Payer: Self-pay | Admitting: Nurse Practitioner

## 2021-12-02 ENCOUNTER — Encounter (HOSPITAL_BASED_OUTPATIENT_CLINIC_OR_DEPARTMENT_OTHER): Payer: Self-pay | Admitting: *Deleted

## 2021-12-02 ENCOUNTER — Emergency Department (HOSPITAL_BASED_OUTPATIENT_CLINIC_OR_DEPARTMENT_OTHER)
Admission: EM | Admit: 2021-12-02 | Discharge: 2021-12-03 | Disposition: A | Discharge status: Home / Self Care | Payer: Self-pay | Attending: Emergency Medicine | Admitting: Emergency Medicine

## 2021-12-02 ENCOUNTER — Other Ambulatory Visit: Payer: Self-pay

## 2021-12-02 DIAGNOSIS — R52 Pain, unspecified: Secondary | ICD-10-CM

## 2021-12-02 DIAGNOSIS — M545 Low back pain, unspecified: Secondary | ICD-10-CM | POA: Diagnosis not present

## 2021-12-02 DIAGNOSIS — M79662 Pain in left lower leg: Secondary | ICD-10-CM | POA: Insufficient documentation

## 2021-12-02 DIAGNOSIS — M79605 Pain in left leg: Secondary | ICD-10-CM | POA: Diagnosis not present

## 2021-12-02 DIAGNOSIS — Z79899 Other long term (current) drug therapy: Secondary | ICD-10-CM | POA: Insufficient documentation

## 2021-12-02 DIAGNOSIS — Z794 Long term (current) use of insulin: Secondary | ICD-10-CM | POA: Insufficient documentation

## 2021-12-02 DIAGNOSIS — Z7982 Long term (current) use of aspirin: Secondary | ICD-10-CM | POA: Insufficient documentation
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
## 2021-12-03 ENCOUNTER — Emergency Department (HOSPITAL_BASED_OUTPATIENT_CLINIC_OR_DEPARTMENT_OTHER): Payer: Self-pay

## 2021-12-03 DIAGNOSIS — M545 Low back pain, unspecified: Secondary | ICD-10-CM | POA: Diagnosis not present

## 2021-12-03 MED ORDER — DIAZEPAM 2 MG PO TABS
2.0000 mg | ORAL_TABLET | Freq: Once | ORAL | Status: AC
  Administered 2021-12-03: 2 mg via ORAL
  Filled 2021-12-03: qty 1

## 2021-12-03 MED ORDER — KETOROLAC TROMETHAMINE 60 MG/2ML IM SOLN
30.0000 mg | Freq: Once | INTRAMUSCULAR | Status: AC
  Administered 2021-12-03: 30 mg via INTRAMUSCULAR
  Filled 2021-12-03: qty 2

## 2021-12-03 MED ORDER — MELOXICAM 7.5 MG PO TABS: 7.5000 mg | ORAL_TABLET | Freq: Every day | ORAL | 0 refills | Status: DC

## 2021-12-03 MED ORDER — DIAZEPAM 5 MG PO TABS: 2.5000 mg | ORAL_TABLET | Freq: Four times a day (QID) | ORAL | 0 refills | Status: DC | PRN
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
## 2022-01-27 ENCOUNTER — Encounter: Payer: Self-pay | Admitting: Nurse Practitioner

## 2022-01-27 ENCOUNTER — Ambulatory Visit (INDEPENDENT_AMBULATORY_CARE_PROVIDER_SITE_OTHER): Payer: BC Managed Care – PPO | Admitting: Nurse Practitioner

## 2022-01-27 VITALS — BP 153/87 | HR 82 | Temp 97.9°F | Resp 20 | Ht 68.0 in | Wt 252.2 lb

## 2022-01-27 DIAGNOSIS — Z113 Encounter for screening for infections with a predominantly sexual mode of transmission: Secondary | ICD-10-CM

## 2022-01-27 DIAGNOSIS — Z1211 Encounter for screening for malignant neoplasm of colon: Secondary | ICD-10-CM | POA: Diagnosis not present

## 2022-01-27 DIAGNOSIS — Z0000 Encounter for general adult medical examination without abnormal findings: Secondary | ICD-10-CM | POA: Insufficient documentation

## 2022-01-27 DIAGNOSIS — E118 Type 2 diabetes mellitus with unspecified complications: Secondary | ICD-10-CM

## 2022-01-27 DIAGNOSIS — N529 Male erectile dysfunction, unspecified: Secondary | ICD-10-CM

## 2022-01-27 DIAGNOSIS — E1169 Type 2 diabetes mellitus with other specified complication: Secondary | ICD-10-CM | POA: Diagnosis not present

## 2022-01-27 DIAGNOSIS — E785 Hyperlipidemia, unspecified: Secondary | ICD-10-CM

## 2022-01-27 DIAGNOSIS — I10 Essential (primary) hypertension: Secondary | ICD-10-CM | POA: Diagnosis not present

## 2022-01-27 LAB — POCT URINALYSIS DIP (CLINITEK)
Bilirubin, UA: NEGATIVE
Glucose, UA: 500 mg/dL — AB
Ketones, POC UA: NEGATIVE mg/dL
Leukocytes, UA: NEGATIVE
Nitrite, UA: NEGATIVE
POC PROTEIN,UA: 100 — AB
Spec Grav, UA: >=1.03 — AB (ref 1.010–1.025)
Urobilinogen, UA: 0.2 E.U./dL
pH, UA: 6 (ref 5.0–8.0)

## 2022-01-27 LAB — POCT GLYCOSYLATED HEMOGLOBIN (HGB A1C)
HbA1c POC (<> result, manual entry): 10.2 % (ref 4.0–5.6)
HbA1c, POC (controlled diabetic range): 10.2 % — AB (ref 0.0–7.0)
HbA1c, POC (prediabetic range): 10.2 % — AB (ref 5.7–6.4)
Hemoglobin A1C: 10.2 % — AB (ref 4.0–5.6)

## 2022-01-27 NOTE — Patient Instructions (Addendum)
1. Health care maintenance ? ?- POCT glycosylated hemoglobin (Hb A1C) ?- POCT URINALYSIS DIP (CLINITEK) ?- CBC ?- Comprehensive metabolic panel ?- TSH ?- Lipid Panel ? ?2. Primary hypertension ? ?Low salt diet ? ? ?3. Hyperlipidemia associated with type 2 diabetes mellitus (HCC) ? ?Diabetic diet ? ? ?4. Colon cancer screening ? ?- Ambulatory referral to Gastroenterology ? ?5. Diabetic foot care ? ?- Ambulatory referral to Podiatry ? ?6. Erectile dysfunction, unspecified erectile dysfunction type ? ?- Ambulatory referral to Urology ? ?7. STD screening ? ?Will restart current medications; ? ?Current Outpatient Medications on File Prior to Visit  ?Medication Sig Dispense Refill  ? amLODipine (NORVASC) 10 MG tablet Take 1 tablet (10 mg total) by mouth daily. 90 tablet 3  ? aspirin EC 81 MG tablet Take 1 tablet (81 mg total) by mouth daily. 100 tablet 1  ? atorvastatin (LIPITOR) 20 MG tablet Take 1 tablet (20 mg total) by mouth daily. 90 tablet 3  ? cholecalciferol (VITAMIN D-400) 10 MCG (400 UNIT) TABS tablet Take 1 tablet (400 Units total) by mouth daily. 100 tablet 0  ? gabapentin (NEURONTIN) 800 MG tablet Take 1 tablet (800 mg total) by mouth at bedtime. 90 tablet 3  ? GARLIC PO Take 1 capsule by mouth daily.    ? glucose blood test strip Use as instructed 3 times daily 100 each 2  ? ibuprofen (ADVIL) 600 MG tablet Take 1 tablet (600 mg total) by mouth every 6 (six) hours as needed for moderate pain. 30 tablet 0  ? insulin aspart (NOVOLOG FLEXPEN) 100 UNIT/ML FlexPen Inject 28 Units into the skin 3 (three) times daily with meals. 75.6 mL 3  ? Insulin Glargine (BASAGLAR KWIKPEN) 100 UNIT/ML Inject 54 Units into the skin at bedtime. 48.6 mL 3  ? Insulin Pen Needle (B-D UF III MINI PEN NEEDLES) 31G X 5 MM MISC USE AS DIRECTED 100 each 2  ? levETIRAcetam (KEPPRA XR) 500 MG 24 hr tablet Take 1 tablet (500 mg total) by mouth daily. 90 tablet 4  ? lisinopril (ZESTRIL) 10 MG tablet Take 1 tablet (10 mg total) by mouth daily.  90 tablet 3  ? Multiple Vitamin (MULTIVITAMIN WITH MINERALS) TABS tablet Take 1 tablet by mouth daily. Reported on 11/13/2015    ? ONETOUCH DELICA LANCETS 33G MISC 1 each by Does not apply route 3 (three) times daily. 100 each 2  ? SUPER B COMPLEX/C PO Take 1 capsule by mouth daily.    ? Continuous Blood Gluc Receiver (FREESTYLE LIBRE 14 DAY READER) DEVI 1 Device by Does not apply route continuous. 1 each 1  ? diazepam (VALIUM) 5 MG tablet Take 0.5 tablets (2.5 mg total) by mouth every 6 (six) hours as needed for muscle spasms (leg pain). (Patient not taking: Reported on 01/27/2022) 10 tablet 0  ? meloxicam (MOBIC) 7.5 MG tablet Take 1 tablet (7.5 mg total) by mouth daily. (Patient not taking: Reported on 01/27/2022) 14 tablet 0  ? Misc Natural Products (APPLE CIDER VINEGAR DIET PO) Take 1 capsule by mouth daily. (Patient not taking: Reported on 01/27/2022)    ? [DISCONTINUED] omeprazole (PRILOSEC) 20 MG capsule TAKE 1 CAPSULE (20 MG TOTAL) BY MOUTH DAILY. (Patient not taking: Reported on 05/11/2020) 30 capsule 2  ? [DISCONTINUED] promethazine (PHENERGAN) 12.5 MG tablet Take 1 tablet (12.5 mg total) by mouth daily as needed for nausea or vomiting. (Patient not taking: Reported on 05/11/2020) 30 tablet 0  ? [DISCONTINUED] sildenafil (VIAGRA) 50 MG tablet Take 0.5-1  tablets (25-50 mg total) by mouth daily as needed for erectile dysfunction. (Patient not taking: Reported on 05/11/2020) 10 tablet 6  ? [DISCONTINUED] sitaGLIPtin-metformin (JANUMET) 50-1000 MG tablet Take 1 tablet by mouth 2 (two) times daily with a meal. (Patient not taking: Reported on 05/11/2020) 180 tablet 3  ? ?No current facility-administered medications on file prior to visit.  ? ? ? ? ?Follow up: ? ?Follow up in 3 months ? ? ? ?

## 2022-01-27 NOTE — Assessment & Plan Note (Signed)
- POCT glycosylated hemoglobin (Hb A1C) ?- POCT URINALYSIS DIP (CLINITEK) ?- CBC ?- Comprehensive metabolic panel ?- TSH ?- Lipid Panel ? ?2. Primary hypertension ? ?Low salt diet ? ? ?3. Hyperlipidemia associated with type 2 diabetes mellitus (HCC) ? ?Diabetic diet ? ? ?4. Colon cancer screening ? ?- Ambulatory referral to Gastroenterology ? ?5. Diabetic foot care ? ?- Ambulatory referral to Podiatry ? ?6. Erectile dysfunction, unspecified erectile dysfunction type ? ?- Ambulatory referral to Urology ? ?7. STD screening ? ?Will restart current medications; ? ?Current Outpatient Medications on File Prior to Visit  ?Medication Sig Dispense Refill  ?? amLODipine (NORVASC) 10 MG tablet Take 1 tablet (10 mg total) by mouth daily. 90 tablet 3  ?? aspirin EC 81 MG tablet Take 1 tablet (81 mg total) by mouth daily. 100 tablet 1  ?? atorvastatin (LIPITOR) 20 MG tablet Take 1 tablet (20 mg total) by mouth daily. 90 tablet 3  ?? cholecalciferol (VITAMIN D-400) 10 MCG (400 UNIT) TABS tablet Take 1 tablet (400 Units total) by mouth daily. 100 tablet 0  ?? gabapentin (NEURONTIN) 800 MG tablet Take 1 tablet (800 mg total) by mouth at bedtime. 90 tablet 3  ?? GARLIC PO Take 1 capsule by mouth daily.    ?? glucose blood test strip Use as instructed 3 times daily 100 each 2  ?? ibuprofen (ADVIL) 600 MG tablet Take 1 tablet (600 mg total) by mouth every 6 (six) hours as needed for moderate pain. 30 tablet 0  ?? insulin aspart (NOVOLOG FLEXPEN) 100 UNIT/ML FlexPen Inject 28 Units into the skin 3 (three) times daily with meals. 75.6 mL 3  ?? Insulin Glargine (BASAGLAR KWIKPEN) 100 UNIT/ML Inject 54 Units into the skin at bedtime. 48.6 mL 3  ?? Insulin Pen Needle (B-D UF III MINI PEN NEEDLES) 31G X 5 MM MISC USE AS DIRECTED 100 each 2  ?? levETIRAcetam (KEPPRA XR) 500 MG 24 hr tablet Take 1 tablet (500 mg total) by mouth daily. 90 tablet 4  ?? lisinopril (ZESTRIL) 10 MG tablet Take 1 tablet (10 mg total) by mouth daily. 90 tablet 3  ??  Multiple Vitamin (MULTIVITAMIN WITH MINERALS) TABS tablet Take 1 tablet by mouth daily. Reported on 11/13/2015    ?? ONETOUCH DELICA LANCETS 33G MISC 1 each by Does not apply route 3 (three) times daily. 100 each 2  ?? SUPER B COMPLEX/C PO Take 1 capsule by mouth daily.    ?? Continuous Blood Gluc Receiver (FREESTYLE LIBRE 14 DAY READER) DEVI 1 Device by Does not apply route continuous. 1 each 1  ?? diazepam (VALIUM) 5 MG tablet Take 0.5 tablets (2.5 mg total) by mouth every 6 (six) hours as needed for muscle spasms (leg pain). (Patient not taking: Reported on 01/27/2022) 10 tablet 0  ?? meloxicam (MOBIC) 7.5 MG tablet Take 1 tablet (7.5 mg total) by mouth daily. (Patient not taking: Reported on 01/27/2022) 14 tablet 0  ?? Misc Natural Products (APPLE CIDER VINEGAR DIET PO) Take 1 capsule by mouth daily. (Patient not taking: Reported on 01/27/2022)    ?? [DISCONTINUED] omeprazole (PRILOSEC) 20 MG capsule TAKE 1 CAPSULE (20 MG TOTAL) BY MOUTH DAILY. (Patient not taking: Reported on 05/11/2020) 30 capsule 2  ?? [DISCONTINUED] promethazine (PHENERGAN) 12.5 MG tablet Take 1 tablet (12.5 mg total) by mouth daily as needed for nausea or vomiting. (Patient not taking: Reported on 05/11/2020) 30 tablet 0  ?? [DISCONTINUED] sildenafil (VIAGRA) 50 MG tablet Take 0.5-1 tablets (25-50 mg total) by  mouth daily as needed for erectile dysfunction. (Patient not taking: Reported on 05/11/2020) 10 tablet 6  ?? [DISCONTINUED] sitaGLIPtin-metformin (JANUMET) 50-1000 MG tablet Take 1 tablet by mouth 2 (two) times daily with a meal. (Patient not taking: Reported on 05/11/2020) 180 tablet 3  ? ?No current facility-administered medications on file prior to visit.  ? ? ? ? ?Follow up: ? ?Follow up in 3 months ? ?

## 2022-01-27 NOTE — Progress Notes (Signed)
@Patient  ID: , male    DOB: 1971/10/10, 51 y.o.   MRN: [id number] ? ?Chief Complaint  ?Patient presents with  ? Annual Exam  ?  Pt stated he is here for annual physical.pt stated he wants blood work.  ? ? ?Referring provider: ?[id number], NP ? ? ?HPI ? ?Patient is here today for routine physical.  He states that he has been working in [employer] for the past 2 years.  He has been noncompliant with his medications.  This was due to not having any insurance.  Patient states that he has moved back to this area now and does have insurance with his new job.  He did get refills on all of his medications this past weekend.  He did not take his medications today.  It was noted that blood pressure was slightly elevated in office today.  Patient will start back on his medications today.  We discussed that his hemoglobin A1c was 10.2 in office today.  We discussed that is important to get back on his diabetic medication routine and follow a strict diabetic diet.  We will recheck patient in 3 months.  If this does not improve he will be referred to endocrinology for further management.  Overall patient has been doing well.  He states that he would like a referral to podiatry for diabetic foot care he does have ongoing issues with ingrown toenails.  Patient would also like a referral to GI for colon cancer screening.  He has never had this done in the past.  He is 51 years old now.  We will place referral today.  Patient is also requesting a referral to urology for check on prostate and erectile dysfunction.  Patient would also like routine STD screening today as well.  Denies f/c/s, n/v/d, hemoptysis, PND, leg swelling ?Denies chest pain or edema ? ? ? ? ? ?Allergies  ?Allergen Reactions  ? Hctz [Hydrochlorothiazide]   ?  Causes ED   ? ? ?Immunization History  ?Administered Date(s) Administered  ? Influenza Inj Mdck Quad Pf 06/30/2018  ? Influenza Whole 08/19/2006  ? Influenza,inj,Quad PF,6+ Mos 11/28/2016,  07/13/2019  ? Influenza-Unspecified 07/23/2017  ? PFIZER(Purple Top)SARS-COV-2 Vaccination 01/13/2020, 02/07/2020  ? Pneumococcal Polysaccharide-23 11/27/2004, 11/28/2016  ? Tdap 01/16/2013  ? ? ?Past Medical History:  ?Diagnosis Date  ? Diabetes mellitus   ? Erectile dysfunction   ? Headache   ? Hypertension   ? Seizures (HCC)   ? ? ?Tobacco History: ?Social History  ? ?Tobacco Use  ?Smoking Status Never  ?Smokeless Tobacco Never  ? ?Counseling given: Not Answered ? ? ?Outpatient Encounter Medications as of 01/27/2022  ?Medication Sig  ? amLODipine (NORVASC) 10 MG tablet Take 1 tablet (10 mg total) by mouth daily.  ? aspirin EC 81 MG tablet Take 1 tablet (81 mg total) by mouth daily.  ? atorvastatin (LIPITOR) 20 MG tablet Take 1 tablet (20 mg total) by mouth daily.  ? cholecalciferol (VITAMIN D-400) 10 MCG (400 UNIT) TABS tablet Take 1 tablet (400 Units total) by mouth daily.  ? gabapentin (NEURONTIN) 800 MG tablet Take 1 tablet (800 mg total) by mouth at bedtime.  ? GARLIC PO Take 1 capsule by mouth daily.  ? glucose blood test strip Use as instructed 3 times daily  ? ibuprofen (ADVIL) 600 MG tablet Take 1 tablet (600 mg total) by mouth every 6 (six) hours as needed for moderate pain.  ? insulin aspart (NOVOLOG FLEXPEN) 100 UNIT/ML FlexPen  Inject 28 Units into the skin 3 (three) times daily with meals.  ? Insulin Glargine (BASAGLAR KWIKPEN) 100 UNIT/ML Inject 54 Units into the skin at bedtime.  ? Insulin Pen Needle (B-D UF III MINI PEN NEEDLES) 31G X 5 MM MISC USE AS DIRECTED  ? levETIRAcetam (KEPPRA XR) 500 MG 24 hr tablet Take 1 tablet (500 mg total) by mouth daily.  ? lisinopril (ZESTRIL) 10 MG tablet Take 1 tablet (10 mg total) by mouth daily.  ? Multiple Vitamin (MULTIVITAMIN WITH MINERALS) TABS tablet Take 1 tablet by mouth daily. Reported on 11/13/2015  ? ONETOUCH DELICA LANCETS 33G MISC 1 each by Does not apply route 3 (three) times daily.  ? SUPER B COMPLEX/C PO Take 1 capsule by mouth daily.  ? Continuous  Blood Gluc Receiver (FREESTYLE LIBRE 14 DAY READER) DEVI 1 Device by Does not apply route continuous.  ? diazepam (VALIUM) 5 MG tablet Take 0.5 tablets (2.5 mg total) by mouth every 6 (six) hours as needed for muscle spasms (leg pain). (Patient not taking: Reported on 01/27/2022)  ? meloxicam (MOBIC) 7.5 MG tablet Take 1 tablet (7.5 mg total) by mouth daily. (Patient not taking: Reported on 01/27/2022)  ? Misc Natural Products (APPLE CIDER VINEGAR DIET PO) Take 1 capsule by mouth daily. (Patient not taking: Reported on 01/27/2022)  ? [DISCONTINUED] omeprazole (PRILOSEC) 20 MG capsule TAKE 1 CAPSULE (20 MG TOTAL) BY MOUTH DAILY. (Patient not taking: Reported on 05/11/2020)  ? [DISCONTINUED] promethazine (PHENERGAN) 12.5 MG tablet Take 1 tablet (12.5 mg total) by mouth daily as needed for nausea or vomiting. (Patient not taking: Reported on 05/11/2020)  ? [DISCONTINUED] sildenafil (VIAGRA) 50 MG tablet Take 0.5-1 tablets (25-50 mg total) by mouth daily as needed for erectile dysfunction. (Patient not taking: Reported on 05/11/2020)  ? [DISCONTINUED] sitaGLIPtin-metformin (JANUMET) 50-1000 MG tablet Take 1 tablet by mouth 2 (two) times daily with a meal. (Patient not taking: Reported on 05/11/2020)  ? ?No facility-administered encounter medications on file as of 01/27/2022.  ? ? ? ?Review of Systems ? ?Review of Systems  ?Constitutional: Negative.   ?HENT: Negative.    ?Cardiovascular: Negative.   ?Gastrointestinal: Negative.   ?Allergic/Immunologic: Negative.   ?Neurological: Negative.   ?Psychiatric/Behavioral: Negative.     ? ? ? ?Physical Exam ? ?BP (!) 153/87 (BP Location: Right Arm, Patient Position: Sitting, Cuff Size: Large)   Pulse 82   Temp 97.9 ?F (36.6 ?C)   Resp 20   Ht 5\' 8"  (1.727 m)   Wt 252 lb 3.2 oz (114.4 kg)   SpO2 99%   BMI 38.35 kg/m?  ? ?Wt Readings from Last 5 Encounters:  ?01/27/22 252 lb 3.2 oz (114.4 kg)  ?12/02/21 260 lb (117.9 kg)  ?08/23/20 267 lb 9.6 oz (121.4 kg)  ?06/15/20 261 lb (118.4 kg)   ?05/23/20 (!) 264 lb (119.7 kg)  ? ? ? ?Physical Exam ?Vitals and nursing note reviewed.  ?Constitutional:   ?   General: He is not in acute distress. ?   Appearance: He is well-developed.  ?Cardiovascular:  ?   Rate and Rhythm: Normal rate and regular rhythm.  ?Pulmonary:  ?   Effort: Pulmonary effort is normal.  ?   Breath sounds: Normal breath sounds.  ?Skin: ?   General: Skin is warm and dry.  ?Neurological:  ?   Mental Status: He is alert and oriented to person, place, and time.  ? ? ? ? ?Assessment & Plan:  ? ?Health care maintenance ?- POCT  glycosylated hemoglobin (Hb A1C) ?- POCT URINALYSIS DIP (CLINITEK) ?- CBC ?- Comprehensive metabolic panel ?- TSH ?- Lipid Panel ? ?2. Primary hypertension ? ?Low salt diet ? ? ?3. Hyperlipidemia associated with type 2 diabetes mellitus (HCC) ? ?Diabetic diet ? ? ?4. Colon cancer screening ? ?- Ambulatory referral to Gastroenterology ? ?5. Diabetic foot care ? ?- Ambulatory referral to Podiatry ? ?6. Erectile dysfunction, unspecified erectile dysfunction type ? ?- Ambulatory referral to Urology ? ?7. STD screening ? ?Will restart current medications; ? ?Current Outpatient Medications on File Prior to Visit  ?Medication Sig Dispense Refill  ? amLODipine (NORVASC) 10 MG tablet Take 1 tablet (10 mg total) by mouth daily. 90 tablet 3  ? aspirin EC 81 MG tablet Take 1 tablet (81 mg total) by mouth daily. 100 tablet 1  ? atorvastatin (LIPITOR) 20 MG tablet Take 1 tablet (20 mg total) by mouth daily. 90 tablet 3  ? cholecalciferol (VITAMIN D-400) 10 MCG (400 UNIT) TABS tablet Take 1 tablet (400 Units total) by mouth daily. 100 tablet 0  ? gabapentin (NEURONTIN) 800 MG tablet Take 1 tablet (800 mg total) by mouth at bedtime. 90 tablet 3  ? GARLIC PO Take 1 capsule by mouth daily.    ? glucose blood test strip Use as instructed 3 times daily 100 each 2  ? ibuprofen (ADVIL) 600 MG tablet Take 1 tablet (600 mg total) by mouth every 6 (six) hours as needed for moderate pain. 30  tablet 0  ? insulin aspart (NOVOLOG FLEXPEN) 100 UNIT/ML FlexPen Inject 28 Units into the skin 3 (three) times daily with meals. 75.6 mL 3  ? Insulin Glargine (BASAGLAR KWIKPEN) 100 UNIT/ML Inject 54 Units into t

## 2022-01-28 LAB — CBC
Hematocrit: 40.4 % (ref 37.5–51.0)
Hemoglobin: 14.1 g/dL (ref 13.0–17.7)
MCH: 32.6 pg (ref 26.6–33.0)
MCHC: 34.9 g/dL (ref 31.5–35.7)
MCV: 93 fL (ref 79–97)
Platelets: 246 10*3/uL (ref 150–450)
RBC: 4.33 x10E6/uL (ref 4.14–5.80)
RDW: 11.9 % (ref 11.6–15.4)
WBC: 4 10*3/uL (ref 3.4–10.8)

## 2022-01-28 LAB — COMPREHENSIVE METABOLIC PANEL
ALT: 27 IU/L (ref 0–44)
AST: 20 IU/L (ref 0–40)
Albumin/Globulin Ratio: 1.7 (ref 1.2–2.2)
Albumin: 4.3 g/dL (ref 4.0–5.0)
Alkaline Phosphatase: 79 IU/L (ref 44–121)
BUN/Creatinine Ratio: 17 (ref 9–20)
BUN: 15 mg/dL (ref 6–24)
Bilirubin Total: 0.3 mg/dL (ref 0.0–1.2)
CO2: 24 mmol/L (ref 20–29)
Calcium: 9.7 mg/dL (ref 8.7–10.2)
Chloride: 104 mmol/L (ref 96–106)
Creatinine, Ser: 0.87 mg/dL (ref 0.76–1.27)
Globulin, Total: 2.5 g/dL (ref 1.5–4.5)
Glucose: 262 mg/dL — ABNORMAL HIGH (ref 70–99)
Potassium: 3.9 mmol/L (ref 3.5–5.2)
Sodium: 141 mmol/L (ref 134–144)
Total Protein: 6.8 g/dL (ref 6.0–8.5)
eGFR: 105 mL/min/{1.73_m2} (ref >59)

## 2022-01-28 LAB — LIPID PANEL
Chol/HDL Ratio: 2.9 ratio (ref 0.0–5.0)
Cholesterol, Total: 113 mg/dL (ref 100–199)
HDL: 39 mg/dL — ABNORMAL LOW (ref >39)
LDL Chol Calc (NIH): 59 mg/dL (ref 0–99)
Triglycerides: 75 mg/dL (ref 0–149)
VLDL Cholesterol Cal: 15 mg/dL (ref 5–40)

## 2022-01-28 LAB — TSH: TSH: 1.29 u[IU]/mL (ref 0.450–4.500)

## 2022-01-29 LAB — RPR+HIV+GC+CT PANEL
HIV Screen 4th Generation wRfx: NONREACTIVE
RPR Ser Ql: NONREACTIVE

## 2022-02-05 ENCOUNTER — Encounter (INDEPENDENT_AMBULATORY_CARE_PROVIDER_SITE_OTHER): Payer: BC Managed Care – PPO | Admitting: Ophthalmology

## 2022-02-19 ENCOUNTER — Encounter (INDEPENDENT_AMBULATORY_CARE_PROVIDER_SITE_OTHER): Payer: Self-pay | Admitting: Ophthalmology

## 2022-02-19 ENCOUNTER — Ambulatory Visit (INDEPENDENT_AMBULATORY_CARE_PROVIDER_SITE_OTHER): Payer: BC Managed Care – PPO | Admitting: Ophthalmology

## 2022-02-19 DIAGNOSIS — E113591 Type 2 diabetes mellitus with proliferative diabetic retinopathy without macular edema, right eye: Secondary | ICD-10-CM

## 2022-02-19 DIAGNOSIS — E113592 Type 2 diabetes mellitus with proliferative diabetic retinopathy without macular edema, left eye: Secondary | ICD-10-CM | POA: Diagnosis not present

## 2022-02-19 DIAGNOSIS — H02402 Unspecified ptosis of left eyelid: Secondary | ICD-10-CM | POA: Diagnosis not present

## 2022-02-19 DIAGNOSIS — H211X2 Other vascular disorders of iris and ciliary body, left eye: Secondary | ICD-10-CM

## 2022-02-19 DIAGNOSIS — E11311 Type 2 diabetes mellitus with unspecified diabetic retinopathy with macular edema: Secondary | ICD-10-CM

## 2022-02-19 DIAGNOSIS — H2513 Age-related nuclear cataract, bilateral: Secondary | ICD-10-CM | POA: Insufficient documentation

## 2022-02-19 NOTE — Assessment & Plan Note (Signed)
Not seen today

## 2022-02-19 NOTE — Assessment & Plan Note (Addendum)
OS, still active with vitreous hemorrhage and preretinal hemorrhage inferiorly.  Will need completion of PRP inferiorly ? ?Likely will need vitrectomy to release the traction in the future but uncertain if we can stabilize and bring the condition into quiescent's with combination PRP and/or injectable medications antivegF ? ?OS progressive disease over the last 14 months, due to lack of follow-up and evaluation. ? ?Nonetheless preretinal hemorrhage inferiorly, will need to complete PRP OS inferiorly intranasally. ?

## 2022-02-19 NOTE — Assessment & Plan Note (Signed)
Active as well PDR temporally will need completion of PRP likely to control the right eye performed for preretinal vitreous hemorrhage develops ?

## 2022-02-19 NOTE — Assessment & Plan Note (Signed)
Visually not significant at this time but however if posterior segment surgery were to ever be undertaken would need cataract surgery with lens implantation first.  We will continue to monitor closely ?

## 2022-02-19 NOTE — Assessment & Plan Note (Signed)
Stable OS 

## 2022-02-19 NOTE — Progress Notes (Signed)
? ? ?02/19/2022 ? ?  ? ?CHIEF COMPLAINT ?Patient presents for  ?Chief Complaint  ?Patient presents with  ? Diabetic Retinopathy with Macular Edema  ? ? ? ? ?HISTORY OF PRESENT ILLNESS: ?Albert Garcia is a 51 y.o. male who presents to the clinic today for:  ? ?HPI   ?1 yr fu fp ou. ?Pt stated, " My left eye feels like a black spot and that happened a year ago. My A1C is at a 10 and im working to get that down." ?Pt confirms he is seeing blurry vision. ?Pt sees FOL in left eye.  ?Last edited by Angeline Slim on 02/19/2022  8:48 AM.  ?  ? ? ?Referring physician: ?Olivia Canter, MD ?[address] ?[address] ?[place],  [place] [address] ? ?HISTORICAL INFORMATION:  ? ?Selected notes from the MEDICAL RECORD NUMBER ?  ? ?Lab Results  ?Component Value Date  ? HGBA1C 10.2 (A) 01/27/2022  ? HGBA1C 10.2 01/27/2022  ? HGBA1C 10.2 (A) 01/27/2022  ? HGBA1C 10.2 (A) 01/27/2022  ?  ? ?CURRENT MEDICATIONS: ?No current outpatient medications on file. (Ophthalmic Drugs)  ? ?No current facility-administered medications for this visit. (Ophthalmic Drugs)  ? ?Current Outpatient Medications (Other)  ?Medication Sig  ? amLODipine (NORVASC) 10 MG tablet Take 1 tablet (10 mg total) by mouth daily.  ? aspirin EC 81 MG tablet Take 1 tablet (81 mg total) by mouth daily.  ? atorvastatin (LIPITOR) 20 MG tablet Take 1 tablet (20 mg total) by mouth daily.  ? cholecalciferol (VITAMIN D-400) 10 MCG (400 UNIT) TABS tablet Take 1 tablet (400 Units total) by mouth daily.  ? Continuous Blood Gluc Receiver (FREESTYLE LIBRE 14 DAY READER) DEVI 1 Device by Does not apply route continuous.  ? diazepam (VALIUM) 5 MG tablet Take 0.5 tablets (2.5 mg total) by mouth every 6 (six) hours as needed for muscle spasms (leg pain). (Patient not taking: Reported on 01/27/2022)  ? gabapentin (NEURONTIN) 800 MG tablet Take 1 tablet (800 mg total) by mouth at bedtime.  ? GARLIC PO Take 1 capsule by mouth daily.  ? glucose blood test strip Use as instructed 3 times daily  ? ibuprofen (ADVIL)  600 MG tablet Take 1 tablet (600 mg total) by mouth every 6 (six) hours as needed for moderate pain.  ? insulin aspart (NOVOLOG FLEXPEN) 100 UNIT/ML FlexPen Inject 28 Units into the skin 3 (three) times daily with meals.  ? Insulin Glargine (BASAGLAR KWIKPEN) 100 UNIT/ML Inject 54 Units into the skin at bedtime.  ? Insulin Pen Needle (B-D UF III MINI PEN NEEDLES) 31G X 5 MM MISC USE AS DIRECTED  ? levETIRAcetam (KEPPRA XR) 500 MG 24 hr tablet Take 1 tablet (500 mg total) by mouth daily.  ? lisinopril (ZESTRIL) 10 MG tablet Take 1 tablet (10 mg total) by mouth daily.  ? meloxicam (MOBIC) 7.5 MG tablet Take 1 tablet (7.5 mg total) by mouth daily. (Patient not taking: Reported on 01/27/2022)  ? Misc Natural Products (APPLE CIDER VINEGAR DIET PO) Take 1 capsule by mouth daily. (Patient not taking: Reported on 01/27/2022)  ? Multiple Vitamin (MULTIVITAMIN WITH MINERALS) TABS tablet Take 1 tablet by mouth daily. Reported on 11/13/2015  ? ONETOUCH DELICA LANCETS 33G MISC 1 each by Does not apply route 3 (three) times daily.  ? SUPER B COMPLEX/C PO Take 1 capsule by mouth daily.  ? ?No current facility-administered medications for this visit. (Other)  ? ? ? ? ?REVIEW OF SYSTEMS: ?ROS   ?  Negative for: Constitutional, Gastrointestinal, Neurological, Skin, Genitourinary, Musculoskeletal, HENT, Endocrine, Cardiovascular, Eyes, Respiratory, Psychiatric, Allergic/Imm, Heme/Lymph ?Last edited by Angeline SlimMa, [place] on 02/19/2022  8:48 AM.  ?  ? ? ? ?ALLERGIES ?Allergies  ?Allergen Reactions  ? Hctz [Hydrochlorothiazide]   ?  Causes ED   ? ? ?PAST MEDICAL HISTORY ?Past Medical History:  ?Diagnosis Date  ? Diabetes mellitus   ? Erectile dysfunction   ? Headache   ? Hypertension   ? Seizures (HCC)   ? ?Past Surgical History:  ?Procedure Laterality Date  ? NO PAST SURGERIES    ? REFRACTIVE SURGERY Bilateral 04/2018  ? ? ?FAMILY HISTORY ?Family History  ?Problem Relation Age of Onset  ? Hypertension Mother   ? Diabetes Mother   ? Lupus Mother   ? Kidney  failure Mother   ? Diabetes Father   ? Hypertension Father   ? Kidney failure Father   ? ? ?SOCIAL HISTORY ?Social History  ? ?Tobacco Use  ? Smoking status: Never  ? Smokeless tobacco: Never  ?Vaping Use  ? Vaping Use: Never used  ?Substance Use Topics  ? Alcohol use: Yes  ?  Alcohol/week: 0.0 standard drinks  ?  Comment: occ  ? Drug use: No  ? ?  ? ?  ? ?OPHTHALMIC EXAM: ? ?Base Eye Exam   ? ? Visual Acuity (ETDRS)   ? ?   Right Left  ? Dist [place] 20/25 20/50 -1  ? Dist ph [place]  20/40  ? ?  ?  ? ? Tonometry (Tonopen, 8:54 AM)   ? ?   Right Left  ? Pressure 18 24  ? ?  ?  ? ? Pupils   ? ?   Pupils Dark Light Shape React APD  ? Right PERRL 3 3 Round Brisk None  ? Left PERRL 3 3 Round Brisk None  ? ?  ?  ? ? Visual Fields   ? ?   Left Right  ?  Full Full  ? ?  ?  ? ? Extraocular Movement   ? ?   Right Left  ?  Full Full  ? ?  ?  ? ? Neuro/Psych   ? ? Oriented x3: Yes  ? Mood/Affect: Normal  ? ?  ?  ? ? Dilation   ? ? Both eyes: 1.0% Mydriacyl, 2.5% Phenylephrine @ 8:54 AM  ? ?  ?  ? ?  ? ?Slit Lamp and Fundus Exam   ? ? External Exam   ? ?   Right Left  ? External Normal Normal  ? ?  ?  ? ? Slit Lamp Exam   ? ?   Right Left  ? Lids/Lashes Normal levator aponeurosis dehiscence.  Full function is noted on eyelid elevation, asymmetric lid crease OS higher than OD  ? Conjunctiva/Sclera White and quiet White and quiet  ? Cornea Clear Clear  ? Anterior Chamber Deep and quiet Deep and quiet  ? Iris Round and reactive Neovascularization, .  Less active yet still present, on dilated exam pupillary  ? Lens 2+ Nuclear sclerosis, 2+ Cortical cataract 2+ Nuclear sclerosis, 2+ Cortical cataract  ? Anterior Vitreous Normal Normal  ? ?  ?  ? ? Fundus Exam   ? ?   Right Left  ? Posterior Vitreous Small preretinal hemorrhage inferior Vitt heme Pre-retinal hemorrhage, inferior, ITRK  ? Disc Normal Neovascularization smaller than 10-c, present but smaller than last examination dated January 2022, when this area was greater than  standard  photo 10-C, now as compared to previous exam  ? C/D Ratio 0.4 0.2  ? Macula Microaneurysms, no clinically significant macular edema Microaneurysms, no clinically significant macular edema  ? Vessels PDR-active temporally PDR-active  ? Periphery Neovascularization, scatter PRP nasally, temporally and superiorly, room inferiorly for completion of PRP, will need completion of PRP temporally OD Neovascularization, with  scatter superonasally, temporally  PRP, will need completion inferiorly  ? ?  ?  ? ?  ? ? ?IMAGING AND PROCEDURES  ?Imaging and Procedures for 02/19/22 ? ?Color Fundus Photography Optos - OU - Both Eyes   ? ?   ?Right Eye ?Progression has improved. Disc findings include normal observations. Macula : microaneurysms. Vessels : Neovascularization.  ? ?Left Eye ?Progression has improved. Disc findings include increased cup to disc ratio, neovascularization. Macula : microaneurysms. Vessels : Neovascularization.  ? ?Notes ?PDR OD now active again with enlarging NVE inferonasal, nasal and superior along ST arcade, needs peripheral PRP temporally to complete treatment of the right eye ? ? ?OS progressive disease over the last 14 months, due to lack of follow-up and evaluation. ? ?Nonetheless preretinal hemorrhage inferiorly, will need to complete PRP OS inferiorly intranasally. ? ?  ? ? ?  ?  ? ?  ?ASSESSMENT/PLAN: ? 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  Will need completion of PRP inferiorly ? ?Likely will need vitrectomy to release the traction in the future but uncertain if we can stabilize and bring the condition into quiescent's with combination PRP and/or injectable medications antivegF ? ?OS progressive disease over the last 14 months, due to lack of follow-up and evaluation. ? ?Nonetheless preretinal hemorrhage inferiorly, will need to complete PRP OS  inferiorly intranasally. ? ?Proliferative diabetic retinopathy of right eye (HCC) ?Active as well PDR temporally will need completion of PRP likely to control the right eye performed for preretinal vitreous

## 2022-02-27 ENCOUNTER — Encounter (INDEPENDENT_AMBULATORY_CARE_PROVIDER_SITE_OTHER): Payer: BC Managed Care – PPO | Admitting: Ophthalmology

## 2022-03-16 LAB — HM DIABETES EYE EXAM

## 2022-04-08 ENCOUNTER — Other Ambulatory Visit: Payer: Self-pay

## 2022-04-08 ENCOUNTER — Encounter (HOSPITAL_COMMUNITY): Payer: Self-pay | Admitting: Emergency Medicine

## 2022-04-08 ENCOUNTER — Emergency Department (HOSPITAL_COMMUNITY)
Admission: EM | Admit: 2022-04-08 | Discharge: 2022-04-08 | Disposition: A | Discharge status: Home / Self Care | Payer: BC Managed Care – PPO | Attending: Emergency Medicine | Admitting: Emergency Medicine

## 2022-04-08 DIAGNOSIS — Z794 Long term (current) use of insulin: Secondary | ICD-10-CM | POA: Insufficient documentation

## 2022-04-08 DIAGNOSIS — I10 Essential (primary) hypertension: Secondary | ICD-10-CM | POA: Diagnosis not present

## 2022-04-08 DIAGNOSIS — Z79899 Other long term (current) drug therapy: Secondary | ICD-10-CM | POA: Diagnosis not present

## 2022-04-08 DIAGNOSIS — H5712 Ocular pain, left eye: Secondary | ICD-10-CM | POA: Diagnosis not present

## 2022-04-08 DIAGNOSIS — Z7982 Long term (current) use of aspirin: Secondary | ICD-10-CM | POA: Diagnosis not present

## 2022-04-08 DIAGNOSIS — E119 Type 2 diabetes mellitus without complications: Secondary | ICD-10-CM | POA: Insufficient documentation

## 2022-04-08 DIAGNOSIS — Z7984 Long term (current) use of oral hypoglycemic drugs: Secondary | ICD-10-CM | POA: Diagnosis not present

## 2022-04-08 DIAGNOSIS — H538 Other visual disturbances: Secondary | ICD-10-CM | POA: Diagnosis not present

## 2022-04-08 MED ORDER — FLUORESCEIN SODIUM 1 MG OP STRP
1.0000 | ORAL_STRIP | Freq: Once | OPHTHALMIC | Status: AC
  Administered 2022-04-08: 1 via OPHTHALMIC
  Filled 2022-04-08: qty 1

## 2022-04-08 MED ORDER — TETRACAINE HCL 0.5 % OP SOLN
2.0000 [drp] | Freq: Once | OPHTHALMIC | Status: AC
  Administered 2022-04-08: 2 [drp] via OPHTHALMIC
  Filled 2022-04-08 (×2): qty 4

## 2022-04-08 MED ORDER — TETRACAINE HCL 0.5 % OP SOLN
2.0000 [drp] | Freq: Once | OPHTHALMIC | Status: AC
  Administered 2022-04-08: 2 [drp] via OPHTHALMIC
  Filled 2022-04-08: qty 4

## 2022-04-08 NOTE — ED Triage Notes (Signed)
Pt states his left eye has been "bleeding from the inside"becaue he can see black streaks. Pt states his vision is blurry and "forcing itself closed." Pt states 10 years ago a piece of metal went into his eye. He has not had problems since. He had laser eye surgery at the time. Pt has seen his eye doctor but he feels like his vision is worse so he came to the ED

## 2022-04-08 NOTE — ED Provider Notes (Signed)
[facility] EMERGENCY DEPARTMENT Provider Note   CSN: [id number] Arrival date & time: 04/08/22  1431     History PMH: DM2, HTN, HLD, seizures Chief Complaint  Patient presents with   Blurred Vision    Albert Garcia is a 51 y.o. male. Patient presents with worsening blurry vision in the left eye for about 1 week.  He says it has been painful and feels like his eye is throbbing.  He noticed that it acutely worsened today.  He has floaters in the left eye and feeling like there is darkened areas in his visual field.  He feels like his left eye is "forcing itself closed".  He denies any trauma to the eye.  Has no foreign body sensation.  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  He denies any numbness or weakness of extremities, pain in his head, redness of eyes, speech changes, photophobia etc.   HPI     Home Medications Prior to Admission medications   Medication Sig Start Date End Date Taking? Authorizing Provider  amLODipine (NORVASC) 10 MG tablet Take 1 tablet (10 mg total) by mouth daily. 05/03/21   Barbette Merino, NP  aspirin EC 81 MG tablet Take 1 tablet (81 mg total) by mouth daily. 02/01/19   Marcine Matar, MD  atorvastatin (LIPITOR) 20 MG tablet Take 1 tablet (20 mg total) by mouth daily. 05/03/21   Barbette Merino, NP  cholecalciferol (VITAMIN D-400) 10 MCG (400 UNIT) TABS tablet Take 1 tablet (400 Units total) by mouth daily. 04/25/21   Barbette Merino, NP  Continuous Blood Gluc Receiver (FREESTYLE LIBRE 14 DAY READER) DEVI 1 Device by Does not apply route continuous. 05/24/20 05/24/21  Barbette Merino, NP  diazepam (VALIUM) 5 MG tablet Take 0.5 tablets (2.5 mg total) by mouth every 6 (six) hours as needed for muscle spasms (leg pain). Patient not taking: Reported on 01/27/2022 12/03/21   Mesner, Barbara Cower, MD  gabapentin (NEURONTIN) 800 MG tablet Take 1 tablet (800 mg total) by mouth at bedtime. 05/03/21    Barbette Merino, NP  GARLIC PO Take 1 capsule by mouth daily.    [provider]  glucose blood test strip Use as instructed 3 times daily 01/13/18   Hoy Register, MD  ibuprofen (ADVIL) 600 MG tablet Take 1 tablet (600 mg total) by mouth every 6 (six) hours as needed for moderate pain. 05/11/20   Fayrene Helper, PA-C  insulin aspart (NOVOLOG FLEXPEN) 100 UNIT/ML FlexPen Inject 28 Units into the skin 3 (three) times daily with meals. 05/03/21 05/03/22  Barbette Merino, NP  Insulin Glargine (BASAGLAR KWIKPEN) 100 UNIT/ML Inject 54 Units into the skin at bedtime. 05/03/21 05/03/22  Barbette Merino, NP  Insulin Pen Needle (B-D UF III MINI PEN NEEDLES) 31G X 5 MM MISC USE AS DIRECTED 05/03/21   Barbette Merino, NP  levETIRAcetam (KEPPRA XR) 500 MG 24 hr tablet Take 1 tablet (500 mg total) by mouth daily. 05/03/21   Barbette Merino, NP  lisinopril (ZESTRIL) 10 MG tablet Take 1 tablet (10 mg total) by mouth daily. 05/03/21   Barbette Merino, NP  meloxicam (MOBIC) 7.5 MG tablet Take 1 tablet (7.5 mg total) by mouth daily. Patient not taking: Reported on 01/27/2022 12/03/21   Mesner, Barbara Cower, MD  Misc Natural Products (APPLE CIDER VINEGAR DIET PO) Take 1 capsule by mouth daily. Patient not taking: Reported on 01/27/2022  [provider]  Multiple Vitamin (MULTIVITAMIN WITH MINERALS) TABS tablet Take 1 tablet by mouth daily. Reported on 11/13/2015    [provider]  [facility] DELICA LANCETS 33G MISC 1 each by Does not apply route 3 (three) times daily. 01/13/18   Hoy Register, MD  SUPER B COMPLEX/C PO Take 1 capsule by mouth daily.    [provider]  omeprazole (PRILOSEC) 20 MG capsule TAKE 1 CAPSULE (20 MG TOTAL) BY MOUTH DAILY. Patient not taking: Reported on 05/11/2020 07/12/19 05/11/20  Marcine Matar, MD  promethazine (PHENERGAN) 12.5 MG tablet Take 1 tablet (12.5 mg total) by mouth daily as needed for nausea or vomiting. Patient not taking: Reported on 05/11/2020 03/25/18 05/11/20   Marcine Matar, MD  sildenafil (VIAGRA) 50 MG tablet Take 0.5-1 tablets (25-50 mg total) by mouth daily as needed for erectile dysfunction. Patient not taking: Reported on 05/11/2020 12/14/17 05/11/20  Lizbeth Bark, FNP  sitaGLIPtin-metformin (JANUMET) 50-1000 MG tablet Take 1 tablet by mouth 2 (two) times daily with a meal. Patient not taking: Reported on 05/11/2020 04/16/20 05/11/20  Marcine Matar, MD      Allergies    Hctz [hydrochlorothiazide]    Review of Systems   Review of Systems  Eyes:  Positive for pain and visual disturbance. Negative for photophobia, discharge and redness.  Neurological:  Negative for speech difficulty, weakness, numbness and headaches.  All other systems reviewed and are negative.   Physical Exam Updated Vital Signs BP (!) 159/96 (BP Location: Right Arm)   Pulse 69   Temp (!) 97.4 F (36.3 C) (Oral)   Resp 16   SpO2 97%  Physical Exam Vitals and nursing note reviewed.  Constitutional:      General: He is not in acute distress.    Appearance: Normal appearance. He is well-developed. He is not ill-appearing, toxic-appearing or diaphoretic.  HENT:     Head: Normocephalic and atraumatic.     Nose: No nasal deformity.     Mouth/Throat:     Lips: Pink. No lesions.  Eyes:     General: Lids are normal. Gaze aligned appropriately. No scleral icterus.       Right eye: No foreign body, discharge or hordeolum.        Left eye: No foreign body, discharge or hordeolum.     Intraocular pressure: Right eye pressure is 23 mmHg. Left eye pressure is 25 mmHg. Measurements were taken using a handheld tonometer.    Extraocular Movements: Extraocular movements intact.     Right eye: Normal extraocular motion and no nystagmus.     Left eye: Normal extraocular motion and no nystagmus.     Conjunctiva/sclera: Conjunctivae normal.     Right eye: Right conjunctiva is not injected. No chemosis, exudate or hemorrhage.    Left eye: Left conjunctiva is not  injected. No chemosis, exudate or hemorrhage.    Pupils: Pupils are equal, round, and reactive to light. Pupils are equal.     Right eye: No corneal abrasion or fluorescein uptake. Seidel exam negative.     Left eye: No corneal abrasion or fluorescein uptake. Seidel exam negative.    Slit lamp exam:    Right eye: No corneal ulcer or photophobia.     Left eye: No corneal ulcer or photophobia.  Pulmonary:     Effort: Pulmonary effort is normal. No respiratory distress.  Skin:    General: Skin is warm and dry.  Neurological:     Mental Status: He  is alert and oriented to person, place, and time.     GCS: GCS eye subscore is 4. GCS verbal subscore is 5. GCS motor subscore is 6.     Sensory: Sensation is intact. No sensory deficit.     Motor: Motor function is intact. No weakness.  Psychiatric:        Mood and Affect: Mood normal.        Speech: Speech normal.        Behavior: Behavior normal. Behavior is cooperative.     ED Results / Procedures / Treatments   Labs (all labs ordered are listed, but only abnormal results are displayed) Labs Reviewed - No data to display  EKG None  Radiology No results found.  Procedures Procedures   Medications Ordered in ED Medications  tetracaine (PONTOCAINE) 0.5 % ophthalmic solution 2 drop (has no administration in time range)  fluorescein ophthalmic strip 1 strip (has no administration in time range)  tetracaine (PONTOCAINE) 0.5 % ophthalmic solution 2 drop (has no administration in time range)    ED Course/ Medical Decision Making/ A&P Clinical Course as of 04/08/22 2258  Tue Apr 08, 2022  2248 Tonopen pressures left eye 25, right eye 23 [GL]    Clinical Course User Index [GL] Claudie LeachLoeffler, Amani Marseille C, PA-C                           Medical Decision Making Risk Prescription drug management.    MDM  This is a 51 y.o. male who presents to the ED with unilateral painful vision loss The differential of this patient includes but is  not limited to neuritis, temporal arteritis, acute angle-closure glaucoma, and doubt balanitis, uveitis  My Impression, Plan, and ED Course:  Patient has equal pressures with 25 in the left eye and 23 in the right eye.  Pain improved with tetracaine but has not resolved.  He has normal pupils that are round equal and reactive.  He has no pain with EOMs.  There is no recent eye trauma. low suspicion for stroke, temporal arteritis, acute angle closure glaucoma, migraine. Visual Acuity ***   I personally ordered, reviewed, and interpreted all laboratory work and imaging and agree with radiologist interpretation. Results interpreted below: ***   ***  Charting Requirements Additional history is obtained from:  Independent historian External Records from outside source obtained and reviewed including: reviewed prior optho note Social Determinants of Health:  none Pertinant PMH that complicates patient's illness: hx of vitreal hemorrhage  Patient Care Problems that were addressed during this visit: - ***: {Problems Addressed:27131} - ***: {Problems Addressed:27131} - ***: {Problems Addressed:27131} This patient was maintained on a cardiac monitor/telemetry. I personally viewed and interpreted the cardiac monitor which reveals an underlying rhythm of *** Medications given in ED: *** Reevaluation of the patient after these medicines showed that the patient {resolved/improved/worsened:23923::"improved"} I have reviewed home medications and made changes accordingly. *** Critical Care Interventions: *** Consultations: *** Disposition: ***  {Shared/Supervised/Independent:27272}  Portions of this note were generated with Dragon dictation software. Dictation errors may occur despite best attempts at proofreading.     Final Clinical Impression(s) / ED Diagnoses Final diagnoses:  None    Rx / DC Orders ED Discharge Orders     None

## 2022-04-08 NOTE — ED Provider Triage Note (Signed)
Emergency Medicine Provider Triage Evaluation Note  Albert Garcia , a 51 y.o. male  was evaluated in triage.  Pt complains of left eye pain and decreased vision.  Patient has history of diabetic retinopathy, and saw the eye doctor on 4/26.  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  He states that this is going to be to expensive so he can get it done.  He is complaining of worsening pain in the left thigh and decreased vision, especially today.  Review of Systems  Positive: Eye pain and decreased vision Negative: Injury  Physical Exam  BP (!) 156/89 (BP Location: Right Arm)   Pulse 87   Temp 98.2 F (36.8 C) (Oral)   Resp 16   SpO2 96%  Gen:   Awake, no distress   Resp:  Normal effort  MSK:   Moves extremities without difficulty  Other:  No conjunctival injection or hemorrhage  Medical Decision Making  Medically screening exam initiated at 3:00 PM.  Appropriate orders placed.  Albert Garcia was informed that the remainder of the evaluation will be completed by another provider, this initial triage assessment does not replace that evaluation, and the importance of remaining in the ED until their evaluation is complete.     Anselma Herbel T, PA-C 04/08/22 1503

## 2022-04-08 NOTE — Discharge Instructions (Signed)
Go to ophthalmology office on [facility] tomorrow morning at 8 AM.  You will be evaluated by an ophthalmologist for the visual disturbance that you are having.

## 2022-04-09 DIAGNOSIS — E113593 Type 2 diabetes mellitus with proliferative diabetic retinopathy without macular edema, bilateral: Secondary | ICD-10-CM | POA: Diagnosis not present

## 2022-04-15 ENCOUNTER — Encounter: Payer: Self-pay | Admitting: Nurse Practitioner

## 2022-04-21 ENCOUNTER — Ambulatory Visit (INDEPENDENT_AMBULATORY_CARE_PROVIDER_SITE_OTHER): Payer: BC Managed Care – PPO | Admitting: Nurse Practitioner

## 2022-04-21 ENCOUNTER — Encounter: Payer: Self-pay | Admitting: Nurse Practitioner

## 2022-04-21 VITALS — BP 137/86 | HR 82 | Temp 97.2°F | Ht 68.0 in | Wt 267.6 lb

## 2022-04-21 DIAGNOSIS — E1142 Type 2 diabetes mellitus with diabetic polyneuropathy: Secondary | ICD-10-CM

## 2022-04-21 DIAGNOSIS — Z794 Long term (current) use of insulin: Secondary | ICD-10-CM | POA: Diagnosis not present

## 2022-04-21 DIAGNOSIS — Z0000 Encounter for general adult medical examination without abnormal findings: Secondary | ICD-10-CM | POA: Diagnosis not present

## 2022-04-21 LAB — POCT URINALYSIS DIP (CLINITEK)
Bilirubin, UA: NEGATIVE
Glucose, UA: NEGATIVE mg/dL
Ketones, POC UA: NEGATIVE mg/dL
Leukocytes, UA: NEGATIVE
Nitrite, UA: NEGATIVE
POC PROTEIN,UA: 100 — AB
Spec Grav, UA: >=1.03 — AB (ref 1.010–1.025)
Urobilinogen, UA: 0.2 E.U./dL
pH, UA: 6 (ref 5.0–8.0)

## 2022-04-21 LAB — GLUCOSE, POCT (MANUAL RESULT ENTRY): POC Glucose: 128 mg/dl — AB (ref 70–99)

## 2022-04-21 LAB — POCT GLYCOSYLATED HEMOGLOBIN (HGB A1C)
HbA1c POC (<> result, manual entry): 8.2 % (ref 4.0–5.6)
HbA1c, POC (controlled diabetic range): 8.2 % — AB (ref 0.0–7.0)
HbA1c, POC (prediabetic range): 8.2 % — AB (ref 5.7–6.4)
Hemoglobin A1C: 8.2 % — AB (ref 4.0–5.6)

## 2022-04-21 NOTE — Progress Notes (Signed)
@Patient  ID: Albert Garcia, male    DOB: 12-Oct-1971, 51 y.o.   MRN: [id number]  Chief Complaint  Patient presents with   Follow-up    Pt is here for 3 months DM follow up visit. Pt states he has blurry vision in his left eye    Referring provider: Barbette Merino, NP  HPI  51 year old male with history of diabetes and hypertension.  Patient presents today for diabetic follow-up.  Patient was recently seen in the ED with pain and loss of vision.  He will schedule appointment with ophthalmology.  Patient has followed with ophthalmology and has an appointment coming up appointment for follow-up this Friday.  Patient is taking diabetic medications as prescribed.  He is not currently exercising.  He states that he only eats 1 meal per day.  We discussed that would be better today to 3 small meals per day that are high-protein low-carb.  We will get an A1c and as this is much improved today 8.2.  Patient's blood pressure is well controlled and patient is taking medications as prescribed.  Last visit patient was placed on a referral to GI for colonoscopy for podiatry.  Patient has not been able to get these appointments scheduled yet.  He states that he will call this office as he has appointment scheduled as soon as he can.  Denies f/c/s, n/v/d, hemoptysis, PND, leg swelling Denies chest pain or edema     Allergies  Allergen Reactions   Hctz [Hydrochlorothiazide]     Causes ED     Immunization History  Administered Date(s) Administered   Influenza Inj Mdck Quad Pf 06/30/2018   Influenza Whole 08/19/2006   Influenza,inj,Quad PF,6+ Mos 11/28/2016, 07/13/2019   Influenza-Unspecified 07/23/2017   PFIZER(Purple Top)SARS-COV-2 Vaccination 01/13/2020, 02/07/2020   Pneumococcal Polysaccharide-23 11/27/2004, 11/28/2016   Tdap 01/16/2013    Past Medical History:  Diagnosis Date   Diabetes mellitus    Erectile dysfunction    Headache    Hypertension    Seizures (HCC)     Tobacco  History: Social History   Tobacco Use  Smoking Status Never  Smokeless Tobacco Never   Counseling given: Not Answered   Outpatient Encounter Medications as of 04/21/2022  Medication Sig   amLODipine (NORVASC) 10 MG tablet Take 1 tablet (10 mg total) by mouth daily.   aspirin EC 81 MG tablet Take 1 tablet (81 mg total) by mouth daily.   atorvastatin (LIPITOR) 20 MG tablet Take 1 tablet (20 mg total) by mouth daily.   cholecalciferol (VITAMIN D-400) 10 MCG (400 UNIT) TABS tablet Take 1 tablet (400 Units total) by mouth daily.   gabapentin (NEURONTIN) 800 MG tablet Take 1 tablet (800 mg total) by mouth at bedtime.   GARLIC PO Take 1 capsule by mouth daily.   glucose blood test strip Use as instructed 3 times daily   ibuprofen (ADVIL) 600 MG tablet Take 1 tablet (600 mg total) by mouth every 6 (six) hours as needed for moderate pain.   insulin aspart (NOVOLOG FLEXPEN) 100 UNIT/ML FlexPen Inject 28 Units into the skin 3 (three) times daily with meals.   Insulin Glargine (BASAGLAR KWIKPEN) 100 UNIT/ML Inject 54 Units into the skin at bedtime.   Insulin Pen Needle (B-D UF III MINI PEN NEEDLES) 31G X 5 MM MISC USE AS DIRECTED   levETIRAcetam (KEPPRA XR) 500 MG 24 hr tablet Take 1 tablet (500 mg total) by mouth daily.   lisinopril (ZESTRIL) 10 MG tablet Take 1 tablet (10  mg total) by mouth daily.   Misc Natural Products (APPLE CIDER VINEGAR DIET PO) Take 1 capsule by mouth daily.   Multiple Vitamin (MULTIVITAMIN WITH MINERALS) TABS tablet Take 1 tablet by mouth daily. Reported on 11/13/2015   SUPER B COMPLEX/C PO Take 1 capsule by mouth daily.   Continuous Blood Gluc Receiver (FREESTYLE LIBRE 14 DAY READER) DEVI 1 Device by Does not apply route continuous.   diazepam (VALIUM) 5 MG tablet Take 0.5 tablets (2.5 mg total) by mouth every 6 (six) hours as needed for muscle spasms (leg pain). (Patient not taking: Reported on 01/27/2022)   meloxicam (MOBIC) 7.5 MG tablet Take 1 tablet (7.5 mg total) by  mouth daily. (Patient not taking: Reported on 01/27/2022)   ONETOUCH DELICA LANCETS 33G MISC 1 each by Does not apply route 3 (three) times daily.   [DISCONTINUED] omeprazole (PRILOSEC) 20 MG capsule TAKE 1 CAPSULE (20 MG TOTAL) BY MOUTH DAILY. (Patient not taking: Reported on 05/11/2020)   [DISCONTINUED] promethazine (PHENERGAN) 12.5 MG tablet Take 1 tablet (12.5 mg total) by mouth daily as needed for nausea or vomiting. (Patient not taking: Reported on 05/11/2020)   [DISCONTINUED] sildenafil (VIAGRA) 50 MG tablet Take 0.5-1 tablets (25-50 mg total) by mouth daily as needed for erectile dysfunction. (Patient not taking: Reported on 05/11/2020)   [DISCONTINUED] sitaGLIPtin-metformin (JANUMET) 50-1000 MG tablet Take 1 tablet by mouth 2 (two) times daily with a meal. (Patient not taking: Reported on 05/11/2020)   No facility-administered encounter medications on file as of 04/21/2022.     Review of Systems  Review of Systems  Constitutional: Negative.   HENT: Negative.    Eyes:  Positive for pain and visual disturbance.  Cardiovascular: Negative.   Gastrointestinal: Negative.   Allergic/Immunologic: Negative.   Neurological: Negative.   Psychiatric/Behavioral: Negative.         Physical Exam  BP 137/86 (BP Location: Right Arm, Patient Position: Sitting, Cuff Size: Large)   Pulse 82   Temp (!) 97.2 F (36.2 C)   Ht 5\' 8"  (1.727 m)   Wt 267 lb 9.6 oz (121.4 kg)   SpO2 99%   BMI 40.69 kg/m   Wt Readings from Last 5 Encounters:  04/21/22 267 lb 9.6 oz (121.4 kg)  01/27/22 252 lb 3.2 oz (114.4 kg)  12/02/21 260 lb (117.9 kg)  08/23/20 267 lb 9.6 oz (121.4 kg)  06/15/20 261 lb (118.4 kg)     Physical Exam Vitals and nursing note reviewed.  Constitutional:      General: He is not in acute distress.    Appearance: He is well-developed.  Eyes:     Pupils: Pupils are equal, round, and reactive to light.  Cardiovascular:     Rate and Rhythm: Normal rate and regular rhythm.   Pulmonary:     Effort: Pulmonary effort is normal.     Breath sounds: Normal breath sounds.  Skin:    General: Skin is warm and dry.  Neurological:     Mental Status: He is alert and oriented to person, place, and time.  Psychiatric:        Mood and Affect: Mood normal.        Behavior: Behavior normal.       Assessment & Plan:   Type 2 diabetes mellitus with diabetic polyneuropathy, with long-term current use of insulin (HCC) - POCT glycosylated hemoglobin (Hb A1C) - POCT glucose (manual entry) - CBC - Comprehensive metabolic panel - TSH  Lab Results  Component Value Date  HGBA1C 8.2 (A) 04/21/2022   HGBA1C 8.2 04/21/2022   HGBA1C 8.2 (A) 04/21/2022   HGBA1C 8.2 (A) 04/21/2022     2. Healthcare maintenance  - TSH - PSA   Follow up:  Follow up in 3 months or sooner if needed     Ivonne Andrew, NP 04/21/2022

## 2022-04-22 LAB — COMPREHENSIVE METABOLIC PANEL
ALT: 41 IU/L (ref 0–44)
AST: 23 IU/L (ref 0–40)
Albumin/Globulin Ratio: 1.5 (ref 1.2–2.2)
Albumin: 4.3 g/dL (ref 4.0–5.0)
Alkaline Phosphatase: 77 IU/L (ref 44–121)
BUN/Creatinine Ratio: 16 (ref 9–20)
BUN: 13 mg/dL (ref 6–24)
Bilirubin Total: 0.9 mg/dL (ref 0.0–1.2)
CO2: 25 mmol/L (ref 20–29)
Calcium: 10.2 mg/dL (ref 8.7–10.2)
Chloride: 102 mmol/L (ref 96–106)
Creatinine, Ser: 0.8 mg/dL (ref 0.76–1.27)
Globulin, Total: 2.8 g/dL (ref 1.5–4.5)
Glucose: 112 mg/dL — ABNORMAL HIGH (ref 70–99)
Potassium: 4.2 mmol/L (ref 3.5–5.2)
Sodium: 143 mmol/L (ref 134–144)
Total Protein: 7.1 g/dL (ref 6.0–8.5)
eGFR: 108 mL/min/{1.73_m2} (ref >59)

## 2022-04-22 LAB — CBC
Hematocrit: 40 % (ref 37.5–51.0)
Hemoglobin: 14.1 g/dL (ref 13.0–17.7)
MCH: 32.6 pg (ref 26.6–33.0)
MCHC: 35.3 g/dL (ref 31.5–35.7)
MCV: 92 fL (ref 79–97)
Platelets: 250 10*3/uL (ref 150–450)
RBC: 4.33 x10E6/uL (ref 4.14–5.80)
RDW: 12.2 % (ref 11.6–15.4)
WBC: 4 10*3/uL (ref 3.4–10.8)

## 2022-04-22 LAB — PSA: Prostate Specific Ag, Serum: 0.5 ng/mL (ref 0.0–4.0)

## 2022-04-22 LAB — HEMOGLOBIN A1C
Est. average glucose Bld gHb Est-mCnc: 183 mg/dL
Hgb A1c MFr Bld: 8 % — ABNORMAL HIGH (ref 4.8–5.6)

## 2022-04-22 LAB — TSH: TSH: 2.67 u[IU]/mL (ref 0.450–4.500)

## 2022-04-25 DIAGNOSIS — E113532 Type 2 diabetes mellitus with proliferative diabetic retinopathy with traction retinal detachment not involving the macula, left eye: Secondary | ICD-10-CM | POA: Diagnosis not present

## 2022-04-28 ENCOUNTER — Ambulatory Visit: Payer: Self-pay | Admitting: Nurse Practitioner

## 2022-05-13 ENCOUNTER — Other Ambulatory Visit: Payer: Self-pay | Admitting: Nurse Practitioner

## 2022-05-13 DIAGNOSIS — I10 Essential (primary) hypertension: Secondary | ICD-10-CM

## 2022-05-13 DIAGNOSIS — E1142 Type 2 diabetes mellitus with diabetic polyneuropathy: Secondary | ICD-10-CM

## 2022-05-14 DIAGNOSIS — E113532 Type 2 diabetes mellitus with proliferative diabetic retinopathy with traction retinal detachment not involving the macula, left eye: Secondary | ICD-10-CM | POA: Diagnosis not present

## 2022-06-09 DIAGNOSIS — H25812 Combined forms of age-related cataract, left eye: Secondary | ICD-10-CM | POA: Diagnosis not present

## 2022-06-11 DIAGNOSIS — E113532 Type 2 diabetes mellitus with proliferative diabetic retinopathy with traction retinal detachment not involving the macula, left eye: Secondary | ICD-10-CM | POA: Diagnosis not present

## 2022-07-10 ENCOUNTER — Other Ambulatory Visit: Payer: Self-pay | Admitting: Nurse Practitioner

## 2022-07-10 DIAGNOSIS — Z794 Long term (current) use of insulin: Secondary | ICD-10-CM

## 2022-07-10 DIAGNOSIS — I10 Essential (primary) hypertension: Secondary | ICD-10-CM

## 2022-07-18 DIAGNOSIS — E113532 Type 2 diabetes mellitus with proliferative diabetic retinopathy with traction retinal detachment not involving the macula, left eye: Secondary | ICD-10-CM | POA: Diagnosis not present

## 2022-07-21 ENCOUNTER — Other Ambulatory Visit: Payer: Self-pay

## 2022-07-21 ENCOUNTER — Ambulatory Visit (INDEPENDENT_AMBULATORY_CARE_PROVIDER_SITE_OTHER): Payer: BC Managed Care – PPO | Admitting: Nurse Practitioner

## 2022-07-21 ENCOUNTER — Encounter: Payer: Self-pay | Admitting: Nurse Practitioner

## 2022-07-21 VITALS — BP 140/91 | HR 87 | Temp 97.3°F | Ht 68.0 in | Wt 261.0 lb

## 2022-07-21 DIAGNOSIS — R221 Localized swelling, mass and lump, neck: Secondary | ICD-10-CM

## 2022-07-21 DIAGNOSIS — E1142 Type 2 diabetes mellitus with diabetic polyneuropathy: Secondary | ICD-10-CM

## 2022-07-21 DIAGNOSIS — Z794 Long term (current) use of insulin: Secondary | ICD-10-CM

## 2022-07-21 DIAGNOSIS — G40909 Epilepsy, unspecified, not intractable, without status epilepticus: Secondary | ICD-10-CM

## 2022-07-21 DIAGNOSIS — I10 Essential (primary) hypertension: Secondary | ICD-10-CM | POA: Diagnosis not present

## 2022-07-21 MED ORDER — GABAPENTIN 800 MG PO TABS
800.0000 mg | ORAL_TABLET | Freq: Every day | ORAL | 0 refills | Status: DC
  Filled 2022-07-21: qty 30, 30d supply, fill #0

## 2022-07-21 MED ORDER — LEVETIRACETAM ER 500 MG PO TB24
500.0000 mg | ORAL_TABLET | Freq: Every day | ORAL | 4 refills | Status: DC
  Filled 2022-07-21: qty 30, 30d supply, fill #0
  Filled 2022-08-18 – 2022-09-17 (×2): qty 30, 30d supply, fill #1

## 2022-07-21 MED ORDER — LISINOPRIL 10 MG PO TABS
10.0000 mg | ORAL_TABLET | Freq: Every day | ORAL | 0 refills | Status: DC
  Filled 2022-07-21 – 2022-07-23 (×2): qty 30, 30d supply, fill #0

## 2022-07-21 MED ORDER — AMLODIPINE BESYLATE 10 MG PO TABS
10.0000 mg | ORAL_TABLET | Freq: Every day | ORAL | 0 refills | Status: DC
  Filled 2022-07-21: qty 30, 30d supply, fill #0

## 2022-07-21 MED ORDER — ATORVASTATIN CALCIUM 20 MG PO TABS
20.0000 mg | ORAL_TABLET | Freq: Every day | ORAL | 0 refills | Status: DC
  Filled 2022-07-21: qty 30, 30d supply, fill #0

## 2022-07-21 MED ORDER — BASAGLAR KWIKPEN 100 UNIT/ML ~~LOC~~ SOPN
54.0000 [IU] | PEN_INJECTOR | Freq: Every day | SUBCUTANEOUS | 0 refills | Status: DC
  Filled 2022-07-21: qty 15, 28d supply, fill #0
  Filled 2022-07-23: qty 15, 27d supply, fill #0

## 2022-07-21 NOTE — Assessment & Plan Note (Signed)
-   Insulin Glargine (BASAGLAR KWIKPEN) 100 UNIT/ML; INJECT 54 UNITS SUBCUTANEOUSLY AT BEDTIME  Dispense: 15 mL; Refill: 0 - AMB Referral to Pharmacy Medication Management  Lab Results  Component Value Date   HGBA1C 8.0 (H) 04/21/2022     2. Essential hypertension  - amLODipine (NORVASC) 10 MG tablet; Take 1 tablet (10 mg total) by mouth daily.  Dispense: 30 tablet; Refill: 0 - lisinopril (ZESTRIL) 10 MG tablet; Take 1 tablet (10 mg total) by mouth daily.  Dispense: 30 tablet; Refill: 0  3. Seizure disorder (HCC)  - levETIRAcetam (KEPPRA XR) 500 MG 24 hr tablet; Take 1 tablet (500 mg total) by mouth daily.  Dispense: 90 tablet; Refill: 4   Follow up:  Follow up in 3 months

## 2022-07-21 NOTE — Patient Instructions (Addendum)
1. Type 2 diabetes mellitus with diabetic polyneuropathy, with long-term current use of insulin (HCC)  - Insulin Glargine (BASAGLAR KWIKPEN) 100 UNIT/ML; INJECT 54 UNITS SUBCUTANEOUSLY AT BEDTIME  Dispense: 15 mL; Refill: 0 - AMB Referral to Pharmacy Medication Management  Lab Results  Component Value Date   HGBA1C 8.0 (H) 04/21/2022     2. Essential hypertension  - amLODipine (NORVASC) 10 MG tablet; Take 1 tablet (10 mg total) by mouth daily.  Dispense: 30 tablet; Refill: 0 - lisinopril (ZESTRIL) 10 MG tablet; Take 1 tablet (10 mg total) by mouth daily.  Dispense: 30 tablet; Refill: 0  3. Seizure disorder (HCC)  - levETIRAcetam (KEPPRA XR) 500 MG 24 hr tablet; Take 1 tablet (500 mg total) by mouth daily.  Dispense: 90 tablet; Refill: 4   Follow up:  Follow up in 3 months

## 2022-07-21 NOTE — Progress Notes (Signed)
@Patient  ID: , male    DOB: 08/26/1971, 51 y.o.   MRN: [id number]  Chief Complaint  Patient presents with   Follow-up    Pt is here for 3 month follow up visit. Pt states he needs a refill on all medications    Referring provider: [id number], NP   HPI  51 year old male with history of diabetes and hypertension.  Patient is taking diabetic medications as prescribed.  He is not currently exercising.  He states that he only eats 1 meal per day.  We discussed that would be better today to 3 small meals per day that are high-protein low-carb.  We will get an A1c and as this is much improved today is 10.1. Patient has been out of medications x 4 weeks.   Patient's blood pressure is well controlled and patient is taking medications as prescribed.  Patient complains today of small lump to right side of neck. He states this has been present for a few months. Does not hurt. Will order 44.   Denies f/c/s, n/v/d, hemoptysis, PND, leg swelling Denies chest pain or edema      Allergies  Allergen Reactions   Hctz [Hydrochlorothiazide]     Causes ED     Immunization History  Administered Date(s) Administered   Hepatitis B 06/15/2022   Influenza Inj Mdck Quad Pf 06/30/2018   Influenza Whole 08/19/2006   Influenza,inj,Quad PF,6+ Mos 11/28/2016, 07/13/2019   Influenza-Unspecified 07/23/2017   PFIZER(Purple Top)SARS-COV-2 Vaccination 01/13/2020, 02/07/2020   Pneumococcal Polysaccharide-23 11/27/2004, 11/28/2016   Tdap 01/16/2013   Zoster Recombinat (Shingrix) 05/13/2022    Past Medical History:  Diagnosis Date   Diabetes mellitus    Erectile dysfunction    Headache    Hypertension    Seizures (HCC)     Tobacco History: Social History   Tobacco Use  Smoking Status Never  Smokeless Tobacco Never   Counseling given: Not Answered   Outpatient Encounter Medications as of 07/21/2022  Medication Sig   aspirin EC 81 MG tablet Take 1 tablet (81 mg total) by  mouth daily.   cholecalciferol (VITAMIN D-400) 10 MCG (400 UNIT) TABS tablet Take 1 tablet (400 Units total) by mouth daily.   glucose blood test strip Use as instructed 3 times daily   ibuprofen (ADVIL) 600 MG tablet Take 1 tablet (600 mg total) by mouth every 6 (six) hours as needed for moderate pain.   Insulin Pen Needle (B-D UF III MINI PEN NEEDLES) 31G X 5 MM MISC USE AS DIRECTED   NOVOLOG FLEXPEN 100 UNIT/ML FlexPen INJECT 28 UNITS SUBCUTANEOUSLY THREE TIMES DAILY WITH MEALS   [DISCONTINUED] amLODipine (NORVASC) 10 MG tablet Take 1 tablet by mouth once daily   [DISCONTINUED] atorvastatin (LIPITOR) 20 MG tablet Take 1 tablet by mouth once daily   [DISCONTINUED] gabapentin (NEURONTIN) 800 MG tablet TAKE 1 TABLET BY MOUTH AT BEDTIME   [DISCONTINUED] Insulin Glargine (BASAGLAR KWIKPEN) 100 UNIT/ML INJECT 54 UNITS SUBCUTANEOUSLY AT BEDTIME   [DISCONTINUED] levETIRAcetam (KEPPRA XR) 500 MG 24 hr tablet Take 1 tablet (500 mg total) by mouth daily.   [DISCONTINUED] lisinopril (ZESTRIL) 10 MG tablet Take 1 tablet by mouth once daily   amLODipine (NORVASC) 10 MG tablet Take 1 tablet (10 mg total) by mouth daily.   atorvastatin (LIPITOR) 20 MG tablet Take 1 tablet (20 mg total) by mouth daily.   Continuous Blood Gluc Receiver (FREESTYLE LIBRE 14 DAY READER) DEVI 1 Device by Does not apply route continuous.   diazepam (  VALIUM) 5 MG tablet Take 0.5 tablets (2.5 mg total) by mouth every 6 (six) hours as needed for muscle spasms (leg pain). (Patient not taking: Reported on 01/27/2022)   gabapentin (NEURONTIN) 800 MG tablet Take 1 tablet (800 mg total) by mouth at bedtime.   GARLIC PO Take 1 capsule by mouth daily.   Insulin Glargine (BASAGLAR KWIKPEN) 100 UNIT/ML INJECT 54 UNITS SUBCUTANEOUSLY AT BEDTIME   levETIRAcetam (KEPPRA XR) 500 MG 24 hr tablet Take 1 tablet (500 mg total) by mouth daily.   lisinopril (ZESTRIL) 10 MG tablet Take 1 tablet (10 mg total) by mouth daily.   meloxicam (MOBIC) 7.5 MG  tablet Take 1 tablet (7.5 mg total) by mouth daily. (Patient not taking: Reported on 01/27/2022)   Misc Natural Products (APPLE CIDER VINEGAR DIET PO) Take 1 capsule by mouth daily.   Multiple Vitamin (MULTIVITAMIN WITH MINERALS) TABS tablet Take 1 tablet by mouth daily. Reported on 6/64/4034   [facility] DELICA LANCETS 74Q MISC 1 each by Does not apply route 3 (three) times daily.   SUPER B COMPLEX/C PO Take 1 capsule by mouth daily.   [DISCONTINUED] omeprazole (PRILOSEC) 20 MG capsule TAKE 1 CAPSULE (20 MG TOTAL) BY MOUTH DAILY. (Patient not taking: Reported on 05/11/2020)   [DISCONTINUED] promethazine (PHENERGAN) 12.5 MG tablet Take 1 tablet (12.5 mg total) by mouth daily as needed for nausea or vomiting. (Patient not taking: Reported on 05/11/2020)   [DISCONTINUED] sildenafil (VIAGRA) 50 MG tablet Take 0.5-1 tablets (25-50 mg total) by mouth daily as needed for erectile dysfunction. (Patient not taking: Reported on 05/11/2020)   [DISCONTINUED] sitaGLIPtin-metformin (JANUMET) 50-1000 MG tablet Take 1 tablet by mouth 2 (two) times daily with a meal. (Patient not taking: Reported on 05/11/2020)   No facility-administered encounter medications on file as of 07/21/2022.     Review of Systems  Review of Systems  Constitutional: Negative.   HENT: Negative.    Cardiovascular: Negative.   Gastrointestinal: Negative.   Allergic/Immunologic: Negative.   Neurological: Negative.   Psychiatric/Behavioral: Negative.         Physical Exam  BP (!) 140/91 (BP Location: Left Arm, Patient Position: Sitting, Cuff Size: Large)   Pulse 87   Temp (!) 97.3 F (36.3 C)   Ht 5\' 8"  (1.727 m)   Wt 261 lb (118.4 kg)   SpO2 100%   BMI 39.68 kg/m   Wt Readings from Last 5 Encounters:  07/21/22 261 lb (118.4 kg)  04/21/22 267 lb 9.6 oz (121.4 kg)  01/27/22 252 lb 3.2 oz (114.4 kg)  12/02/21 260 lb (117.9 kg)  08/23/20 267 lb 9.6 oz (121.4 kg)     Physical Exam Vitals and nursing note reviewed.   Constitutional:      General: He is not in acute distress.    Appearance: He is well-developed.  Cardiovascular:     Rate and Rhythm: Normal rate and regular rhythm.  Pulmonary:     Effort: Pulmonary effort is normal.     Breath sounds: Normal breath sounds.  Skin:    General: Skin is warm and dry.  Neurological:     Mental Status: He is alert and oriented to person, place, and time.      Lab Results:  CBC    Component Value Date/Time   WBC 4.0 04/21/2022 0926   WBC 3.8 (L) 05/11/2020 0909   RBC 4.33 04/21/2022 0926   RBC 3.95 (L) 05/11/2020 0909   HGB 14.1 04/21/2022 0926   HCT 40.0 04/21/2022 0926  PLT 250 04/21/2022 0926   MCV 92 04/21/2022 0926   MCH 32.6 04/21/2022 0926   MCH 32.7 05/11/2020 0909   MCHC 35.3 04/21/2022 0926   MCHC 35.3 05/11/2020 0909   RDW 12.2 04/21/2022 0926   LYMPHSABS 2.5 12/06/2017 2310   MONOABS 0.4 12/06/2017 2310   EOSABS 0.1 12/06/2017 2310   BASOSABS 0.0 12/06/2017 2310    BMET    Component Value Date/Time   NA 143 04/21/2022 0926   K 4.2 04/21/2022 0926   CL 102 04/21/2022 0926   CO2 25 04/21/2022 0926   GLUCOSE 112 (H) 04/21/2022 0926   GLUCOSE 233 (H) 05/11/2020 0909   BUN 13 04/21/2022 0926   CREATININE 0.80 04/21/2022 0926   CREATININE 0.81 11/28/2016 1232   CALCIUM 10.2 04/21/2022 0926   GFRNONAA >60 05/11/2020 0909   GFRNONAA >89 11/28/2016 1232   GFRAA >60 05/11/2020 0909   GFRAA >89 11/28/2016 1232    BNP    Component Value Date/Time   BNP 11.0 02/18/2018 1030      Assessment & Plan:   Type 2 diabetes mellitus with diabetic polyneuropathy, with long-term current use of insulin (HCC) - Insulin Glargine (BASAGLAR KWIKPEN) 100 UNIT/ML; INJECT 54 UNITS SUBCUTANEOUSLY AT BEDTIME  Dispense: 15 mL; Refill: 0 - AMB Referral to Pharmacy Medication Management  Lab Results  Component Value Date   HGBA1C 8.0 (H) 04/21/2022     2. Essential hypertension  - amLODipine (NORVASC) 10 MG tablet; Take 1 tablet  (10 mg total) by mouth daily.  Dispense: 30 tablet; Refill: 0 - lisinopril (ZESTRIL) 10 MG tablet; Take 1 tablet (10 mg total) by mouth daily.  Dispense: 30 tablet; Refill: 0  3. Seizure disorder (HCC)  - levETIRAcetam (KEPPRA XR) 500 MG 24 hr tablet; Take 1 tablet (500 mg total) by mouth daily.  Dispense: 90 tablet; Refill: 4   Follow up:  Follow up in 3 months     Ivonne Andrew, NP 07/21/2022

## 2022-07-23 ENCOUNTER — Other Ambulatory Visit: Payer: BC Managed Care – PPO | Admitting: Pharmacist

## 2022-07-23 ENCOUNTER — Telehealth: Payer: Self-pay | Admitting: Pharmacist

## 2022-07-23 ENCOUNTER — Other Ambulatory Visit: Payer: Self-pay

## 2022-07-23 MED ORDER — NOVOLOG FLEXPEN 100 UNIT/ML ~~LOC~~ SOPN
28.0000 [IU] | PEN_INJECTOR | Freq: Three times a day (TID) | SUBCUTANEOUS | 0 refills | Status: DC
  Filled 2022-07-23 (×2): qty 15, 18d supply, fill #0

## 2022-07-23 MED ORDER — BLOOD GLUCOSE MONITOR KIT
PACK | 11 refills | Status: AC
  Filled 2022-07-23: qty 1, 30d supply, fill #0

## 2022-07-23 NOTE — Progress Notes (Signed)
Patient returned call, see note

## 2022-07-23 NOTE — Progress Notes (Signed)
Care Coordination Call  Called patient to schedule referral. Patient notes that he is out of his medications and wasn't sure if he was supposed to pick up the insulins from [facility]. Basaglar was sent to [facility], but Novolog has not been. Collaborated with PCP to re-order to [facility].   Confirmed copays with [facility] - Novolog $35, Basaglar $25, amlodipine, atorvastatin and lisinopril $5 each, gabapentin, $11.07, levetiracetam $10. Patient also requests a glucometer, script sent under Primary Care standing order.   Scheduled phone appointment in 2 weeks  Catie TJodi Mourning, PharmD, [facility] [phone]

## 2022-07-23 NOTE — Progress Notes (Signed)
Contacted patient regarding referral for hypertension and diabetes from Nichols, Tonya S, NP .   Left patient a voicemail to return my call at their convenience. Will also send MyChart  Catie T. Michaelanthony Kempton, PharmD, BCACP  Medical Group [phone]  

## 2022-07-24 ENCOUNTER — Other Ambulatory Visit: Payer: Self-pay

## 2022-07-25 ENCOUNTER — Other Ambulatory Visit: Payer: Self-pay

## 2022-07-25 MED ORDER — TRUEPLUS LANCETS 28G MISC
11 refills | Status: DC
  Filled 2022-07-25: qty 100, 25d supply, fill #0

## 2022-07-25 MED ORDER — TRUE METRIX BLOOD GLUCOSE TEST VI STRP
1.0000 | ORAL_STRIP | Freq: Four times a day (QID) | 11 refills | Status: DC
  Filled 2022-07-25: qty 100, 25d supply, fill #0

## 2022-07-28 ENCOUNTER — Telehealth: Payer: Self-pay | Admitting: Nurse Practitioner

## 2022-07-28 NOTE — Telephone Encounter (Signed)
Patient LVM on nurse line stating he did not receive his test strips for his glucometer with recent order. Requests a call back

## 2022-07-30 ENCOUNTER — Ambulatory Visit (HOSPITAL_COMMUNITY): Admission: RE | Admit: 2022-07-30 | Payer: BC Managed Care – PPO | Source: Ambulatory Visit

## 2022-07-31 ENCOUNTER — Other Ambulatory Visit: Payer: Self-pay | Admitting: Nurse Practitioner

## 2022-07-31 ENCOUNTER — Other Ambulatory Visit: Payer: Self-pay

## 2022-07-31 MED ORDER — TRUE METRIX BLOOD GLUCOSE TEST VI STRP: 1.0000 | ORAL_STRIP | Freq: Four times a day (QID) | 11 refills | Status: AC

## 2022-08-05 ENCOUNTER — Other Ambulatory Visit: Payer: BC Managed Care – PPO | Admitting: Pharmacist

## 2022-08-05 NOTE — Progress Notes (Signed)
08/05/2022 Name: Albert Garcia MRN: [id number] DOB: 1971/01/10  Chief Complaint  Patient presents with   Medication Management   Diabetes   Hypertension   Hyperlipidemia    Albert Garcia is a 51 y.o. year old male who presented for a telephone visit.   They were referred to the pharmacist by their PCP for assistance in managing diabetes.   Subjective:  Care Team: Primary Care Provider: Fenton Foy, NP ; Next Scheduled Visit: 10/17/22  Medication Access/Adherence  [facility] Pharmacy[facility]  [id number] [place] [address]Phone: [phone]: [address]: [phone]: [address]: [phone]: [phone]  Patient reports affordability concerns with their medications: No  Patient reports access/transportation concerns to their pharmacy: No  Patient reports adherence concerns with their medications:  No     Diabetes:  Current medications: Basaglar 54 units daily, Novolog 28 units with meals Medications tried in the past: Janumet - reports this worked well on his blood sugars; notes he saw metformin capsule in his stool and assumed it was not absorbed in his body. Denies intolerance  Current glucose readings: fasting: 190s; 2 hour post prandial ~ 280s  Current physical activity: Reports he hasn't been as active lately with upcoming cataract surgery, but plans to moving forward.   Hypertension:  Current medications: amlodipine 10 mg daily, lisinopril 10 mg daily   Patient does not have a validated, automated, upper arm home BP cuff, but plans on purchasing one   Hyperlipidemia/ASCVD Risk Reduction  Current lipid lowering medications: atorvastatin 20 mg  daily  Antiplatelet regimen: aspirin 81 mg daily  Health Maintenance  Health Maintenance Due  Topic Date Due   COLONOSCOPY (Pts [phone] coverage will need to be confirmed)  Never done   COVID-19 Vaccine (3 - Pfizer risk series) 03/06/2020   Diabetic kidney evaluation - Urine ACR  04/16/2021   FOOT EXAM  04/16/2021   OPHTHALMOLOGY EXAM  05/16/2021   INFLUENZA VACCINE  05/27/2022   Zoster Vaccines- Shingrix (2 of 2) 07/08/2022     Objective: Lab Results  Component Value Date   HGBA1C 8.0 (H) 04/21/2022    Lab Results  Component Value Date   CREATININE 0.80 04/21/2022   BUN 13 04/21/2022   NA 143 04/21/2022   K 4.2 04/21/2022   CL 102 04/21/2022   CO2 25 04/21/2022    Lab Results  Component Value Date   CHOL 113 01/27/2022   HDL 39 (L) 01/27/2022   LDLCALC 59 01/27/2022   TRIG 75 01/27/2022   CHOLHDL 2.9 01/27/2022    Medications Reviewed Today     Reviewed by HaOsker MasonRPH-CPP (Pharmacist) on 08/05/22 at 09Drum Pointist Status: <None>   Medication Order Taking? Sig Documenting Provider Last Dose Status Informant  amLODipine (NORVASC) 10 MG tablet [id number] Take 1 tablet (10 mg total) by mouth daily. NiFenton FoyNP Taking Active   aspirin EC 81 MG tablet [id number] Take 1 tablet (81 mg total) by mouth daily. JoLadell PierMD Taking Active Self  atorvastatin (LIPITOR) 20 MG tablet [id number] Take 1 tablet (20 mg total) by mouth daily. NiFenton FoyNP Taking Active   blood glucose meter  kit and supplies KIT [id number] Yes Dispense based on patient and insurance preference. Use up to four times daily as directed. Albert Foy, NP Taking Active   cholecalciferol (VITAMIN D-400) 10 MCG (400 UNIT) TABS tablet [id number] No Take 1 tablet (400 Units total) by mouth daily.  Patient not taking: Reported on 08/05/2022   Vevelyn Francois, NP Not Taking Active   Continuous Blood Gluc Receiver (FREESTYLE LIBRE 14 DAY READER) DEVI  [id number]  1 Device by Does not apply route continuous. Dionisio David M, NP  Expired 05/24/21 2359   gabapentin (NEURONTIN) 800 MG tablet [id number] Yes Take 1 tablet (800 mg total) by mouth at bedtime. Albert Foy, NP Taking Active   GARLIC PO [id number] No Take 1 capsule by mouth daily.  Patient not taking: Reported on 08/05/2022   [provider] Not Taking Active Self  glucose blood (TRUE METRIX BLOOD GLUCOSE TEST) test strip [id number] Yes Use as directed up to 4 (four) times daily. Albert Foy, NP Taking Active   glucose blood test strip [id number] Yes Use as instructed 3 times daily Charlott Rakes, MD Taking Active Self  Insulin Glargine [facility] KWIKPEN) 100 UNIT/ML [id number] Yes INJECT 54 UNITS SUBCUTANEOUSLY AT BEDTIME Albert Foy, NP Taking Active   Insulin Pen Needle (B-D UF III MINI PEN NEEDLES) 31G X 5 MM MISC [id number]  USE AS DIRECTED Vevelyn Francois, NP  Active   levETIRAcetam (KEPPRA XR) 500 MG 24 hr tablet [id number] Yes Take 1 tablet (500 mg total) by mouth daily. Albert Foy, NP Taking Active   lisinopril (ZESTRIL) 10 MG tablet [id number] Yes Take 1 tablet (10 mg total) by mouth daily. Albert Foy, NP Taking Active   Multiple Vitamin (MULTIVITAMIN WITH MINERALS) TABS tablet [id number] Yes Take 1 tablet by mouth daily. Reported on 11/13/2015 [provider] Taking Active Self  NOVOLOG FLEXPEN 100 UNIT/ML FlexPen [id number] Yes Inject 28 Units into the skin 3 (three) times daily with meals. Albert Foy, NP Taking Active    Patient not taking:   Discontinued 05/11/20 4239 ONETOUCH DELICA LANCETS 53U MISC [id number] Yes 1 each by Does not apply route 3 (three) times daily. Charlott Rakes, MD Taking Active Self   Patient not taking:   Discontinued 05/11/20 1442  Patient not taking:   Discontinued 05/11/20 1442  Patient not taking:   Discontinued 05/11/20 1442 SUPER B COMPLEX/C PO [id number] Yes Take 1 capsule by mouth daily. [provider] Taking Active Self  TRUEplus Lancets 28G [place] [id number] Yes use as directed up to 4 times daily Albert Foy, NP Taking Active   Med List Note Vevelyn Garcia, [place] 08/24/20 1611):                Assessment/Plan:   Diabetes: - Currently uncontrolled - Reviewed long term cardiovascular and renal outcomes of uncontrolled blood sugar - Reviewed goal A1c, goal fasting, and goal 2 hour post prandial glucose - Reviewed dietary modifications including: focus on lean proteins, fruits and vegetables, whole grains/fibers.  - Reviewed lifestyle modifications including: eventual goal of 150 minutes of moderate intensity activity weekly - Discussed cost-benefit of metformin and concept of "ghost capsule" in stool for extended release medications. Recommend to restart metformin with XR 500 mg twice daily. Counseled on risk of diarrhea when first starting. Patient amenable, will discuss with PCP  - Recommend to check glucose twice dialy, fasting and 2 hour post prandial.   Hypertension: - Currently unknown conrol -  Reviewed long term cardiovascular and renal outcomes of uncontrolled blood pressure - Reviewed appropriate blood pressure monitoring technique and reviewed goal blood pressure. Encouraged to consider purchasing home upper arm automated BP cuff, specifically Omron brand if able - Recommend to continue current regimen at this time    Hyperlipidemia/ASCVD Risk Reduction: - Currently controlled per last lipid panel in April - Reviewed long term complications of uncontrolled cholesterol - Recommend to continue current regimen at this time   Follow Up Plan: phone call in 4 weeks  Catie TJodi Mourning, PharmD, [place] [phone]

## 2022-08-05 NOTE — Patient Instructions (Signed)
Albert Garcia,   It was great talking to you today.   I recommend we re-start metformin with metformin XR 500 mg, one twice daily. Extended release medications can sometimes leave a "ghost capsule" or "ghost tablet" in the stool - it's just the outside shell of the medication, the active ingredient on the inside is released in the stomach and absorbed. The shell is not.   Metformin, even extended release, can cause some diarrhea when first starting, but this should improve within a week.   For now, continue Lantus 54 units daily and Novolog 28 units with meals.   Check your blood sugars twice daily:  1) Fasting, first thing in the morning before breakfast and  2) 2 hours after your largest meal.   For a goal A1c of less than 7%, goal fasting readings are less than 130 and goal 2 hour after meal readings are less than 180.   See about getting an automatic, upper arm blood pressure machine. The Omron brand is one of the more accurate brands.  Check your blood pressure once daily, and any time you have concerning symptoms like headache, chest pain, dizziness, shortness of breath, or vision changes.   Our goal is less than 130/80.  To appropriately check your blood pressure, make sure you do the following:  1) Avoid caffeine, exercise, or tobacco products for 30 minutes before checking. Empty your bladder. 2) Sit with your back supported in a flat-backed chair. Rest your arm on something flat (arm of the chair, table, etc). 3) Sit still with your feet flat on the floor, resting, for at least 5 minutes.  4) Check your blood pressure. Take 1-2 readings.  5) Write down these readings and bring with you to any provider appointments.  Bring your home blood pressure machine with you to a provider's office for accuracy comparison at least once a year.   Make sure you take your blood pressure medications before you come to any office visit, even if you were asked to fast for labs.  Take care!  Catie  Hedwig Morton, PharmD, [facility] [phone]

## 2022-08-06 ENCOUNTER — Other Ambulatory Visit: Payer: Self-pay

## 2022-08-06 DIAGNOSIS — H25812 Combined forms of age-related cataract, left eye: Secondary | ICD-10-CM | POA: Diagnosis not present

## 2022-08-07 ENCOUNTER — Other Ambulatory Visit: Payer: Self-pay | Admitting: Pharmacist

## 2022-08-07 ENCOUNTER — Other Ambulatory Visit: Payer: Self-pay

## 2022-08-07 ENCOUNTER — Encounter: Payer: Self-pay | Admitting: Pharmacist

## 2022-08-07 MED ORDER — METFORMIN HCL ER 500 MG PO TB24: 500.0000 mg | ORAL_TABLET | Freq: Two times a day (BID) | ORAL | 1 refills | Status: DC

## 2022-08-11 ENCOUNTER — Other Ambulatory Visit: Payer: Self-pay | Admitting: Nurse Practitioner

## 2022-08-11 NOTE — Progress Notes (Signed)
Care Coordination Call  Order for metformin sent per PCP.   Catie Hedwig Morton, PharmD, [facility] [phone]

## 2022-08-15 DIAGNOSIS — E113522 Type 2 diabetes mellitus with proliferative diabetic retinopathy with traction retinal detachment involving the macula, left eye: Secondary | ICD-10-CM | POA: Diagnosis not present

## 2022-08-18 ENCOUNTER — Other Ambulatory Visit: Payer: Self-pay | Admitting: Nurse Practitioner

## 2022-08-18 ENCOUNTER — Other Ambulatory Visit: Payer: Self-pay

## 2022-08-18 DIAGNOSIS — E1142 Type 2 diabetes mellitus with diabetic polyneuropathy: Secondary | ICD-10-CM

## 2022-08-18 DIAGNOSIS — I10 Essential (primary) hypertension: Secondary | ICD-10-CM

## 2022-08-19 ENCOUNTER — Other Ambulatory Visit: Payer: Self-pay

## 2022-08-29 ENCOUNTER — Telehealth: Payer: Self-pay

## 2022-08-29 NOTE — Progress Notes (Signed)
   Care Guide Note  08/29/2022 Name: Albert Garcia MRN: [id number] DOB: 05-15-1971  Referred by: Fenton Foy, NP Reason for referral : Care Coordination (Outreach to reschedule f/u with Pharm D due to meeting )   Albert Garcia is a 51 y.o. year old male who is a primary care patient of Fenton Foy, NP. Albert Garcia was referred to the pharmacist for assistance related to DM.    An unsuccessful telephone outreach was attempted today to contact the patient who was referred to the pharmacy team for assistance with medication management. Additional attempts will be made to contact the patient.   Noreene Larsson, [place], [place] [address] Direct Dial: [phone] [email or web address]

## 2022-09-03 ENCOUNTER — Other Ambulatory Visit: Payer: BC Managed Care – PPO | Admitting: Pharmacist

## 2022-09-19 ENCOUNTER — Other Ambulatory Visit: Payer: Self-pay

## 2022-09-25 ENCOUNTER — Other Ambulatory Visit: Payer: Self-pay | Admitting: Nurse Practitioner

## 2022-09-25 ENCOUNTER — Other Ambulatory Visit: Payer: Self-pay

## 2022-09-25 DIAGNOSIS — I10 Essential (primary) hypertension: Secondary | ICD-10-CM

## 2022-09-26 ENCOUNTER — Other Ambulatory Visit: Payer: Self-pay

## 2022-09-29 ENCOUNTER — Other Ambulatory Visit: Payer: Self-pay

## 2022-09-29 DIAGNOSIS — G40909 Epilepsy, unspecified, not intractable, without status epilepticus: Secondary | ICD-10-CM

## 2022-09-29 DIAGNOSIS — E1142 Type 2 diabetes mellitus with diabetic polyneuropathy: Secondary | ICD-10-CM

## 2022-09-29 DIAGNOSIS — I10 Essential (primary) hypertension: Secondary | ICD-10-CM

## 2022-09-29 MED ORDER — BASAGLAR KWIKPEN 100 UNIT/ML ~~LOC~~ SOPN: 54.0000 [IU] | PEN_INJECTOR | Freq: Every day | SUBCUTANEOUS | 0 refills | Status: DC

## 2022-09-29 MED ORDER — LEVETIRACETAM ER 500 MG PO TB24
500.0000 mg | ORAL_TABLET | Freq: Every day | ORAL | 4 refills | Status: DC
  Filled 2022-09-29: qty 90, 90d supply, fill #0

## 2022-09-29 MED ORDER — GABAPENTIN 800 MG PO TABS
800.0000 mg | ORAL_TABLET | Freq: Every day | ORAL | 0 refills | Status: DC
  Filled 2022-09-29: qty 30, 30d supply, fill #0

## 2022-09-29 MED ORDER — METFORMIN HCL ER 500 MG PO TB24: 500.0000 mg | ORAL_TABLET | Freq: Two times a day (BID) | ORAL | 1 refills | Status: DC

## 2022-09-29 MED ORDER — NOVOLOG FLEXPEN 100 UNIT/ML ~~LOC~~ SOPN: 28.0000 [IU] | PEN_INJECTOR | Freq: Three times a day (TID) | SUBCUTANEOUS | 0 refills | Status: DC

## 2022-09-29 MED ORDER — ATORVASTATIN CALCIUM 20 MG PO TABS: 20.0000 mg | ORAL_TABLET | Freq: Every day | ORAL | 1 refills | Status: DC

## 2022-09-29 MED ORDER — AMLODIPINE BESYLATE 10 MG PO TABS: 10.0000 mg | ORAL_TABLET | Freq: Every day | ORAL | 1 refills | Status: DC

## 2022-09-29 MED ORDER — LISINOPRIL 10 MG PO TABS: 10.0000 mg | ORAL_TABLET | Freq: Every day | ORAL | 0 refills | Status: DC

## 2022-09-29 MED ORDER — TRUEPLUS LANCETS 28G MISC: 11 refills | Status: AC

## 2022-09-29 NOTE — Telephone Encounter (Signed)
Pt is requesting to refill his Keppra and gabapentin to a new pharmacy listed already on the medication. Please advise if you will. KH

## 2022-10-08 DIAGNOSIS — E113522 Type 2 diabetes mellitus with proliferative diabetic retinopathy with traction retinal detachment involving the macula, left eye: Secondary | ICD-10-CM | POA: Diagnosis not present

## 2022-10-10 ENCOUNTER — Other Ambulatory Visit: Payer: Self-pay

## 2022-10-10 NOTE — Progress Notes (Signed)
Error

## 2022-10-10 NOTE — Telephone Encounter (Signed)
Please advise KH 

## 2022-10-13 MED ORDER — GABAPENTIN 800 MG PO TABS: 800.0000 mg | ORAL_TABLET | Freq: Every day | ORAL | 0 refills | Status: DC

## 2022-10-14 DIAGNOSIS — H3342 Traction detachment of retina, left eye: Secondary | ICD-10-CM | POA: Diagnosis not present

## 2022-10-17 ENCOUNTER — Encounter: Payer: Self-pay | Admitting: Nurse Practitioner

## 2022-10-17 ENCOUNTER — Telehealth (INDEPENDENT_AMBULATORY_CARE_PROVIDER_SITE_OTHER): Payer: BC Managed Care – PPO | Admitting: Nurse Practitioner

## 2022-10-17 VITALS — Ht 68.0 in | Wt 261.0 lb

## 2022-10-17 DIAGNOSIS — Z794 Long term (current) use of insulin: Secondary | ICD-10-CM

## 2022-10-17 DIAGNOSIS — E1142 Type 2 diabetes mellitus with diabetic polyneuropathy: Secondary | ICD-10-CM | POA: Diagnosis not present

## 2022-10-17 NOTE — Patient Instructions (Addendum)
1. Type 2 diabetes mellitus with diabetic polyneuropathy, with long-term current use of insulin (HCC)  - AMB Referral to Pharmacy Medication Management - CBC; Future - Comprehensive metabolic panel; Future - Hemoglobin A1c; Future  Follow up:  3 months

## 2022-10-17 NOTE — Progress Notes (Signed)
Virtual Visit via Video Note  I connected with Albert Garcia on 10/17/22 at  8:40 AM EST by a video enabled telemedicine application and verified that I am speaking with the correct person using two identifiers.  Location: Patient: home Provider: office   I discussed the limitations of evaluation and management by telemedicine and the availability of in person appointments. The patient expressed understanding and agreed to proceed.  History of Present Illness:  51 year old male with history of diabetes and hypertension.   Patient presents today for diabetic follow-up.  He recently had eye surgery. Metformin upsetting stomach.  We will place a referral for pharmacy for diabetic medication management.  Patient will need to return for labs. Denies f/c/s, n/v/d, hemoptysis, PND, leg swelling Denies chest pain or edema    Observations/Objective:     10/17/2022    8:26 AM 07/21/2022    8:34 AM 04/21/2022    9:22 AM  Vitals with BMI  Height 5\' 8"  5\' 8"    Weight 261 lbs 261 lbs   BMI 39.69 39.69   Systolic  140 137  Diastolic  91 86  Pulse  87 82     Assessment and Plan:  1. Type 2 diabetes mellitus with diabetic polyneuropathy, with long-term current use of insulin (HCC)  - AMB Referral to Pharmacy Medication Management - CBC; Future - Comprehensive metabolic panel; Future - Hemoglobin A1c; Future  Follow up:  3 months    I discussed the assessment and treatment plan with the patient. The patient was provided an opportunity to ask questions and all were answered. The patient agreed with the plan and demonstrated an understanding of the instructions.   The patient was advised to call back or seek an in-person evaluation if the symptoms worsen or if the condition fails to improve as anticipated.  I provided 23 minutes of non-face-to-face time during this encounter.   , NP

## 2022-10-22 DIAGNOSIS — Z09 Encounter for follow-up examination after completed treatment for conditions other than malignant neoplasm: Secondary | ICD-10-CM | POA: Diagnosis not present

## 2022-10-22 DIAGNOSIS — E113522 Type 2 diabetes mellitus with proliferative diabetic retinopathy with traction retinal detachment involving the macula, left eye: Secondary | ICD-10-CM | POA: Diagnosis not present

## 2022-10-30 ENCOUNTER — Other Ambulatory Visit: Payer: BC Managed Care – PPO | Admitting: Pharmacist

## 2022-10-31 ENCOUNTER — Telehealth: Payer: Self-pay

## 2022-10-31 NOTE — Progress Notes (Signed)
   Care Guide Note  10/31/2022 Name: Albert Garcia MRN: [id number] DOB: 1970/12/31  Referred by: Fenton Foy, NP Reason for referral : Care Coordination (Outreach to re schedule f/u with Pharm D due to being out )   Albert Garcia is a 52 y.o. year old male who is a primary care patient of Fenton Foy, NP. Albert Garcia was referred to the pharmacist for assistance related to DM.    An unsuccessful telephone outreach was attempted today to contact the patient who was referred to the pharmacy team for assistance with medication management. Additional attempts will be made to contact the patient.   Albert Garcia, [place], [place] [address] Direct Dial: [phone] [email or web address]

## 2022-11-03 NOTE — Progress Notes (Signed)
   Care Guide Note  11/03/2022 Name: Albert Garcia MRN: [id number] DOB: 06-Nov-1970  Referred by: Fenton Foy, NP Reason for referral : Care Coordination (Outreach to re schedule f/u with Pharm D due to being out )   Albert Garcia is a 52 y.o. year old male who is a primary care patient of Fenton Foy, NP. Albert Garcia was referred to the pharmacist for assistance related to DM.    A second unsuccessful telephone outreach was attempted today to contact the patient who was referred to the pharmacy team for assistance with medication management. Additional attempts will be made to contact the patient.  Noreene Larsson, [place], [place] [address] Direct Dial: [phone] [email or web address]

## 2022-11-05 DIAGNOSIS — E113591 Type 2 diabetes mellitus with proliferative diabetic retinopathy without macular edema, right eye: Secondary | ICD-10-CM | POA: Diagnosis not present

## 2022-11-14 NOTE — Progress Notes (Signed)
  Care Coordination Note  11/14/2022 Name: DEMETRION WESBY MRN: [id number] DOB: 11/03/1970  Albert Garcia is a 52 y.o. year old male who is a primary care patient of Fenton Foy, NP and is actively engaged with the care management team. I reached out to [name] by phone today to assist with re-scheduling a follow up visit with the Pharmacist  Follow up plan: Unable to make contact on outreach attempts x 3. PCP Fenton Foy, NP notified via routed documentation in medical record.   Noreene Larsson, [place], [place] [address] Direct Dial: [phone] [email or web address]

## 2022-12-03 DIAGNOSIS — E113591 Type 2 diabetes mellitus with proliferative diabetic retinopathy without macular edema, right eye: Secondary | ICD-10-CM | POA: Diagnosis not present

## 2022-12-10 ENCOUNTER — Other Ambulatory Visit: Payer: Self-pay | Admitting: Nurse Practitioner

## 2022-12-10 DIAGNOSIS — Z794 Long term (current) use of insulin: Secondary | ICD-10-CM

## 2022-12-10 DIAGNOSIS — E1142 Type 2 diabetes mellitus with diabetic polyneuropathy: Secondary | ICD-10-CM

## 2022-12-17 DIAGNOSIS — H25811 Combined forms of age-related cataract, right eye: Secondary | ICD-10-CM | POA: Diagnosis not present

## 2022-12-19 ENCOUNTER — Telehealth: Payer: Self-pay | Admitting: Nurse Practitioner

## 2022-12-19 ENCOUNTER — Other Ambulatory Visit: Payer: Self-pay | Admitting: Nurse Practitioner

## 2022-12-19 MED ORDER — GABAPENTIN 800 MG PO TABS: 800.0000 mg | ORAL_TABLET | Freq: Every day | ORAL | 0 refills | Status: DC

## 2022-12-19 NOTE — Telephone Encounter (Signed)
Caller & Relationship to patient: pharmacy   MRN #  [id number]   Call Back Number: [phone]  Date of Last Office Visit: 12/10/2022     Date of Next Office Visit: Visit date not found    Medication(s) to be Refilled: Gabapentin 800 mg   Preferred Pharmacy: [facility]  ** Please notify patient to allow 48-72 hours to process** **Let patient know to contact pharmacy at the end of the day to make sure medication is ready. ** **If patient has not been seen in a year or longer, book an appointment **Advise to use MyChart for refill requests OR to contact their pharmacy

## 2023-01-06 ENCOUNTER — Other Ambulatory Visit: Payer: Self-pay | Admitting: Nurse Practitioner

## 2023-01-06 DIAGNOSIS — Z794 Long term (current) use of insulin: Secondary | ICD-10-CM

## 2023-01-07 DIAGNOSIS — H4312 Vitreous hemorrhage, left eye: Secondary | ICD-10-CM | POA: Diagnosis not present

## 2023-01-07 DIAGNOSIS — E113593 Type 2 diabetes mellitus with proliferative diabetic retinopathy without macular edema, bilateral: Secondary | ICD-10-CM | POA: Diagnosis not present

## 2023-01-07 DIAGNOSIS — E113522 Type 2 diabetes mellitus with proliferative diabetic retinopathy with traction retinal detachment involving the macula, left eye: Secondary | ICD-10-CM | POA: Diagnosis not present

## 2023-01-07 DIAGNOSIS — H3322 Serous retinal detachment, left eye: Secondary | ICD-10-CM | POA: Diagnosis not present

## 2023-01-07 DIAGNOSIS — H3343 Traction detachment of retina, bilateral: Secondary | ICD-10-CM | POA: Diagnosis not present

## 2023-01-07 DIAGNOSIS — E113591 Type 2 diabetes mellitus with proliferative diabetic retinopathy without macular edema, right eye: Secondary | ICD-10-CM | POA: Diagnosis not present

## 2023-01-17 ENCOUNTER — Other Ambulatory Visit: Payer: Self-pay | Admitting: Nurse Practitioner

## 2023-01-20 DIAGNOSIS — H3322 Serous retinal detachment, left eye: Secondary | ICD-10-CM | POA: Diagnosis not present

## 2023-01-23 ENCOUNTER — Other Ambulatory Visit: Payer: Self-pay

## 2023-01-23 DIAGNOSIS — I10 Essential (primary) hypertension: Secondary | ICD-10-CM

## 2023-01-23 DIAGNOSIS — E1142 Type 2 diabetes mellitus with diabetic polyneuropathy: Secondary | ICD-10-CM

## 2023-01-23 MED ORDER — METFORMIN HCL ER 500 MG PO TB24: 500.0000 mg | ORAL_TABLET | Freq: Two times a day (BID) | ORAL | 1 refills | Status: DC

## 2023-01-23 MED ORDER — LISINOPRIL 10 MG PO TABS: 10.0000 mg | ORAL_TABLET | Freq: Every day | ORAL | 0 refills | Status: DC

## 2023-01-23 MED ORDER — ATORVASTATIN CALCIUM 20 MG PO TABS: 20.0000 mg | ORAL_TABLET | Freq: Every day | ORAL | 1 refills | Status: DC

## 2023-01-23 MED ORDER — NOVOLOG FLEXPEN 100 UNIT/ML ~~LOC~~ SOPN: PEN_INJECTOR | SUBCUTANEOUS | 0 refills | Status: DC

## 2023-01-23 MED ORDER — BASAGLAR KWIKPEN 100 UNIT/ML ~~LOC~~ SOPN: 54.0000 [IU] | PEN_INJECTOR | Freq: Every day | SUBCUTANEOUS | 0 refills | Status: DC

## 2023-01-23 MED ORDER — AMLODIPINE BESYLATE 10 MG PO TABS: 10.0000 mg | ORAL_TABLET | Freq: Every day | ORAL | 1 refills | Status: DC

## 2023-01-23 NOTE — Telephone Encounter (Signed)
Please advise KH 

## 2023-01-26 MED ORDER — GABAPENTIN 800 MG PO TABS: 800.0000 mg | ORAL_TABLET | Freq: Every day | ORAL | 0 refills | Status: DC

## 2023-04-01 ENCOUNTER — Encounter: Payer: Self-pay | Admitting: Nurse Practitioner

## 2023-04-01 ENCOUNTER — Other Ambulatory Visit: Payer: Self-pay

## 2023-04-01 ENCOUNTER — Ambulatory Visit (INDEPENDENT_AMBULATORY_CARE_PROVIDER_SITE_OTHER): Payer: BC Managed Care – PPO | Admitting: Nurse Practitioner

## 2023-04-01 VITALS — BP 118/68 | HR 91 | Temp 97.0°F | Wt 263.2 lb

## 2023-04-01 DIAGNOSIS — I10 Essential (primary) hypertension: Secondary | ICD-10-CM

## 2023-04-01 DIAGNOSIS — E1142 Type 2 diabetes mellitus with diabetic polyneuropathy: Secondary | ICD-10-CM

## 2023-04-01 DIAGNOSIS — Z794 Long term (current) use of insulin: Secondary | ICD-10-CM | POA: Diagnosis not present

## 2023-04-01 DIAGNOSIS — Z1211 Encounter for screening for malignant neoplasm of colon: Secondary | ICD-10-CM

## 2023-04-01 DIAGNOSIS — G40909 Epilepsy, unspecified, not intractable, without status epilepticus: Secondary | ICD-10-CM

## 2023-04-01 DIAGNOSIS — Z13220 Encounter for screening for lipoid disorders: Secondary | ICD-10-CM

## 2023-04-01 DIAGNOSIS — R5383 Other fatigue: Secondary | ICD-10-CM

## 2023-04-01 LAB — POCT GLYCOSYLATED HEMOGLOBIN (HGB A1C): Hemoglobin A1C: 8.7 % — AB (ref 4.0–5.6)

## 2023-04-01 MED ORDER — GABAPENTIN 800 MG PO TABS: 800.0000 mg | ORAL_TABLET | Freq: Every day | ORAL | 0 refills | Status: DC

## 2023-04-01 MED ORDER — AMLODIPINE BESYLATE 10 MG PO TABS: 10.0000 mg | ORAL_TABLET | Freq: Every day | ORAL | 1 refills | Status: DC

## 2023-04-01 MED ORDER — LISINOPRIL 10 MG PO TABS: 10.0000 mg | ORAL_TABLET | Freq: Every day | ORAL | 0 refills | Status: DC

## 2023-04-01 MED ORDER — BASAGLAR KWIKPEN 100 UNIT/ML ~~LOC~~ SOPN: 54.0000 [IU] | PEN_INJECTOR | Freq: Every day | SUBCUTANEOUS | 0 refills | Status: DC

## 2023-04-01 MED ORDER — CHOLECALCIFEROL 10 MCG (400 UNIT) PO TABS: 400.0000 [IU] | ORAL_TABLET | Freq: Every day | ORAL | 0 refills | Status: AC

## 2023-04-01 MED ORDER — METFORMIN HCL ER 500 MG PO TB24: 500.0000 mg | ORAL_TABLET | Freq: Two times a day (BID) | ORAL | 1 refills | Status: DC

## 2023-04-01 MED ORDER — LEVETIRACETAM ER 500 MG PO TB24: 500.0000 mg | ORAL_TABLET | Freq: Every day | ORAL | 4 refills | Status: DC

## 2023-04-01 MED ORDER — NOVOLOG FLEXPEN 100 UNIT/ML ~~LOC~~ SOPN: PEN_INJECTOR | SUBCUTANEOUS | 0 refills | Status: DC

## 2023-04-01 MED ORDER — ATORVASTATIN CALCIUM 20 MG PO TABS: 20.0000 mg | ORAL_TABLET | Freq: Every day | ORAL | 1 refills | Status: DC

## 2023-04-01 NOTE — Progress Notes (Signed)
@Patient  ID: Albert Garcia, male    DOB: 1971/08/26, 52 y.o.   MRN: [id number]  Chief Complaint  Patient presents with   Diabetes    Referring provider: Ivonne Andrew, NP   HPI  52 year old male with history of diabetes and hypertension.   Patient presents today for diabetic follow-up.  He recently had eye surgery. Has ran out of medications. We will place a referral for pharmacy for diabetic medication management.  A1c in office today is 8.7.  Patient does need refills on meds today.  Denies f/c/s, n/v/d, hemoptysis, PND, leg swelling Denies chest pain or edema      Allergies  Allergen Reactions   Hctz [Hydrochlorothiazide]     Causes ED     Immunization History  Administered Date(s) Administered   Hepatitis B 06/15/2022   Influenza Inj Mdck Quad Pf 06/30/2018   Influenza Whole 08/19/2006   Influenza,inj,Quad PF,6+ Mos 11/28/2016, 07/13/2019   Influenza-Unspecified 07/23/2017, 09/19/2022   PFIZER(Purple Top)SARS-COV-2 Vaccination 01/13/2020, 02/07/2020   Pneumococcal Polysaccharide-23 11/27/2004, 11/28/2016   Tdap 01/16/2013   Zoster Recombinat (Shingrix) 05/13/2022    Past Medical History:  Diagnosis Date   Diabetes mellitus    Erectile dysfunction    Headache    Hypertension    Seizures (HCC)     Tobacco History: Social History   Tobacco Use  Smoking Status Never  Smokeless Tobacco Never   Counseling given: Not Answered   Outpatient Encounter Medications as of 04/01/2023  Medication Sig   aspirin EC 81 MG tablet Take 1 tablet (81 mg total) by mouth daily.   Insulin Pen Needle (B-D UF III MINI PEN NEEDLES) 31G X 5 MM MISC USE AS DIRECTED   Multiple Vitamin (MULTIVITAMIN WITH MINERALS) TABS tablet Take 1 tablet by mouth daily. Reported on 11/13/2015   NOVOLOG FLEXPEN 100 UNIT/ML FlexPen INJECT 28 UNITS SUBCUTANEOUSLY THREE TIMES DAILY WITH MEALS   SUPER B COMPLEX/C PO Take 1 capsule by mouth daily.   [DISCONTINUED] amLODipine (NORVASC) 10 MG  tablet Take 1 tablet (10 mg total) by mouth daily.   [DISCONTINUED] atorvastatin (LIPITOR) 20 MG tablet Take 1 tablet (20 mg total) by mouth daily.   [DISCONTINUED] gabapentin (NEURONTIN) 800 MG tablet Take 1 tablet (800 mg total) by mouth at bedtime.   [DISCONTINUED] Insulin Glargine (BASAGLAR KWIKPEN) 100 UNIT/ML Inject 54 Units into the skin at bedtime.   [DISCONTINUED] levETIRAcetam (KEPPRA XR) 500 MG 24 hr tablet Take 1 tablet (500 mg total) by mouth daily.   [DISCONTINUED] lisinopril (ZESTRIL) 10 MG tablet Take 1 tablet (10 mg total) by mouth daily.   [DISCONTINUED] metFORMIN (GLUCOPHAGE-XR) 500 MG 24 hr tablet Take 1 tablet (500 mg total) by mouth 2 (two) times daily with a meal.   amLODipine (NORVASC) 10 MG tablet Take 1 tablet (10 mg total) by mouth daily.   atorvastatin (LIPITOR) 20 MG tablet Take 1 tablet (20 mg total) by mouth daily.   blood glucose meter kit and supplies KIT Dispense based on patient and insurance preference. Use up to four times daily as directed. (Patient not taking: Reported on 04/01/2023)   cholecalciferol (VITAMIN D-400) 10 MCG (400 UNIT) TABS tablet Take 1 tablet (400 Units total) by mouth daily.   Continuous Blood Gluc Receiver (FREESTYLE LIBRE 14 DAY READER) DEVI 1 Device by Does not apply route continuous. (Patient not taking: Reported on 10/17/2022)   gabapentin (NEURONTIN) 800 MG tablet Take 1 tablet (800 mg total) by mouth at bedtime.   GARLIC PO Take  1 capsule by mouth daily. (Patient not taking: Reported on 08/05/2022)   glucose blood (TRUE METRIX BLOOD GLUCOSE TEST) test strip Use as directed up to 4 (four) times daily. (Patient not taking: Reported on 04/01/2023)   glucose blood test strip Use as instructed 3 times daily (Patient not taking: Reported on 04/01/2023)   Insulin Glargine (BASAGLAR KWIKPEN) 100 UNIT/ML Inject 54 Units into the skin at bedtime.   levETIRAcetam (KEPPRA XR) 500 MG 24 hr tablet Take 1 tablet (500 mg total) by mouth daily.   lisinopril  (ZESTRIL) 10 MG tablet Take 1 tablet (10 mg total) by mouth daily.   metFORMIN (GLUCOPHAGE-XR) 500 MG 24 hr tablet Take 1 tablet (500 mg total) by mouth 2 (two) times daily with a meal.   ONETOUCH DELICA LANCETS 33G MISC 1 each by Does not apply route 3 (three) times daily. (Patient not taking: Reported on 04/01/2023)   TRUEplus Lancets 28G MISC use as directed up to 4 times daily (Patient not taking: Reported on 04/01/2023)   [DISCONTINUED] cholecalciferol (VITAMIN D-400) 10 MCG (400 UNIT) TABS tablet Take 1 tablet (400 Units total) by mouth daily. (Patient not taking: Reported on 04/01/2023)   [DISCONTINUED] omeprazole (PRILOSEC) 20 MG capsule TAKE 1 CAPSULE (20 MG TOTAL) BY MOUTH DAILY. (Patient not taking: Reported on 05/11/2020)   [DISCONTINUED] promethazine (PHENERGAN) 12.5 MG tablet Take 1 tablet (12.5 mg total) by mouth daily as needed for nausea or vomiting. (Patient not taking: Reported on 05/11/2020)   [DISCONTINUED] sildenafil (VIAGRA) 50 MG tablet Take 0.5-1 tablets (25-50 mg total) by mouth daily as needed for erectile dysfunction. (Patient not taking: Reported on 05/11/2020)   [DISCONTINUED] sitaGLIPtin-metformin (JANUMET) 50-1000 MG tablet Take 1 tablet by mouth 2 (two) times daily with a meal. (Patient not taking: Reported on 05/11/2020)   No facility-administered encounter medications on file as of 04/01/2023.     Review of Systems  Review of Systems  Constitutional: Negative.   HENT: Negative.    Cardiovascular: Negative.   Gastrointestinal: Negative.   Allergic/Immunologic: Negative.   Neurological: Negative.   Psychiatric/Behavioral: Negative.         Physical Exam  BP 118/68   Pulse 91   Temp (!) 97 F (36.1 C)   Wt 263 lb 3.2 oz (119.4 kg)   SpO2 99%   BMI 40.02 kg/m   Wt Readings from Last 5 Encounters:  04/01/23 263 lb 3.2 oz (119.4 kg)  10/17/22 261 lb (118.4 kg)  07/21/22 261 lb (118.4 kg)  04/21/22 267 lb 9.6 oz (121.4 kg)  01/27/22 252 lb 3.2 oz (114.4 kg)      Physical Exam Vitals and nursing note reviewed.  Constitutional:      General: He is not in acute distress.    Appearance: He is well-developed.  Cardiovascular:     Rate and Rhythm: Normal rate and regular rhythm.  Pulmonary:     Effort: Pulmonary effort is normal.     Breath sounds: Normal breath sounds.  Skin:    General: Skin is warm and dry.  Neurological:     Mental Status: He is alert and oriented to person, place, and time.      Lab Results:  CBC    Component Value Date/Time   WBC 4.0 04/21/2022 0926   WBC 3.8 (L) 05/11/2020 0909   RBC 4.33 04/21/2022 0926   RBC 3.95 (L) 05/11/2020 0909   HGB 14.1 04/21/2022 0926   HCT 40.0 04/21/2022 0926   PLT 250 04/21/2022 1610  MCV 92 04/21/2022 0926   MCH 32.6 04/21/2022 0926   MCH 32.7 05/11/2020 0909   MCHC 35.3 04/21/2022 0926   MCHC 35.3 05/11/2020 0909   RDW 12.2 04/21/2022 0926   LYMPHSABS 2.5 12/06/2017 2310   MONOABS 0.4 12/06/2017 2310   EOSABS 0.1 12/06/2017 2310   BASOSABS 0.0 12/06/2017 2310    BMET    Component Value Date/Time   NA 143 04/21/2022 0926   K 4.2 04/21/2022 0926   CL 102 04/21/2022 0926   CO2 25 04/21/2022 0926   GLUCOSE 112 (H) 04/21/2022 0926   GLUCOSE 233 (H) 05/11/2020 0909   BUN 13 04/21/2022 0926   CREATININE 0.80 04/21/2022 0926   CREATININE 0.81 11/28/2016 1232   CALCIUM 10.2 04/21/2022 0926   GFRNONAA >60 05/11/2020 0909   GFRNONAA >89 11/28/2016 1232   GFRAA >60 05/11/2020 0909   GFRAA >89 11/28/2016 1232    BNP    Component Value Date/Time   BNP 11.0 02/18/2018 1030      Assessment & Plan:   Type 2 diabetes mellitus with diabetic polyneuropathy, with long-term current use of insulin (HCC) - Microalbumin/Creatinine Ratio, Urine - POCT glycosylated hemoglobin (Hb A1C) - Ambulatory referral to Podiatry - metFORMIN (GLUCOPHAGE-XR) 500 MG 24 hr tablet; Take 1 tablet (500 mg total) by mouth 2 (two) times daily with a meal.  Dispense: 180 tablet; Refill:  1 - Insulin Glargine (BASAGLAR KWIKPEN) 100 UNIT/ML; Inject 54 Units into the skin at bedtime.  Dispense: 45 mL; Refill: 0 - AMB Referral to Pharmacy Medication Management - CBC - Comprehensive metabolic panel  2. Essential hypertension  - lisinopril (ZESTRIL) 10 MG tablet; Take 1 tablet (10 mg total) by mouth daily.  Dispense: 90 tablet; Refill: 0 - amLODipine (NORVASC) 10 MG tablet; Take 1 tablet (10 mg total) by mouth daily.  Dispense: 90 tablet; Refill: 1  3. Seizure disorder (HCC)  - levETIRAcetam (KEPPRA XR) 500 MG 24 hr tablet; Take 1 tablet (500 mg total) by mouth daily.  Dispense: 90 tablet; Refill: 4  4. Lipid screening  - atorvastatin (LIPITOR) 20 MG tablet; Take 1 tablet (20 mg total) by mouth daily.  Dispense: 90 tablet; Refill: 1 - Lipid Panel  5. Other fatigue  - Testosterone; Future - will need 2 checks for 8:00 am lab visits  Follow up:  Follow up in 3 months     Ivonne Andrew, NP 04/01/2023

## 2023-04-01 NOTE — Assessment & Plan Note (Signed)
-   Microalbumin/Creatinine Ratio, Urine - POCT glycosylated hemoglobin (Hb A1C) - Ambulatory referral to Podiatry - metFORMIN (GLUCOPHAGE-XR) 500 MG 24 hr tablet; Take 1 tablet (500 mg total) by mouth 2 (two) times daily with a meal.  Dispense: 180 tablet; Refill: 1 - Insulin Glargine (BASAGLAR KWIKPEN) 100 UNIT/ML; Inject 54 Units into the skin at bedtime.  Dispense: 45 mL; Refill: 0 - AMB Referral to Pharmacy Medication Management - CBC - Comprehensive metabolic panel  2. Essential hypertension  - lisinopril (ZESTRIL) 10 MG tablet; Take 1 tablet (10 mg total) by mouth daily.  Dispense: 90 tablet; Refill: 0 - amLODipine (NORVASC) 10 MG tablet; Take 1 tablet (10 mg total) by mouth daily.  Dispense: 90 tablet; Refill: 1  3. Seizure disorder (HCC)  - levETIRAcetam (KEPPRA XR) 500 MG 24 hr tablet; Take 1 tablet (500 mg total) by mouth daily.  Dispense: 90 tablet; Refill: 4  4. Lipid screening  - atorvastatin (LIPITOR) 20 MG tablet; Take 1 tablet (20 mg total) by mouth daily.  Dispense: 90 tablet; Refill: 1 - Lipid Panel  5. Other fatigue  - Testosterone; Future - will need 2 checks for 8:00 am lab visits  Follow up:  Follow up in 3 months

## 2023-04-01 NOTE — Patient Instructions (Signed)
1. Type 2 diabetes mellitus with diabetic polyneuropathy, with long-term current use of insulin (HCC)  - Microalbumin/Creatinine Ratio, Urine - POCT glycosylated hemoglobin (Hb A1C) - Ambulatory referral to Podiatry - metFORMIN (GLUCOPHAGE-XR) 500 MG 24 hr tablet; Take 1 tablet (500 mg total) by mouth 2 (two) times daily with a meal.  Dispense: 180 tablet; Refill: 1 - Insulin Glargine (BASAGLAR KWIKPEN) 100 UNIT/ML; Inject 54 Units into the skin at bedtime.  Dispense: 45 mL; Refill: 0 - AMB Referral to Pharmacy Medication Management - CBC - Comprehensive metabolic panel  2. Essential hypertension  - lisinopril (ZESTRIL) 10 MG tablet; Take 1 tablet (10 mg total) by mouth daily.  Dispense: 90 tablet; Refill: 0 - amLODipine (NORVASC) 10 MG tablet; Take 1 tablet (10 mg total) by mouth daily.  Dispense: 90 tablet; Refill: 1  3. Seizure disorder (HCC)  - levETIRAcetam (KEPPRA XR) 500 MG 24 hr tablet; Take 1 tablet (500 mg total) by mouth daily.  Dispense: 90 tablet; Refill: 4  4. Lipid screening  - atorvastatin (LIPITOR) 20 MG tablet; Take 1 tablet (20 mg total) by mouth daily.  Dispense: 90 tablet; Refill: 1 - Lipid Panel  5. Other fatigue  - Testosterone; Future - will need 2 checks for 8:00 am lab visits  Follow up:  Follow up in 3 months

## 2023-04-02 LAB — COMPREHENSIVE METABOLIC PANEL
ALT: 19 IU/L (ref 0–44)
AST: 16 IU/L (ref 0–40)
Albumin/Globulin Ratio: 1.4 (ref 1.2–2.2)
Albumin: 4.2 g/dL (ref 3.8–4.9)
Alkaline Phosphatase: 86 IU/L (ref 44–121)
BUN/Creatinine Ratio: 18 (ref 9–20)
BUN: 19 mg/dL (ref 6–24)
Bilirubin Total: 0.6 mg/dL (ref 0.0–1.2)
CO2: 27 mmol/L (ref 20–29)
Calcium: 10 mg/dL (ref 8.7–10.2)
Chloride: 101 mmol/L (ref 96–106)
Creatinine, Ser: 1.07 mg/dL (ref 0.76–1.27)
Globulin, Total: 3 g/dL (ref 1.5–4.5)
Glucose: 202 mg/dL — ABNORMAL HIGH (ref 70–99)
Potassium: 4.2 mmol/L (ref 3.5–5.2)
Sodium: 142 mmol/L (ref 134–144)
Total Protein: 7.2 g/dL (ref 6.0–8.5)
eGFR: 84 mL/min/{1.73_m2} (ref >59)

## 2023-04-02 LAB — MICROALBUMIN / CREATININE URINE RATIO
Creatinine, Urine: 358.7 mg/dL
Microalb/Creat Ratio: 303 mg/g creat — ABNORMAL HIGH (ref 0–29)
Microalbumin, Urine: 1088.6 ug/mL

## 2023-04-02 LAB — CBC
Hematocrit: 40.1 % (ref 37.5–51.0)
Hemoglobin: 14 g/dL (ref 13.0–17.7)
MCH: 32.2 pg (ref 26.6–33.0)
MCHC: 34.9 g/dL (ref 31.5–35.7)
MCV: 92 fL (ref 79–97)
Platelets: 329 10*3/uL (ref 150–450)
RBC: 4.35 x10E6/uL (ref 4.14–5.80)
RDW: 11.9 % (ref 11.6–15.4)
WBC: 3.7 10*3/uL (ref 3.4–10.8)

## 2023-04-02 LAB — LIPID PANEL
Chol/HDL Ratio: 3.5 ratio (ref 0.0–5.0)
Cholesterol, Total: 106 mg/dL (ref 100–199)
HDL: 30 mg/dL — ABNORMAL LOW (ref >39)
LDL Chol Calc (NIH): 41 mg/dL (ref 0–99)
Triglycerides: 221 mg/dL — ABNORMAL HIGH (ref 0–149)
VLDL Cholesterol Cal: 35 mg/dL (ref 5–40)

## 2023-04-08 ENCOUNTER — Encounter: Payer: Self-pay | Admitting: Podiatry

## 2023-04-08 ENCOUNTER — Ambulatory Visit (INDEPENDENT_AMBULATORY_CARE_PROVIDER_SITE_OTHER): Payer: BC Managed Care – PPO | Admitting: Podiatry

## 2023-04-08 DIAGNOSIS — E1140 Type 2 diabetes mellitus with diabetic neuropathy, unspecified: Secondary | ICD-10-CM | POA: Insufficient documentation

## 2023-04-08 DIAGNOSIS — B353 Tinea pedis: Secondary | ICD-10-CM | POA: Diagnosis not present

## 2023-04-08 DIAGNOSIS — B351 Tinea unguium: Secondary | ICD-10-CM | POA: Diagnosis not present

## 2023-04-08 DIAGNOSIS — M79675 Pain in left toe(s): Secondary | ICD-10-CM

## 2023-04-08 DIAGNOSIS — E1142 Type 2 diabetes mellitus with diabetic polyneuropathy: Secondary | ICD-10-CM | POA: Diagnosis not present

## 2023-04-08 DIAGNOSIS — M79674 Pain in right toe(s): Secondary | ICD-10-CM | POA: Diagnosis not present

## 2023-04-08 MED ORDER — CLOTRIMAZOLE-BETAMETHASONE 1-0.05 % EX CREA: 1.0000 | TOPICAL_CREAM | Freq: Every day | CUTANEOUS | 0 refills | Status: DC

## 2023-04-08 NOTE — Progress Notes (Signed)
This patient presents to my office for at risk foot care.  This patient requires this care by a professional since this patient will be at risk due to having diabetic neuropathy.This patient is unable to cut nails himself since the patient cannot reach his nails.These nails are painful walking and wearing shoes. He also has peeling om the sides of his heels.  No itching noted. This patient presents for at risk foot care today.  General Appearance  Alert, conversant and in no acute stress.  Vascular  Dorsalis pedis and posterior tibial  pulses are palpable  bilaterally.  Capillary return is within normal limits  bilaterally. Temperature is within normal limits  bilaterally.  Neurologic  Senn-Weinstein monofilament wire test within normal limits/diminished  bilaterally. Muscle power within normal limits bilaterally.  Nails Thick disfigured discolored nails with subungual debris  from hallux to fifth toes bilaterally. No evidence of bacterial infection or drainage bilaterally.  Orthopedic  No limitations of motion  feet .  No crepitus or effusions noted.  No bony pathology or digital deformities noted.  Skin  normotropic skin with no porokeratosis noted bilaterally.  No signs of infections or ulcers noted.   Peeling noted heels  B/L.  No redness noted.   Onychomycosis  Pain in right toes  Pain in left toes  Consent was obtained for treatment procedures.   Mechanical debridement of nails 1-5  bilaterally performed with a nail nipper.  Filed with dremel without incident.  Prescribe lotrisone.   Return office visit   3 months                   Told patient to return for periodic foot care and evaluation due to potential at risk complications.   Helane Gunther DPM

## 2023-04-14 LAB — COLOGUARD

## 2023-04-17 ENCOUNTER — Telehealth: Payer: Self-pay

## 2023-04-17 NOTE — Progress Notes (Signed)
   Care Guide Note  04/17/2023 Name: HAJI DELAINE MRN: [id number] DOB: [birth year]  Referred by: Ivonne Andrew, NP Reason for referral : Care Coordination (Outreach to schedule with Pharm d )   CHRISS MANNAN is a 52 y.o. year old male who is a primary care patient of Ivonne Andrew, NP. [name] was referred to the pharmacist for assistance related to DM.    A third unsuccessful telephone outreach was attempted today to contact the patient who was referred to the pharmacy team for assistance with medication management. The Population Health team is pleased to engage with this patient at any time in the future upon receipt of referral and should he/she be interested in assistance from the [facility] team.   Penne Lash, RMA Care Guide [facility]  [place], [place] [address] Direct Dial: [phone] [email or web address]

## 2023-04-28 DIAGNOSIS — E113553 Type 2 diabetes mellitus with stable proliferative diabetic retinopathy, bilateral: Secondary | ICD-10-CM | POA: Diagnosis not present

## 2023-04-28 DIAGNOSIS — H3322 Serous retinal detachment, left eye: Secondary | ICD-10-CM | POA: Diagnosis not present

## 2023-04-28 DIAGNOSIS — Z961 Presence of intraocular lens: Secondary | ICD-10-CM | POA: Diagnosis not present

## 2023-05-20 ENCOUNTER — Other Ambulatory Visit: Payer: Self-pay | Admitting: Nurse Practitioner

## 2023-05-20 MED ORDER — GUAIFENESIN ER 600 MG PO TB12: 600.0000 mg | ORAL_TABLET | Freq: Two times a day (BID) | ORAL | 2 refills | Status: AC | PRN

## 2023-05-20 MED ORDER — LEVOCETIRIZINE DIHYDROCHLORIDE 5 MG PO TABS: 5.0000 mg | ORAL_TABLET | Freq: Every evening | ORAL | 2 refills | Status: DC

## 2023-05-22 ENCOUNTER — Other Ambulatory Visit: Payer: Self-pay | Admitting: Nurse Practitioner

## 2023-05-22 MED ORDER — TADALAFIL 10 MG PO TABS: 10.0000 mg | ORAL_TABLET | Freq: Every day | ORAL | 0 refills | Status: DC | PRN

## 2023-06-08 ENCOUNTER — Other Ambulatory Visit: Payer: Self-pay

## 2023-06-08 DIAGNOSIS — Z13220 Encounter for screening for lipoid disorders: Secondary | ICD-10-CM

## 2023-06-08 DIAGNOSIS — I10 Essential (primary) hypertension: Secondary | ICD-10-CM

## 2023-06-08 DIAGNOSIS — Z794 Long term (current) use of insulin: Secondary | ICD-10-CM

## 2023-06-08 DIAGNOSIS — G40909 Epilepsy, unspecified, not intractable, without status epilepticus: Secondary | ICD-10-CM

## 2023-06-08 MED ORDER — METFORMIN HCL ER 500 MG PO TB24
500.0000 mg | ORAL_TABLET | Freq: Two times a day (BID) | ORAL | 1 refills | Status: AC
Start: 2023-06-08 — End: ?

## 2023-06-08 MED ORDER — LISINOPRIL 10 MG PO TABS
10.0000 mg | ORAL_TABLET | Freq: Every day | ORAL | 0 refills | Status: DC
Start: 2023-06-08 — End: 2023-10-02

## 2023-06-08 MED ORDER — AMLODIPINE BESYLATE 10 MG PO TABS: 10.0000 mg | ORAL_TABLET | Freq: Every day | ORAL | 1 refills | Status: AC

## 2023-06-08 MED ORDER — TADALAFIL 10 MG PO TABS: 10.0000 mg | ORAL_TABLET | Freq: Every day | ORAL | 0 refills | Status: AC | PRN

## 2023-06-08 MED ORDER — LEVOCETIRIZINE DIHYDROCHLORIDE 5 MG PO TABS: 5.0000 mg | ORAL_TABLET | Freq: Every evening | ORAL | 2 refills | Status: AC

## 2023-06-08 MED ORDER — CLOTRIMAZOLE-BETAMETHASONE 1-0.05 % EX CREA: 1.0000 | TOPICAL_CREAM | Freq: Every day | CUTANEOUS | 0 refills | Status: AC

## 2023-06-08 MED ORDER — NOVOLOG FLEXPEN 100 UNIT/ML ~~LOC~~ SOPN: PEN_INJECTOR | SUBCUTANEOUS | 0 refills | Status: AC

## 2023-06-08 MED ORDER — BASAGLAR KWIKPEN 100 UNIT/ML ~~LOC~~ SOPN: 54.0000 [IU] | PEN_INJECTOR | Freq: Every day | SUBCUTANEOUS | 0 refills | Status: DC

## 2023-06-08 MED ORDER — ATORVASTATIN CALCIUM 20 MG PO TABS
20.0000 mg | ORAL_TABLET | Freq: Every day | ORAL | 1 refills | Status: DC
Start: 2023-06-08 — End: 2023-10-02

## 2023-06-08 MED ORDER — LEVETIRACETAM ER 500 MG PO TB24
500.0000 mg | ORAL_TABLET | Freq: Every day | ORAL | 4 refills | Status: AC
Start: 2023-06-08 — End: ?

## 2023-06-08 MED ORDER — GABAPENTIN 800 MG PO TABS: 800.0000 mg | ORAL_TABLET | Freq: Every day | ORAL | 0 refills | Status: AC

## 2023-06-08 NOTE — Telephone Encounter (Signed)
Please advise KH 

## 2023-06-08 NOTE — Telephone Encounter (Signed)
Done Kh 

## 2023-06-30 ENCOUNTER — Other Ambulatory Visit: Payer: Self-pay

## 2023-07-02 ENCOUNTER — Ambulatory Visit: Payer: Self-pay | Admitting: Nurse Practitioner

## 2023-07-09 ENCOUNTER — Ambulatory Visit: Payer: Self-pay | Admitting: Nurse Practitioner

## 2023-07-13 ENCOUNTER — Ambulatory Visit: Payer: BC Managed Care – PPO | Admitting: Podiatry

## 2023-10-02 ENCOUNTER — Other Ambulatory Visit: Payer: Self-pay | Admitting: Nurse Practitioner

## 2023-10-02 ENCOUNTER — Telehealth: Payer: Self-pay

## 2023-10-02 DIAGNOSIS — I10 Essential (primary) hypertension: Secondary | ICD-10-CM

## 2023-10-02 MED ORDER — SIMVASTATIN 20 MG PO TABS: 20.0000 mg | ORAL_TABLET | Freq: Every evening | ORAL | 11 refills | Status: AC

## 2023-10-02 NOTE — Telephone Encounter (Signed)
Done KH 

## 2023-10-02 NOTE — Telephone Encounter (Signed)
Pharmacy advise that pt insurance company prefers simvastantin. Crestor or pravastatin. Please advise [facility]

## 2023-10-23 ENCOUNTER — Other Ambulatory Visit: Payer: Self-pay

## 2023-10-23 ENCOUNTER — Emergency Department (HOSPITAL_BASED_OUTPATIENT_CLINIC_OR_DEPARTMENT_OTHER)
Admission: EM | Admit: 2023-10-23 | Discharge: 2023-10-23 | Disposition: A | Discharge status: Home / Self Care | Payer: Medicaid Other | Attending: Emergency Medicine | Admitting: Emergency Medicine

## 2023-10-23 ENCOUNTER — Encounter (HOSPITAL_BASED_OUTPATIENT_CLINIC_OR_DEPARTMENT_OTHER): Payer: Self-pay

## 2023-10-23 DIAGNOSIS — S91205A Unspecified open wound of left lesser toe(s) with damage to nail, initial encounter: Secondary | ICD-10-CM | POA: Insufficient documentation

## 2023-10-23 DIAGNOSIS — Z7982 Long term (current) use of aspirin: Secondary | ICD-10-CM | POA: Diagnosis not present

## 2023-10-23 DIAGNOSIS — Z794 Long term (current) use of insulin: Secondary | ICD-10-CM | POA: Insufficient documentation

## 2023-10-23 DIAGNOSIS — E119 Type 2 diabetes mellitus without complications: Secondary | ICD-10-CM | POA: Diagnosis not present

## 2023-10-23 DIAGNOSIS — S99922A Unspecified injury of left foot, initial encounter: Secondary | ICD-10-CM | POA: Diagnosis present

## 2023-10-23 DIAGNOSIS — Z7984 Long term (current) use of oral hypoglycemic drugs: Secondary | ICD-10-CM | POA: Insufficient documentation

## 2023-10-23 DIAGNOSIS — I10 Essential (primary) hypertension: Secondary | ICD-10-CM | POA: Insufficient documentation

## 2023-10-23 DIAGNOSIS — S91209A Unspecified open wound of unspecified toe(s) with damage to nail, initial encounter: Secondary | ICD-10-CM

## 2023-10-23 DIAGNOSIS — Z79899 Other long term (current) drug therapy: Secondary | ICD-10-CM | POA: Diagnosis not present

## 2023-10-23 DIAGNOSIS — X58XXXA Exposure to other specified factors, initial encounter: Secondary | ICD-10-CM | POA: Diagnosis not present

## 2023-10-23 MED ORDER — BACITRACIN ZINC 500 UNIT/GM EX OINT
TOPICAL_OINTMENT | Freq: Two times a day (BID) | CUTANEOUS | Status: DC
  Administered 2023-10-23: 31.5 via TOPICAL
  Filled 2023-10-23: qty 28.35

## 2023-10-23 NOTE — Discharge Instructions (Addendum)
Apply an antibiotic ointment such as bacitracin or Neosporin.  This should be applied twice a day.  After the antibiotic ointment is on, you should put a Band-Aid or some kind of dressing over the nailbed.  Return immediately if you develop any signs of infection.  If bleeding starts again, apply direct pressure for 5-10 minutes.  If bleeding cannot be controlled, you may return to the emergency department at any time.  You are cutting your nails way too close.  You are risking an infection by cutting the nails that close.  I recommend you see a podiatrist to get instructed on proper care of your toenails.  If you develop infection in your foot, it can lead to amputations.

## 2023-10-23 NOTE — ED Provider Notes (Signed)
[facility] Provider Note   CSN: [id number] Arrival date & time: 10/23/23  0135     History  Chief Complaint  Patient presents with   Toe Injury    LT 4th toe    Albert Garcia is a 52 y.o. male.  The history is provided by the patient.  He has history of hypertension, diabetes, seizure disorder and comes in after pulling a hangnail off of his left fourth toe.  It has been bleeding ever since, and he has been unable to stop it.  He does take daily aspirin but is not on no other anticoagulants or antiplatelet agents.   Home Medications Prior to Admission medications   Medication Sig Start Date End Date Taking? Authorizing Provider  amLODipine (NORVASC) 10 MG tablet Take 1 tablet (10 mg total) by mouth daily. 06/08/23   Ivonne Andrew, NP  aspirin EC 81 MG tablet Take 1 tablet (81 mg total) by mouth daily. 02/01/19   Marcine Matar, MD  blood glucose meter kit and supplies KIT Dispense based on patient and insurance preference. Use up to four times daily as directed. 07/23/22   Ivonne Andrew, NP  cholecalciferol (VITAMIN D-400) 10 MCG (400 UNIT) TABS tablet Take 1 tablet (400 Units total) by mouth daily. 04/01/23   Ivonne Andrew, NP  clotrimazole-betamethasone (LOTRISONE) cream Apply 1 Application topically daily. 06/08/23   Ivonne Andrew, NP  Continuous Blood Gluc Receiver (FREESTYLE LIBRE 14 DAY READER) DEVI 1 Device by Does not apply route continuous. Patient not taking: Reported on 10/17/2022 05/24/20 05/24/21  Barbette Merino, NP  gabapentin (NEURONTIN) 800 MG tablet Take 1 tablet (800 mg total) by mouth at bedtime. 06/08/23   Ivonne Andrew, NP  GARLIC PO Take 1 capsule by mouth daily.    [provider]  glucose blood (TRUE METRIX BLOOD GLUCOSE TEST) test strip Use as directed up to 4 (four) times daily. 07/31/22   Ivonne Andrew, NP  glucose blood test strip Use as instructed 3 times daily 01/13/18   Hoy Register, MD  guaiFENesin (MUCINEX) 600 MG 12 hr tablet Take 1 tablet (600 mg total) by mouth 2 (two) times daily as needed. 05/20/23   Ivonne Andrew, NP  Insulin Glargine (BASAGLAR KWIKPEN) 100 UNIT/ML Inject 54 Units into the skin at bedtime. 06/08/23   Ivonne Andrew, NP  Insulin Pen Needle (B-D UF III MINI PEN NEEDLES) 31G X 5 MM MISC USE AS DIRECTED 05/03/21   Barbette Merino, NP  levETIRAcetam (KEPPRA XR) 500 MG 24 hr tablet Take 1 tablet (500 mg total) by mouth daily. 06/08/23   Ivonne Andrew, NP  levocetirizine (XYZAL ALLERGY 24HR) 5 MG tablet Take 1 tablet (5 mg total) by mouth every evening. 06/08/23   Ivonne Andrew, NP  lisinopril (ZESTRIL) 10 MG tablet Take 1 tablet by mouth once daily 10/02/23   Ivonne Andrew, NP  metFORMIN (GLUCOPHAGE-XR) 500 MG 24 hr tablet Take 1 tablet (500 mg total) by mouth 2 (two) times daily with a meal. 06/08/23   Ivonne Andrew, NP  Multiple Vitamin (MULTIVITAMIN WITH MINERALS) TABS tablet Take 1 tablet by mouth daily. Reported on 11/13/2015    [provider]  NOVOLOG FLEXPEN 100 UNIT/ML FlexPen INJECT 28 UNITS SUBCUTANEOUSLY THREE TIMES DAILY WITH MEALS 06/08/23   Ivonne Andrew, NP  [facility] DELICA LANCETS 33G MISC 1 each by Does not apply route 3 (three)  times daily. 01/13/18   Hoy Register, MD  simvastatin (ZOCOR) 20 MG tablet Take 1 tablet (20 mg total) by mouth every evening. 10/02/23 10/01/24  Ivonne Andrew, NP  SUPER B COMPLEX/C PO Take 1 capsule by mouth daily.    [provider]  tadalafil (CIALIS) 10 MG tablet Take 1 tablet (10 mg total) by mouth daily as needed for erectile dysfunction. 06/08/23   Ivonne Andrew, NP  TRUEplus Lancets 28G MISC use as directed up to 4 times daily 09/29/22   Ivonne Andrew, NP  omeprazole (PRILOSEC) 20 MG capsule TAKE 1 CAPSULE (20 MG TOTAL) BY MOUTH DAILY. Patient not taking: Reported on 05/11/2020 07/12/19 05/11/20  Marcine Matar, MD  promethazine (PHENERGAN) 12.5 MG tablet Take 1  tablet (12.5 mg total) by mouth daily as needed for nausea or vomiting. Patient not taking: Reported on 05/11/2020 03/25/18 05/11/20  Marcine Matar, MD  sildenafil (VIAGRA) 50 MG tablet Take 0.5-1 tablets (25-50 mg total) by mouth daily as needed for erectile dysfunction. Patient not taking: Reported on 05/11/2020 12/14/17 05/11/20  Lizbeth Bark, FNP  sitaGLIPtin-metformin (JANUMET) 50-1000 MG tablet Take 1 tablet by mouth 2 (two) times daily with a meal. Patient not taking: Reported on 05/11/2020 04/16/20 05/11/20  Marcine Matar, MD      Allergies    Hctz [hydrochlorothiazide]    Review of Systems   Review of Systems  All other systems reviewed and are negative.   Physical Exam Updated Vital Signs BP 136/73 (BP Location: Right Arm)   Pulse 92   Temp 98.4 F (36.9 C) (Oral)   Resp 20   SpO2 99%  Physical Exam Vitals and nursing note reviewed.   52 year old male, resting comfortably and in no acute distress. Vital signs are normal. Oxygen saturation is 99%, which is normal. Head is normocephalic and atraumatic. PERRLA, EOMI. Lungs are clear without rales, wheezes, or rhonchi. Chest is nontender. Heart has regular rate and rhythm without murmur. Extremities: Clot is noted over the left fourth toenail.  When the clot is removed, it appears that he has pulled the piece of the nailbed off along with the hangnail.  All toenails are noted to be cut extremely close. Skin is warm and dry without rash. Neurologic: Mental status is normal, cranial nerves are intact, moves all extremities equally, has decree sensation of the feet consistent with diabetic peripheral neuropathy.       ED Results / Procedures / Treatments    Procedures Procedures    Medications Ordered in ED Medications - No data to display  ED Course/ Medical Decision Making/ A&P                                 Medical Decision Making Risk OTC drugs.   Bleeding from site were there is avulsion of  portion of the toenail and nailbed without current bleeding.  I have ordered a dressing be applied with bacitracin ointment.  I have explained to the patient the importance of not cutting his toenails so close in the setting of diabetic neuropathy, I have recommended that he see a podiatrist on a regular basis.  I am discharging him with instructions to apply antibiotic ointment and keep his toenail covered, return if he develops any signs of infection or recurrent bleeding.  Otherwise, follow-up with PCP in 3 days for recheck.  Final Clinical Impression(s) / ED Diagnoses Final  diagnoses:  Avulsion of toenail of left foot    Rx / DC Orders ED Discharge Orders     None         Dione Booze, MD 10/23/23 [phone]

## 2023-10-23 NOTE — ED Triage Notes (Signed)
Pt reports he pulled hang nail and his LT 4th toe won't stop bleeding. Pt is diabetic and states he is on blood thinners. Unable to find blood thinner med on file. Toe appears to have clotted at this time.

## 2023-10-28 ENCOUNTER — Other Ambulatory Visit: Payer: Self-pay | Admitting: Nurse Practitioner

## 2023-10-28 DIAGNOSIS — Z794 Long term (current) use of insulin: Secondary | ICD-10-CM

## 2023-11-06 ENCOUNTER — Encounter: Payer: Self-pay | Admitting: Pharmacist

## 2023-11-26 ENCOUNTER — Other Ambulatory Visit: Payer: Self-pay | Admitting: Nurse Practitioner

## 2023-11-26 DIAGNOSIS — Z794 Long term (current) use of insulin: Secondary | ICD-10-CM

## 2023-12-16 ENCOUNTER — Telehealth: Payer: Self-pay

## 2023-12-16 NOTE — Telephone Encounter (Signed)
Patient was identified as falling into the [place] Measure - Diabetes.   Patient was: stated he is with a new PCP.

## 2024-01-12 ENCOUNTER — Telehealth: Payer: Self-pay

## 2024-01-12 DIAGNOSIS — Z79899 Other long term (current) drug therapy: Secondary | ICD-10-CM

## 2024-01-12 NOTE — Progress Notes (Signed)
   01/12/2024  Patient ID: Albert Garcia, male   DOB: [birth year], 53 y.o.   MRN: [id number]  This patient was identified on the Diabetes [place] Metric report. Per chart review, patient receives care at [facility].   Thank you for allowing pharmacy to be a part of this patient's care. Cephus Shelling, PharmD Clinical Pharmacist Cell: [phone]

## 2024-11-21 ENCOUNTER — Other Ambulatory Visit: Payer: Self-pay

## 2024-11-22 ENCOUNTER — Other Ambulatory Visit: Payer: Self-pay
